# Patient Record
Sex: Male | Born: 1945
Health system: Southern US, Community
[De-identification: ages and names within clinical notes are randomized; demographics above are authoritative.]

## PROBLEM LIST (undated history)

## (undated) DIAGNOSIS — G629 Polyneuropathy, unspecified: Secondary | ICD-10-CM

## (undated) DIAGNOSIS — E871 Hypo-osmolality and hyponatremia: Secondary | ICD-10-CM

## (undated) DIAGNOSIS — I429 Cardiomyopathy, unspecified: Secondary | ICD-10-CM

## (undated) DIAGNOSIS — E119 Type 2 diabetes mellitus without complications: Secondary | ICD-10-CM

## (undated) DIAGNOSIS — K5790 Diverticulosis of intestine, part unspecified, without perforation or abscess without bleeding: Secondary | ICD-10-CM

## (undated) DIAGNOSIS — I219 Acute myocardial infarction, unspecified: Secondary | ICD-10-CM

## (undated) DIAGNOSIS — I700 Atherosclerosis of aorta: Secondary | ICD-10-CM

## (undated) DIAGNOSIS — I509 Heart failure, unspecified: Secondary | ICD-10-CM

## (undated) DIAGNOSIS — K2270 Barrett's esophagus without dysplasia: Secondary | ICD-10-CM

## (undated) DIAGNOSIS — I2510 Atherosclerotic heart disease of native coronary artery without angina pectoris: Secondary | ICD-10-CM

## (undated) DIAGNOSIS — K449 Diaphragmatic hernia without obstruction or gangrene: Secondary | ICD-10-CM

## (undated) HISTORY — DX: Cardiomyopathy, unspecified: I42.9

## (undated) HISTORY — DX: Diaphragmatic hernia without obstruction or gangrene: K44.9

## (undated) HISTORY — DX: Barrett's esophagus without dysplasia: K22.70

## (undated) HISTORY — DX: Type 2 diabetes mellitus without complications: E11.9

## (undated) HISTORY — DX: Atherosclerosis of aorta: I70.0

## (undated) HISTORY — DX: Hypo-osmolality and hyponatremia: E87.1

## (undated) HISTORY — PX: ESOPHAGOGASTRODUODENOSCOPY: SHX1529

## (undated) HISTORY — DX: Diverticulosis of intestine, part unspecified, without perforation or abscess without bleeding: K57.90

## (undated) HISTORY — PX: HERNIA REPAIR: SHX51

## (undated) HISTORY — DX: Heart failure, unspecified: I50.9

## (undated) HISTORY — DX: Polyneuropathy, unspecified: G62.9

## (undated) HISTORY — DX: Atherosclerotic heart disease of native coronary artery without angina pectoris: I25.10

## (undated) HISTORY — DX: Acute myocardial infarction, unspecified: I21.9

---

## 2017-12-07 ENCOUNTER — Emergency Department (HOSPITAL_COMMUNITY)
Admission: EM | Admit: 2017-12-07 | Discharge: 2017-12-07 | Disposition: A | Discharge status: Home / Self Care | Payer: No Typology Code available for payment source | Attending: Emergency Medicine | Admitting: Emergency Medicine

## 2017-12-07 ENCOUNTER — Other Ambulatory Visit: Payer: Self-pay

## 2017-12-07 DIAGNOSIS — W01198A Fall on same level from slipping, tripping and stumbling with subsequent striking against other object, initial encounter: Secondary | ICD-10-CM | POA: Diagnosis not present

## 2017-12-07 DIAGNOSIS — Y990 Civilian activity done for income or pay: Secondary | ICD-10-CM | POA: Insufficient documentation

## 2017-12-07 DIAGNOSIS — Y92038 Other place in apartment as the place of occurrence of the external cause: Secondary | ICD-10-CM | POA: Insufficient documentation

## 2017-12-07 DIAGNOSIS — Z23 Encounter for immunization: Secondary | ICD-10-CM | POA: Insufficient documentation

## 2017-12-07 DIAGNOSIS — W19XXXA Unspecified fall, initial encounter: Secondary | ICD-10-CM

## 2017-12-07 DIAGNOSIS — S0990XA Unspecified injury of head, initial encounter: Secondary | ICD-10-CM

## 2017-12-07 DIAGNOSIS — Y9389 Activity, other specified: Secondary | ICD-10-CM | POA: Insufficient documentation

## 2017-12-07 DIAGNOSIS — S01112A Laceration without foreign body of left eyelid and periocular area, initial encounter: Secondary | ICD-10-CM | POA: Diagnosis not present

## 2017-12-07 DIAGNOSIS — S098XXA Other specified injuries of head, initial encounter: Secondary | ICD-10-CM | POA: Diagnosis present

## 2017-12-07 MED ORDER — LIDOCAINE HCL (PF) 1 % IJ SOLN
5.0000 mL | Freq: Once | INTRAMUSCULAR | Status: AC
  Administered 2017-12-07: 5 mL
  Filled 2017-12-07: qty 30

## 2017-12-07 MED ORDER — TETANUS-DIPHTH-ACELL PERTUSSIS 5-2.5-18.5 LF-MCG/0.5 IM SUSP
0.5000 mL | Freq: Once | INTRAMUSCULAR | Status: AC
  Administered 2017-12-07: 0.5 mL via INTRAMUSCULAR
  Filled 2017-12-07: qty 0.5

## 2017-12-07 NOTE — ED Triage Notes (Signed)
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 Pt has a laceration to his left eyebrow. Pt denies LOC. PT reports no pertinent medical hx, no medications, and no allergies.  Pt reports no pain, but laceration is deep. Bleeding is controlled at this time.

## 2017-12-07 NOTE — ED Provider Notes (Signed)
[facility] DEPT Provider Note   CSN: [id number] Arrival date & time: 12/07/17  1859     History   Chief Complaint Chief Complaint  Patient presents with  . Fall    HPI Aaron Rush is a 72 y.o. male with no significant past medical history and on no daily medication who presents emergency room today for mechanical fall that occurred approximately 3 hours ago.  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  He denies any loss of consciousness.  No nausea or vomiting since the event.  Patient is not on any blood thinners.  Patient denies any headache, visual changes, facial droop, numbness/tingling/weakness of the extremities, neck pain, bowel/bladder incontinence, urinary retention, difficulty with gait.  He denies history of intracranial hemorrhage.  He is noted to have a laceration to the left upper eyebrow that is roughly 2 cm without measurement.  Bleeding is currently controlled.  He notes that he cleansed the area prior to arrival.  The area was patched with gauze.  His tetanus is not up-to-date.  HPI  No past medical history on file.  There are no active problems to display for this patient.   The histories are not reviewed yet. Please review them in the "History" navigator section and refresh this [place].     Home Medications    Prior to Admission medications   Not on File    Family History No family history on file.  Social History Social History   Tobacco Use  . Smoking status: Not on file  Substance Use Topics  . Alcohol use: Not on file  . Drug use: Not on file     Allergies   Patient has no known allergies.   Review of Systems Review of Systems  All other systems reviewed and are negative.    Physical Exam Updated Vital  Signs BP (!) 148/99 (BP Location: Left Arm)   Pulse (!) 109   Temp 98.4 F (36.9 C) (Oral)   Resp 16   Ht 5\' 11"  (1.803 m)   Wt 59 kg (130 lb)   SpO2 99%   BMI 18.13 kg/m   Physical Exam  Constitutional: He appears well-developed and well-nourished.  HENT:  Head: Normocephalic and atraumatic.  Right Ear: External ear normal.  Left Ear: External ear normal.  Patient is a 2.25 cm laceration above the left upper eyebrow.  Bleeding is currently controlled.  No raccoon eyes or battle signs.  No CSF otorrhea.  No hemotympanum.  No palpable open or depressed skull fractures.  Eyes: Conjunctivae are normal. Right eye exhibits no discharge. Left eye exhibits no discharge. No scleral icterus.  Extra ocular movements are intact without evidence of entrapment  Neck:  No C-spine tenderness palpation or step-offs.  Pulmonary/Chest: Effort normal. No respiratory distress.  Neurological: He is alert.  Mental Status:  Alert, oriented, thought content appropriate, able to give a coherent history. Speech fluent without evidence of aphasia. Able to follow 2 step commands without difficulty.  Cranial Nerves:  II:  Peripheral visual fields grossly normal, pupils equal, round, reactive to light III,IV, VI: ptosis not present, extra-ocular motions intact bilaterally  V,VII: smile symmetric, eyebrows raise symmetric, facial light touch sensation equal VIII: hearing grossly normal to voice  X: uvula elevates symmetrically  XI: bilateral shoulder shrug symmetric and strong XII: midline tongue extension without fassiculations Motor:  Normal tone. 5/5 in upper and lower extremities bilaterally including strong and equal grip strength and dorsiflexion/plantar flexion Sensory: Sensation intact to light touch in all extremities. Negative Romberg.  Deep Tendon Reflexes: 2+ and symmetric in the biceps and patella Cerebellar: normal finger-to-nose with bilateral upper extremities. Normal heel-to -shin balance  bilaterally of the lower extremity. No pronator drift.  Gait: normal gait and balance CV: distal pulses palpable throughout   Skin: Skin is warm and dry. Capillary refill takes less than 2 seconds. Laceration noted. No pallor.  Psychiatric: He has a normal mood and affect.  Nursing note and vitals reviewed.      ED Treatments / Results  Labs (all labs ordered are listed, but only abnormal results are displayed) Labs Reviewed - No data to display  EKG None  Radiology No results found.  Procedures .Marland KitchenLaceration Repair Date/Time: 12/07/2017 9:44 PM Performed by: Jillyn Ledger, PA-C Authorized by: Jillyn Ledger, PA-C   Consent:    Consent obtained:  Verbal   Consent given by:  Patient   Risks discussed:  Infection, need for additional repair, nerve damage, poor wound healing, poor cosmetic result, pain, retained foreign body, tendon damage and vascular damage   Alternatives discussed:  No treatment Anesthesia (see MAR for exact dosages):    Anesthesia method:  Local infiltration   Local anesthetic:  Lidocaine 1% w/o epi Laceration details:    Location:  Face   Face location:  L eyebrow   Length (cm):  2.3 Repair type:    Repair type:  Simple Pre-procedure details:    Preparation:  Patient was prepped and draped in usual sterile fashion Treatment:    Area cleansed with:  Saline   Amount of cleaning:  Standard   Irrigation solution:  Sterile saline   Irrigation volume:  200   Irrigation method:  Syringe   Visualized foreign bodies/material removed: no   Skin repair:    Repair method:  Sutures   Suture size:  6-0   Suture material:  Prolene   Suture technique:  Simple interrupted   Number of sutures:  3 Approximation:    Approximation:  Close Post-procedure details:    Dressing:  Open (no dressing)   Patient tolerance of procedure:  Tolerated well, no immediate complications   (including critical care time)  Medications Ordered in ED Medications    lidocaine (PF) (XYLOCAINE) 1 % injection 5 mL (has no administration in time range)  Tdap (BOOSTRIX) injection 0.5 mL (has no administration in time range)     Initial Impression / Assessment and Plan / ED Course  I have reviewed the triage vital signs and the nursing notes.  Pertinent labs & imaging results that were available during my care of the patient were reviewed by me and considered in my medical decision making (see chart for details).     72 y.o. male sitting with mechanical fall today in which she hit the left side of his head and has a 2.25 cm laceration above the left eyebrow was seen above.  Patient denies loss of consciousness.  He is without headache, double vision, nausea/vomiting, vertigo, focal weakness or confusion since the event.  He is not on any blood thinners.  He denies prior intracranial injuries.  Patient is with normal neurologic exam as above.  Do not feel patient needs CT scan in order to evaluate for intracranial pathology. Pressure irrigation performed.  Wound explored and base of wound visualized in a bloodless field without evidence of foreign body.  Laceration occurred < 8 hours prior to repair which was well tolerated.  Tdap updated. Discussed suture home care with patient and answered questions. Pt to follow-up for wound check and suture removal in 7 days; they are to return to the ED sooner for signs of infection. Pt is hemodynamically stable with no complaints prior to dc.  Specific return precautions discussed. Time was given for all questions to be answered. The patient verbalized understanding and agreement with plan. The patient appears safe for discharge home.  Patient case seen and discussed with Dr. Sherry Ruffing who is in agreement with plan.   Final Clinical Impressions(s) / ED Diagnoses   Final diagnoses:  Fall, initial encounter  Injury of head, initial encounter  Laceration of left eyebrow, initial encounter    ED Discharge Orders    None        Lorelle Gibbs 12/07/17 2148    Tegeler, Gwenyth Allegra, MD 12/08/17 [phone]

## 2017-12-07 NOTE — Discharge Instructions (Signed)
Please read and follow all provided instructions.  Your diagnoses today is a laceration. A laceration is a cut or lesion that goes through all layers of the skin and into the tissue just beneath the skin. This was repaired with 3 stitches or a tissue adhesive similar to a super glue.  Follow up with your doctor, an urgent care, or this Emergency Department for removal of your stitches in 7-10 days. Keep the wound clean and dry for the next 24 hours and leave the dressing in place. You may shower after 24 hours. Do not soak the area for long periods of times as in a bath until the sutures are removed. After 24 hours you may remove the dressing and gently clean the laceration site with antibacterial soap (i.e. Neosporin or Bacitracin) and warm water 2 times a day. Pat dry with clean towel. Do not scrub. Once the wound has healed, scarring can be minimized by covering the wound with sunscreen during the day for 1 full year.  Return instructions:  You have redness, swelling, or increasing pain in the wound.  You see a red line that goes away from the wound.  You have yellowish-white fluid (pus) coming from the wound.  You have a fever (above 100.53F) You notice a bad smell coming from the wound or dressing.  Your wound breaks open before or after sutures have been removed.  You notice something coming out of the wound such as wood or glass.  Your wound is on your hand or foot and you cannot move a finger or toe.  Your pain is not controlled with prescribed medicine.   Additional Information:  If you did not receive a tetanus shot today because you thought you were up to date, but did not recall when your last one was given, make sure to check with your primary caregiver to determine if you need one.   Your vital signs today were: BP (!) 176/99 (BP Location: Left Arm)    Pulse 97    Temp 98.4 F (36.9 C) (Oral)    Resp 15    Ht 5\' 11"  (1.803 m)    Wt 59 kg (130 lb)    SpO2 98%    BMI 18.13 kg/m  If  your blood pressure (BP) was elevated above 135/85 this visit, please have this repeated by your doctor within one month.

## 2018-08-04 DIAGNOSIS — E119 Type 2 diabetes mellitus without complications: Secondary | ICD-10-CM | POA: Diagnosis not present

## 2018-08-04 DIAGNOSIS — D649 Anemia, unspecified: Secondary | ICD-10-CM | POA: Diagnosis not present

## 2018-08-04 DIAGNOSIS — E559 Vitamin D deficiency, unspecified: Secondary | ICD-10-CM | POA: Diagnosis not present

## 2018-08-04 DIAGNOSIS — R972 Elevated prostate specific antigen [PSA]: Secondary | ICD-10-CM | POA: Diagnosis not present

## 2018-08-04 DIAGNOSIS — D519 Vitamin B12 deficiency anemia, unspecified: Secondary | ICD-10-CM | POA: Diagnosis not present

## 2018-08-04 DIAGNOSIS — E291 Testicular hypofunction: Secondary | ICD-10-CM | POA: Diagnosis not present

## 2018-09-28 DIAGNOSIS — E291 Testicular hypofunction: Secondary | ICD-10-CM | POA: Diagnosis not present

## 2018-09-28 DIAGNOSIS — R972 Elevated prostate specific antigen [PSA]: Secondary | ICD-10-CM | POA: Diagnosis not present

## 2018-12-07 DIAGNOSIS — R972 Elevated prostate specific antigen [PSA]: Secondary | ICD-10-CM | POA: Diagnosis not present

## 2018-12-07 DIAGNOSIS — E291 Testicular hypofunction: Secondary | ICD-10-CM | POA: Diagnosis not present

## 2019-02-02 DIAGNOSIS — R972 Elevated prostate specific antigen [PSA]: Secondary | ICD-10-CM | POA: Diagnosis not present

## 2019-02-02 DIAGNOSIS — E291 Testicular hypofunction: Secondary | ICD-10-CM | POA: Diagnosis not present

## 2019-05-27 DIAGNOSIS — D519 Vitamin B12 deficiency anemia, unspecified: Secondary | ICD-10-CM | POA: Diagnosis not present

## 2019-05-27 DIAGNOSIS — E559 Vitamin D deficiency, unspecified: Secondary | ICD-10-CM | POA: Diagnosis not present

## 2019-05-27 DIAGNOSIS — R972 Elevated prostate specific antigen [PSA]: Secondary | ICD-10-CM | POA: Diagnosis not present

## 2019-05-27 DIAGNOSIS — E785 Hyperlipidemia, unspecified: Secondary | ICD-10-CM | POA: Diagnosis not present

## 2019-05-27 DIAGNOSIS — E119 Type 2 diabetes mellitus without complications: Secondary | ICD-10-CM | POA: Diagnosis not present

## 2019-05-27 DIAGNOSIS — E291 Testicular hypofunction: Secondary | ICD-10-CM | POA: Diagnosis not present

## 2019-07-15 DIAGNOSIS — I639 Cerebral infarction, unspecified: Secondary | ICD-10-CM

## 2019-07-15 HISTORY — DX: Cerebral infarction, unspecified: I63.9

## 2019-08-04 DIAGNOSIS — E291 Testicular hypofunction: Secondary | ICD-10-CM | POA: Diagnosis not present

## 2019-09-29 DIAGNOSIS — E785 Hyperlipidemia, unspecified: Secondary | ICD-10-CM | POA: Diagnosis not present

## 2019-09-29 DIAGNOSIS — E291 Testicular hypofunction: Secondary | ICD-10-CM | POA: Diagnosis not present

## 2019-09-29 DIAGNOSIS — R972 Elevated prostate specific antigen [PSA]: Secondary | ICD-10-CM | POA: Diagnosis not present

## 2019-11-23 DIAGNOSIS — R972 Elevated prostate specific antigen [PSA]: Secondary | ICD-10-CM | POA: Diagnosis not present

## 2019-11-23 DIAGNOSIS — E291 Testicular hypofunction: Secondary | ICD-10-CM | POA: Diagnosis not present

## 2020-01-25 DIAGNOSIS — Z131 Encounter for screening for diabetes mellitus: Secondary | ICD-10-CM | POA: Diagnosis not present

## 2020-01-25 DIAGNOSIS — Z1159 Encounter for screening for other viral diseases: Secondary | ICD-10-CM | POA: Diagnosis not present

## 2020-01-25 DIAGNOSIS — R0602 Shortness of breath: Secondary | ICD-10-CM | POA: Diagnosis not present

## 2020-01-25 DIAGNOSIS — R5383 Other fatigue: Secondary | ICD-10-CM | POA: Diagnosis not present

## 2020-01-25 DIAGNOSIS — E7800 Pure hypercholesterolemia, unspecified: Secondary | ICD-10-CM | POA: Diagnosis not present

## 2020-01-25 DIAGNOSIS — Z0000 Encounter for general adult medical examination without abnormal findings: Secondary | ICD-10-CM | POA: Diagnosis not present

## 2020-01-25 DIAGNOSIS — M7989 Other specified soft tissue disorders: Secondary | ICD-10-CM | POA: Diagnosis not present

## 2020-01-25 DIAGNOSIS — R9431 Abnormal electrocardiogram [ECG] [EKG]: Secondary | ICD-10-CM | POA: Diagnosis not present

## 2020-01-25 DIAGNOSIS — Z79899 Other long term (current) drug therapy: Secondary | ICD-10-CM | POA: Diagnosis not present

## 2020-01-25 DIAGNOSIS — E559 Vitamin D deficiency, unspecified: Secondary | ICD-10-CM | POA: Diagnosis not present

## 2020-01-25 DIAGNOSIS — M129 Arthropathy, unspecified: Secondary | ICD-10-CM | POA: Diagnosis not present

## 2020-01-27 DIAGNOSIS — R0989 Other specified symptoms and signs involving the circulatory and respiratory systems: Secondary | ICD-10-CM | POA: Diagnosis not present

## 2020-01-27 DIAGNOSIS — M79604 Pain in right leg: Secondary | ICD-10-CM | POA: Diagnosis not present

## 2020-01-27 DIAGNOSIS — M79605 Pain in left leg: Secondary | ICD-10-CM | POA: Diagnosis not present

## 2020-02-02 DIAGNOSIS — R918 Other nonspecific abnormal finding of lung field: Secondary | ICD-10-CM | POA: Diagnosis not present

## 2020-02-02 DIAGNOSIS — E7800 Pure hypercholesterolemia, unspecified: Secondary | ICD-10-CM | POA: Diagnosis not present

## 2020-02-02 DIAGNOSIS — E559 Vitamin D deficiency, unspecified: Secondary | ICD-10-CM | POA: Diagnosis not present

## 2020-02-02 DIAGNOSIS — I5021 Acute systolic (congestive) heart failure: Secondary | ICD-10-CM | POA: Diagnosis not present

## 2020-02-16 DIAGNOSIS — I5021 Acute systolic (congestive) heart failure: Secondary | ICD-10-CM | POA: Diagnosis not present

## 2020-02-16 DIAGNOSIS — M7989 Other specified soft tissue disorders: Secondary | ICD-10-CM | POA: Diagnosis not present

## 2020-02-16 DIAGNOSIS — R918 Other nonspecific abnormal finding of lung field: Secondary | ICD-10-CM | POA: Diagnosis not present

## 2020-02-17 DIAGNOSIS — R972 Elevated prostate specific antigen [PSA]: Secondary | ICD-10-CM | POA: Diagnosis not present

## 2020-02-17 DIAGNOSIS — E291 Testicular hypofunction: Secondary | ICD-10-CM | POA: Diagnosis not present

## 2020-02-23 DIAGNOSIS — R918 Other nonspecific abnormal finding of lung field: Secondary | ICD-10-CM | POA: Diagnosis not present

## 2020-03-08 DIAGNOSIS — I429 Cardiomyopathy, unspecified: Secondary | ICD-10-CM | POA: Diagnosis not present

## 2020-03-08 DIAGNOSIS — J90 Pleural effusion, not elsewhere classified: Secondary | ICD-10-CM | POA: Diagnosis not present

## 2020-03-23 DIAGNOSIS — I429 Cardiomyopathy, unspecified: Secondary | ICD-10-CM | POA: Diagnosis not present

## 2020-03-23 DIAGNOSIS — I5021 Acute systolic (congestive) heart failure: Secondary | ICD-10-CM | POA: Diagnosis not present

## 2020-04-05 DIAGNOSIS — J90 Pleural effusion, not elsewhere classified: Secondary | ICD-10-CM | POA: Diagnosis not present

## 2020-04-05 DIAGNOSIS — I429 Cardiomyopathy, unspecified: Secondary | ICD-10-CM | POA: Diagnosis not present

## 2020-04-11 DIAGNOSIS — E039 Hypothyroidism, unspecified: Secondary | ICD-10-CM | POA: Diagnosis not present

## 2020-04-11 DIAGNOSIS — E119 Type 2 diabetes mellitus without complications: Secondary | ICD-10-CM | POA: Diagnosis not present

## 2020-04-11 DIAGNOSIS — Z131 Encounter for screening for diabetes mellitus: Secondary | ICD-10-CM | POA: Diagnosis not present

## 2020-04-11 DIAGNOSIS — R972 Elevated prostate specific antigen [PSA]: Secondary | ICD-10-CM | POA: Diagnosis not present

## 2020-04-11 DIAGNOSIS — D519 Vitamin B12 deficiency anemia, unspecified: Secondary | ICD-10-CM | POA: Diagnosis not present

## 2020-04-11 DIAGNOSIS — R861 Abnormal level of hormones in specimens from male genital organs: Secondary | ICD-10-CM | POA: Diagnosis not present

## 2020-04-11 DIAGNOSIS — E559 Vitamin D deficiency, unspecified: Secondary | ICD-10-CM | POA: Diagnosis not present

## 2020-04-11 DIAGNOSIS — E291 Testicular hypofunction: Secondary | ICD-10-CM | POA: Diagnosis not present

## 2020-04-17 DIAGNOSIS — I5021 Acute systolic (congestive) heart failure: Secondary | ICD-10-CM | POA: Diagnosis not present

## 2020-04-17 DIAGNOSIS — Z23 Encounter for immunization: Secondary | ICD-10-CM | POA: Diagnosis not present

## 2020-04-17 DIAGNOSIS — Z79899 Other long term (current) drug therapy: Secondary | ICD-10-CM | POA: Diagnosis not present

## 2020-04-17 DIAGNOSIS — Z131 Encounter for screening for diabetes mellitus: Secondary | ICD-10-CM | POA: Diagnosis not present

## 2020-04-17 DIAGNOSIS — E7800 Pure hypercholesterolemia, unspecified: Secondary | ICD-10-CM | POA: Diagnosis not present

## 2020-05-07 DIAGNOSIS — R29898 Other symptoms and signs involving the musculoskeletal system: Secondary | ICD-10-CM | POA: Diagnosis not present

## 2020-05-07 DIAGNOSIS — J90 Pleural effusion, not elsewhere classified: Secondary | ICD-10-CM | POA: Diagnosis not present

## 2020-05-07 DIAGNOSIS — I429 Cardiomyopathy, unspecified: Secondary | ICD-10-CM | POA: Diagnosis not present

## 2020-05-10 ENCOUNTER — Encounter: Payer: Self-pay | Admitting: Neurology

## 2020-05-17 ENCOUNTER — Encounter (HOSPITAL_BASED_OUTPATIENT_CLINIC_OR_DEPARTMENT_OTHER): Payer: Self-pay | Admitting: *Deleted

## 2020-05-17 ENCOUNTER — Emergency Department (HOSPITAL_BASED_OUTPATIENT_CLINIC_OR_DEPARTMENT_OTHER): Payer: Medicare HMO

## 2020-05-17 ENCOUNTER — Emergency Department (HOSPITAL_BASED_OUTPATIENT_CLINIC_OR_DEPARTMENT_OTHER)
Admission: EM | Admit: 2020-05-17 | Discharge: 2020-05-17 | Disposition: A | Discharge status: Home / Self Care | Payer: Medicare HMO | Attending: Emergency Medicine | Admitting: Emergency Medicine

## 2020-05-17 ENCOUNTER — Other Ambulatory Visit: Payer: Self-pay

## 2020-05-17 DIAGNOSIS — R911 Solitary pulmonary nodule: Secondary | ICD-10-CM | POA: Diagnosis not present

## 2020-05-17 DIAGNOSIS — R4182 Altered mental status, unspecified: Secondary | ICD-10-CM | POA: Diagnosis not present

## 2020-05-17 DIAGNOSIS — N390 Urinary tract infection, site not specified: Secondary | ICD-10-CM

## 2020-05-17 DIAGNOSIS — K209 Esophagitis, unspecified without bleeding: Secondary | ICD-10-CM | POA: Diagnosis not present

## 2020-05-17 DIAGNOSIS — R1110 Vomiting, unspecified: Secondary | ICD-10-CM | POA: Diagnosis not present

## 2020-05-17 DIAGNOSIS — I639 Cerebral infarction, unspecified: Secondary | ICD-10-CM | POA: Diagnosis not present

## 2020-05-17 DIAGNOSIS — R69 Illness, unspecified: Secondary | ICD-10-CM | POA: Diagnosis not present

## 2020-05-17 DIAGNOSIS — R140 Abdominal distension (gaseous): Secondary | ICD-10-CM | POA: Diagnosis not present

## 2020-05-17 DIAGNOSIS — F0390 Unspecified dementia without behavioral disturbance: Secondary | ICD-10-CM | POA: Insufficient documentation

## 2020-05-17 DIAGNOSIS — R9431 Abnormal electrocardiogram [ECG] [EKG]: Secondary | ICD-10-CM | POA: Diagnosis not present

## 2020-05-17 DIAGNOSIS — R531 Weakness: Secondary | ICD-10-CM | POA: Diagnosis not present

## 2020-05-17 DIAGNOSIS — B9689 Other specified bacterial agents as the cause of diseases classified elsewhere: Secondary | ICD-10-CM | POA: Diagnosis not present

## 2020-05-17 DIAGNOSIS — K449 Diaphragmatic hernia without obstruction or gangrene: Secondary | ICD-10-CM | POA: Insufficient documentation

## 2020-05-17 DIAGNOSIS — I700 Atherosclerosis of aorta: Secondary | ICD-10-CM | POA: Diagnosis not present

## 2020-05-17 DIAGNOSIS — R630 Anorexia: Secondary | ICD-10-CM | POA: Diagnosis not present

## 2020-05-17 DIAGNOSIS — I509 Heart failure, unspecified: Secondary | ICD-10-CM | POA: Insufficient documentation

## 2020-05-17 DIAGNOSIS — Z87891 Personal history of nicotine dependence: Secondary | ICD-10-CM | POA: Insufficient documentation

## 2020-05-17 DIAGNOSIS — J984 Other disorders of lung: Secondary | ICD-10-CM | POA: Diagnosis not present

## 2020-05-17 DIAGNOSIS — R42 Dizziness and giddiness: Secondary | ICD-10-CM | POA: Diagnosis not present

## 2020-05-17 LAB — CBC WITH DIFFERENTIAL/PLATELET
Abs Immature Granulocytes: 0.03 10*3/uL (ref 0.00–0.07)
Basophils Absolute: 0 10*3/uL (ref 0.0–0.1)
Basophils Relative: 0 %
Eosinophils Absolute: 0.1 10*3/uL (ref 0.0–0.5)
Eosinophils Relative: 1 %
HCT: 44.6 % (ref 39.0–52.0)
Hemoglobin: 15.6 g/dL (ref 13.0–17.0)
Immature Granulocytes: 0 %
Lymphocytes Relative: 15 %
Lymphs Abs: 1.3 10*3/uL (ref 0.7–4.0)
MCH: 33.8 pg (ref 26.0–34.0)
MCHC: 35 g/dL (ref 30.0–36.0)
MCV: 96.7 fL (ref 80.0–100.0)
Monocytes Absolute: 0.8 10*3/uL (ref 0.1–1.0)
Monocytes Relative: 9 %
Neutro Abs: 6.6 10*3/uL (ref 1.7–7.7)
Neutrophils Relative %: 75 %
Platelets: 221 10*3/uL (ref 150–400)
RBC: 4.61 MIL/uL (ref 4.22–5.81)
RDW: 13.7 % (ref 11.5–15.5)
WBC: 8.9 10*3/uL (ref 4.0–10.5)
nRBC: 0 % (ref 0.0–0.2)

## 2020-05-17 LAB — URINALYSIS, ROUTINE W REFLEX MICROSCOPIC
Bilirubin Urine: NEGATIVE
Glucose, UA: NEGATIVE mg/dL
Ketones, ur: NEGATIVE mg/dL
Nitrite: NEGATIVE
Protein, ur: NEGATIVE mg/dL
Specific Gravity, Urine: 1.01 (ref 1.005–1.030)
pH: 6.5 (ref 5.0–8.0)

## 2020-05-17 LAB — COMPREHENSIVE METABOLIC PANEL
ALT: 19 U/L (ref 0–44)
AST: 19 U/L (ref 15–41)
Albumin: 3.6 g/dL (ref 3.5–5.0)
Alkaline Phosphatase: 63 U/L (ref 38–126)
Anion gap: 9 (ref 5–15)
BUN: 16 mg/dL (ref 8–23)
CO2: 23 mmol/L (ref 22–32)
Calcium: 8.9 mg/dL (ref 8.9–10.3)
Chloride: 104 mmol/L (ref 98–111)
Creatinine, Ser: 0.9 mg/dL (ref 0.61–1.24)
GFR, Estimated: >60 mL/min (ref >60)
Glucose, Bld: 156 mg/dL — ABNORMAL HIGH (ref 70–99)
Potassium: 3.6 mmol/L (ref 3.5–5.1)
Sodium: 136 mmol/L (ref 135–145)
Total Bilirubin: 1.2 mg/dL (ref 0.3–1.2)
Total Protein: 6.7 g/dL (ref 6.5–8.1)

## 2020-05-17 LAB — TSH: TSH: 3.257 u[IU]/mL (ref 0.350–4.500)

## 2020-05-17 LAB — CK: Total CK: 88 U/L (ref 49–397)

## 2020-05-17 LAB — URINALYSIS, MICROSCOPIC (REFLEX): WBC, UA: >50 WBC/hpf (ref 0–5)

## 2020-05-17 LAB — TROPONIN I (HIGH SENSITIVITY): Troponin I (High Sensitivity): 10 ng/L (ref <18)

## 2020-05-17 MED ORDER — IOHEXOL 350 MG/ML SOLN
100.0000 mL | Freq: Once | INTRAVENOUS | Status: AC | PRN
  Administered 2020-05-17: 100 mL via INTRAVENOUS

## 2020-05-17 MED ORDER — SODIUM CHLORIDE 0.9 % IV SOLN
1.0000 g | Freq: Once | INTRAVENOUS | Status: AC
  Administered 2020-05-17: 1 g via INTRAVENOUS
  Filled 2020-05-17: qty 10

## 2020-05-17 MED ORDER — CEPHALEXIN 500 MG PO CAPS: 500.0000 mg | ORAL_CAPSULE | Freq: Three times a day (TID) | ORAL | 0 refills | Status: DC

## 2020-05-17 MED ORDER — PANTOPRAZOLE SODIUM 40 MG PO TBEC
40.0000 mg | DELAYED_RELEASE_TABLET | Freq: Once | ORAL | Status: AC
  Administered 2020-05-17: 40 mg via ORAL
  Filled 2020-05-17: qty 1

## 2020-05-17 MED ORDER — SODIUM CHLORIDE 0.9 % IV BOLUS
1000.0000 mL | Freq: Once | INTRAVENOUS | Status: AC
  Administered 2020-05-17: 1000 mL via INTRAVENOUS

## 2020-05-17 MED ORDER — PANTOPRAZOLE SODIUM 20 MG PO TBEC: 20.0000 mg | DELAYED_RELEASE_TABLET | Freq: Every day | ORAL | 0 refills | Status: DC

## 2020-05-17 NOTE — ED Notes (Signed)
Pt on monitor 

## 2020-05-17 NOTE — ED Triage Notes (Signed)
C/o generalized weakness x 4 months

## 2020-05-17 NOTE — ED Notes (Signed)
Discharge instructions discussed with patient and his wife. Both verbalize understanding of follow up care and prescriptions. Depart ED at this time in stable condition.

## 2020-05-17 NOTE — ED Notes (Signed)
Patient transported to CT 

## 2020-05-17 NOTE — ED Provider Notes (Signed)
[facility] EMERGENCY DEPARTMENT Provider Note   CSN: [id number] Arrival date & time: 05/17/20  1557     History Chief Complaint  Patient presents with  . Weakness    Champion Corales is a 74 y.o. male hx CHF here presenting with diffuse weakness.  Patient states that he has been progressively weaker for the last 4 months or so.  He states that he started with just some mild weakness in his legs now progressed to the point that he has to walk with walkers.  He states that he was afraid of driving yesterday because of his lack of coordination. He denies any falls or head injury.  Denies any trouble speaking or arm weakness.  Patient has about a 10 pound weight loss as well.  He states that he just has poor appetite as well.  Patient saw cardiology several months ago and was told he has heart failure and was briefly on Lasix but no longer on diuretics.  Patient saw his primary care doctor today and was sent in for further evaluation.  Patient of note has an appointment with neurology in 2 weeks.  The history is provided by the patient.       History reviewed. No pertinent past medical history.  There are no problems to display for this patient.   Past Surgical History:  Procedure Laterality Date  . HERNIA REPAIR         No family history on file.  Social History   Tobacco Use  . Smoking status: Former Smoker  Substance Use Topics  . Alcohol use: Not Currently    Comment: stopped 1982  . Drug use: Not Currently    Home Medications Prior to Admission medications   Medication Sig Start Date End Date Taking? Authorizing Provider  carvedilol (COREG) 3.125 MG tablet Take 3.125 mg by mouth 2 (two) times daily. 03/08/20   [provider]  [employer] CALCIUM 600 MG tablet Take 1 tablet by mouth 2 (two) times daily. 04/05/20   [provider]  ENTRESTO 24-26 MG Take 1 tablet by mouth 2 (two) times daily. 05/14/20   [provider]  furosemide (LASIX)  20 MG tablet Take 20 mg by mouth daily. 04/05/20   [provider]  potassium chloride (KLOR-CON) 10 MEQ tablet Take 10 mEq by mouth 2 (two) times daily. 03/01/20   [provider]    Allergies    Patient has no known allergies.  Review of Systems   Review of Systems  Neurological: Positive for weakness.  All other systems reviewed and are negative.   Physical Exam Updated Vital Signs BP (!) 157/105 (BP Location: Right Arm)   Pulse 90   Temp 98 F (36.7 C) (Oral)   Resp 16   Ht 5\' 11"  (1.803 m)   Wt 59 kg   SpO2 100%   BMI 18.13 kg/m   Physical Exam Vitals and nursing note reviewed.  Constitutional:      Appearance: Normal appearance.     Comments: Chronically ill  HENT:     Head: Normocephalic.     Right Ear: Tympanic membrane normal.     Left Ear: Tympanic membrane normal.     Nose: Nose normal.     Mouth/Throat:     Mouth: Mucous membranes are moist.  Eyes:     Extraocular Movements: Extraocular movements intact.     Pupils: Pupils are equal, round, and reactive to light.  Cardiovascular:     Rate and Rhythm:  Normal rate and regular rhythm.     Pulses: Normal pulses.     Heart sounds: Normal heart sounds.  Pulmonary:     Effort: Pulmonary effort is normal.     Breath sounds: Normal breath sounds.  Abdominal:     General: Abdomen is flat.     Palpations: Abdomen is soft.  Musculoskeletal:        General: Normal range of motion.     Cervical back: Normal range of motion and neck supple.     Comments: Bilateral calves with some muscle atrophy.  No calf tenderness.  Skin:    General: Skin is warm.     Capillary Refill: Capillary refill takes less than 2 seconds.  Neurological:     General: No focal deficit present.     Mental Status: He is alert.     Comments: Strength is 4 out of 5 bilateral hip flexion extension and knee flexion and extension.  Patient has normal reflexes in the knees.  Patient has normal sensation throughout.  Patient has  normal arm strength bilaterally.  There is no facial droop.  Psychiatric:        Mood and Affect: Mood normal.        Behavior: Behavior normal.     ED Results / Procedures / Treatments   Labs (all labs ordered are listed, but only abnormal results are displayed) Labs Reviewed  CBC WITH DIFFERENTIAL/PLATELET  COMPREHENSIVE METABOLIC PANEL  URINALYSIS, ROUTINE W REFLEX MICROSCOPIC  TSH  CK  TROPONIN I (HIGH SENSITIVITY)    EKG EKG Interpretation  Date/Time:  Thursday May 17 2020 16:10:53 EDT Ventricular Rate:  87 PR Interval:    QRS Duration: 90 QT Interval:  360 QTC Calculation: 433 R Axis:   65 Text Interpretation: Sinus rhythm LAE, consider biatrial enlargement Left ventricular hypertrophy Anterior infarct, old Nonspecific T abnormalities, lateral leads No previous ECGs available Confirmed by Wandra Arthurs [phone]) on 05/17/2020 4:30:42 PM   Radiology No results found.  Procedures Procedures (including critical care time)  Medications Ordered in ED Medications  sodium chloride 0.9 % bolus 1,000 mL (has no administration in time range)    ED Course  I have reviewed the triage vital signs and the nursing notes.  Pertinent labs & imaging results that were available during my care of the patient were reviewed by me and considered in my medical decision making (see chart for details).    MDM Rules/Calculators/A&P                         Leamon Palau is a 74 y.o. male here presenting with weakness.  Progressively over the last 4 months or so.  Is very difficult to pinpoint exactly what is going on.  Consider malignancy versus dementia versus electrolyte abnormalities.  Patient is scheduled to see neurology in several weeks.  Since this is progressively over last several months, Guillain-Barr syndrome is less likely.  Plan to get CBC CMP and CT head and chest doing urinalysis to look for medical causes.  Patient may need nerve conduction testing and MRI with  neurology outpatient.    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  Given time course of 4 months it is likely chronic.  Chest x-ray also shows possible nodule.  CTA head and neck was unremarkable and CT chest and pelvis showed large hiatal hernia and some esophagitis.  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  Patient has multiple pulmonary nodules.  Unclear if he has a primary cancer that is causing his poor appetite or he just has bad esophagitis.  Finally, patient also has a UTI which can cause him to be weak as well.  Will discharge patient home with some Protonix as well as course of Keflex.  Will refer patient to GI and neurology outpatient.  Final Clinical Impression(s) / ED Diagnoses Final diagnoses:  None    Rx / DC Orders ED Discharge Orders    None       Drenda Freeze, MD 05/17/20 2107

## 2020-05-17 NOTE — Discharge Instructions (Signed)
Take Keflex 3 times daily for a week for UTI.  You also have esophagitis so please take Protonix 20 mg daily.  Your CT showed hiatal hernia as well as inflammation in her esophagus. Please see GI doctor.  You also have lung nodules that need to be followed up.  Your CT also showed previous stroke and please follow-up with neurology  Return to ER if you have worse weakness, trouble walking, trouble speaking.

## 2020-05-19 LAB — URINE CULTURE: Culture: >=100000 — AB

## 2020-05-20 ENCOUNTER — Telehealth (HOSPITAL_BASED_OUTPATIENT_CLINIC_OR_DEPARTMENT_OTHER): Payer: Self-pay | Admitting: Emergency Medicine

## 2020-05-20 NOTE — Telephone Encounter (Signed)
Post ED Visit - Positive Culture Follow-up  Culture report reviewed by antimicrobial stewardship pharmacist: [place] Team []  Elenor Quinones, Pharm.D. []  Heide Guile, Pharm.D., BCPS AQ-ID []  Parks Neptune, Pharm.D., BCPS []  Alycia Rossetti, Pharm.D., BCPS []  [place], Pharm.D., BCPS, AAHIVP []  Legrand Como, Pharm.D., BCPS, AAHIVP []  Salome Arnt, PharmD, BCPS []  Johnnette Gourd, PharmD, BCPS []  Hughes Better, PharmD, BCPS [x]  Duanne Limerick, PharmD []  Laqueta Linden, PharmD, BCPS []  Albertina Parr, PharmD  [place] Team []  Leodis Sias, PharmD []  Lindell Spar, PharmD []  Royetta Asal, PharmD []  Graylin Shiver, Rph []  Rema Fendt) Glennon Mac, PharmD []  Arlyn Dunning, PharmD []  Netta Cedars, PharmD []  Dia Sitter, PharmD []  Leone Haven, PharmD []  Gretta Arab, PharmD []  Theodis Shove, PharmD []  Peggyann Juba, PharmD []  Reuel Boom, PharmD   Positive urine culture Treated with Cephalexin, organism sensitive to the same and no further patient follow-up is required at this time.  Milus Mallick 05/20/2020, 5:39 PM

## 2020-05-22 ENCOUNTER — Ambulatory Visit: Payer: Medicare HMO | Admitting: Gastroenterology

## 2020-05-22 ENCOUNTER — Encounter: Payer: Self-pay | Admitting: Gastroenterology

## 2020-05-22 VITALS — BP 100/68 | HR 82 | Ht 71.0 in | Wt 129.0 lb

## 2020-05-22 DIAGNOSIS — R531 Weakness: Secondary | ICD-10-CM | POA: Diagnosis not present

## 2020-05-22 DIAGNOSIS — R933 Abnormal findings on diagnostic imaging of other parts of digestive tract: Secondary | ICD-10-CM | POA: Diagnosis not present

## 2020-05-22 DIAGNOSIS — Z1211 Encounter for screening for malignant neoplasm of colon: Secondary | ICD-10-CM

## 2020-05-22 DIAGNOSIS — R634 Abnormal weight loss: Secondary | ICD-10-CM | POA: Diagnosis not present

## 2020-05-22 NOTE — Patient Instructions (Addendum)
If you are age 72 or older, your body mass index should be between 23-30. Your Body mass index is 17.99 kg/m. If this is out of the aforementioned range listed, please consider follow up with your Primary Care Provider.  If you are age 56 or younger, your body mass index should be between 19-25. Your Body mass index is 17.99 kg/m. If this is out of the aformentioned range listed, please consider follow up with your Primary Care Provider.   Please continue your Protonix.   We will request your record from [place] office at [facility] (latest ECHO and last OV)   Please have Dr.Patel forward notes from your upcoming office visit on 05-24-20: [phone].  Once all records have been reviewed we will determine the timing of a Colonoscopy and Endoscopy.   Thank you for entrusting me with your care and for choosing [facility], Dr. [place]

## 2020-05-22 NOTE — Progress Notes (Signed)
HPI :  74 year old male with a history of CAD, CHF, recent history of CVA, referred by the emergency department, Dr. Shirlyn Goltz for abnormal CT imaging of the GI tract.  Patient and wife state that they went to the emergency room on November 4 as the patient was experiencing progressive weakness of his lower extremities.  Wife states he went there " to get a neurology consult".  He has had progressive lower extremity weakness for the past 4 months or so.  Previously he worked as a Animal nutritionist and had no problems ambulating.  Now he cannot walk alone or even with a cane, dependent on a walker and has significant lower extremity weakness.  He states " every day he feels worse".  At the time of the ED visit he had also endorsed 30 pounds of weight loss over the past year or so.  He had extensive imaging done in the ER to include CT scan of the head, CT scan of the chest abdomen pelvis, CTA of the neck with and without contrast.  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  He had very mild dilation of the main pericarotid duct to 4 to 5 mm in diameter.  No pancreatic mass lesions.  Some apical pulmonary nodules, small angiomyolipoma of the left kidney.  He was referred by the emergency room given the findings of hiatal hernia and mild esophagitis.  He was empirically placed on Protonix 20 mg a day.  He denies any reflux symptoms of bother him.  He denies any regurgitation, no nausea, no vomiting.  No abdominal pains.  No dysphagia.  No bowel problems.  No blood in his stools.  He states he previously had a poor appetite but has been eating much better lately.  He does have some hiccups for the past year which bother him intermittently.  He has never had a prior colonoscopy.  He follows chronically with cardiology, states he has a history of CAD and CHF, on Entresto.  No  records of his cardiology evaluation on file, unclear what his ejection fraction is.  His main complaint is progressive weakness of his lower legs and inability to walk.  He stopped working due to this.  He denies any numbness or paresthesias.  He has full function of his arms and normal strength of his arms.  Weakness is from the knees down bilaterally.  He is scheduled to see neurology on Thursday.   CT abdomen / pelvis 05/17/20 - IMPRESSION: 1. Large hiatal hernia. Mild circumferential distal esophageal thickening, may represent esophagitis. 2. Potential fat containing lesion in the LEFT kidney may represent a small angiomyolipoma. Consider follow-up noncontrast CT on a nonemergent basis for complete characterization. 3. Mild dilation of the main pancreatic duct in the head of the pancreas with smooth tapering distally, correlate with current evidence of or prior history of pancreatitis. No peripancreatic stranding on today's study. 4-45mm main dilation of PD 4. Apical pulmonary nodules in the setting of pleural and parenchymal scarring are of uncertain significance. Largest approximately 8 mm. Non-contrast chest CT at 3-6 months is recommended. If the nodules are stable at time of repeat CT, then future CT at 18-24 months (from today's scan) is considered optional for low-risk patients, but is recommended for high-risk patients. This recommendation follows the consensus statement: Guidelines for Management of Incidental Pulmonary Nodules Detected on CT Images: From the  Fleischner Society 2017; Radiology 2017; 284:228-243. 5. Normal appendix. 6. Calcified coronary artery disease. 7. Aortic atherosclerosis.  Aortic Atherosclerosis (ICD10-I70.0).  CT head - 05/17/20 - IMPRESSION: Small infarcts within the bilateral cerebellar hemispheres are favored subacute or chronic given the degree of hypodensity. Brain MRI may be obtained for confirmation, as clinically warranted.  Otherwise  unremarkable non-contrast CT appearance of the brain for Age.     Past Medical History:  Diagnosis Date  . Aortic atherosclerosis ([place])   . CAD (coronary artery disease)   . Cardiomyopathy ([place])   . CHF (congestive heart failure) ([place])   . Diverticulosis   . Hiatal hernia   . 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 HERNIA REPAIR     Family History  Problem Relation Age of Onset  . Colon cancer Neg Hx   . Esophageal cancer Neg Hx   . Stomach cancer Neg Hx    Social History   Tobacco Use  . Smoking status: Never Smoker  . Smokeless tobacco: Never Used  Substance Use Topics  . Alcohol use: Never    Comment: stopped 1982  . Drug use: Never   Current Outpatient Medications  Medication Sig Dispense Refill  . carvedilol (COREG) 3.125 MG tablet Take 3.125 mg by mouth 2 (two) times daily.    . cephALEXin (KEFLEX) 500 MG capsule Take 1 capsule (500 mg total) by mouth 3 (three) times daily. 21 capsule 0  . [employer] CALCIUM 600 MG tablet Take 1 tablet by mouth 2 (two) times daily.    Marland Kitchen ENTRESTO 24-26 MG Take 1 tablet by mouth 2 (two) times daily.    . pantoprazole (PROTONIX) 20 MG tablet Take 1 tablet (20 mg total) by mouth daily. 30 tablet 0  . spironolactone (ALDACTONE) 25 MG tablet Take 25 mg by mouth daily.     No current facility-administered medications for this visit.   No Known Allergies   Review of Systems: All systems reviewed and negative except where noted in HPI.    CT Angio Head W or Wo Contrast  Result Date: 05/17/2020 CLINICAL DATA:  Dizziness EXAM: CT ANGIOGRAPHY HEAD AND NECK TECHNIQUE: Multidetector CT imaging of the head and neck was performed using the standard protocol during bolus administration of intravenous contrast. Multiplanar CT image reconstructions and MIPs were obtained to evaluate the vascular anatomy. Carotid stenosis measurements (when applicable) are obtained  utilizing NASCET criteria, using the distal internal carotid diameter as the denominator. CONTRAST:  136mL OMNIPAQUE IOHEXOL 350 MG/ML SOLN COMPARISON:  05/17/2020 head CT. FINDINGS: CTA NECK FINDINGS Aortic arch: Standard branching. Patent great vessel origins. Minimal arch atherosclerotic calcifications. Right carotid system: No evidence of dissection, stenosis (50% or greater) or occlusion. Left carotid system: No evidence of dissection, stenosis (50% or greater) or occlusion. Vertebral arteries: Codominant. No evidence of dissection, stenosis (50% or greater) or occlusion. Skeleton: No acute finding.  Multilevel spondylosis. Other neck: No adenopathy.  No soft tissue mass. Upper chest: Mild emphysematous changes. Dependent atelectasis. Biapical pleuroparenchymal scarring. Review of the MIP images confirms the above findings CTA HEAD FINDINGS Anterior circulation: No significant stenosis, proximal occlusion, aneurysm, or vascular malformation. Posterior circulation: No significant stenosis, proximal occlusion, aneurysm, or vascular malformation. Venous sinuses: As permitted by contrast timing, patent. Anatomic variants: Fetal origin of the bilateral PCAs. Review of the MIP images confirms the above findings IMPRESSION: No large vessel occlusion, high-grade narrowing, dissection  or aneurysm. Electronically Signed   By: Primitivo Gauze M.D.   On: 05/17/2020 20:02   DG Chest 2 View  Result Date: 05/17/2020 CLINICAL DATA:  Weakness EXAM: CHEST - 2 VIEW COMPARISON:  None. FINDINGS: The heart size and mediastinal contours are within normal limits. Ill-defined rounded airspace opacity measuring 2 cm seen at the right lower lung. The left lung is clear. There are bilateral healed fracture deformities seen within the mid chest. No acute osseous abnormality. IMPRESSION: 2 cm rounded ill-defined airspace opacity at the right lung base which may be focal airspace consolidation versus overlap of shadows. If further  evaluation is required would recommend repeat radiograph with nipple markers. Electronically Signed   By: Prudencio Pair M.D.   On: 05/17/2020 17:31   CT Head Wo Contrast  Result Date: 05/17/2020 CLINICAL DATA:  Mental status change, unknown cause. Weakness. Additional history provided: Generalized weakness for 4 months. EXAM: CT HEAD WITHOUT CONTRAST TECHNIQUE: Contiguous axial images were obtained from the base of the skull through the vertex without intravenous contrast. COMPARISON:  No pertinent prior exams are available for comparison. FINDINGS: Brain: Cerebral volume is normal for age. Small infarcts within the bilateral cerebellar hemispheres which are favored subacute or chronic given the degree of hypodensity. No demarcated cortical infarct. No extra-axial fluid collection. No evidence of intracranial mass. No midline shift. Vascular: No hyperdense vessel. Skull: Normal. Negative for fracture or focal lesion. Sinuses/Orbits: Visualized orbits show no acute finding. No significant paranasal sinus disease. IMPRESSION: Small infarcts within the bilateral cerebellar hemispheres are favored subacute or chronic given the degree of hypodensity. Brain MRI may be obtained for confirmation, as clinically warranted. Otherwise unremarkable non-contrast CT appearance of the brain for age. Electronically Signed   By: Kellie Simmering DO   On: 05/17/2020 17:22   CT Angio Neck W and/or Wo Contrast  Result Date: 05/17/2020 CLINICAL DATA:  Dizziness EXAM: CT ANGIOGRAPHY HEAD AND NECK TECHNIQUE: Multidetector CT imaging of the head and neck was performed using the standard protocol during bolus administration of intravenous contrast. Multiplanar CT image reconstructions and MIPs were obtained to evaluate the vascular anatomy. Carotid stenosis measurements (when applicable) are obtained utilizing NASCET criteria, using the distal internal carotid diameter as the denominator. CONTRAST:  166mL OMNIPAQUE IOHEXOL 350 MG/ML SOLN  COMPARISON:  05/17/2020 head CT. FINDINGS: CTA NECK FINDINGS Aortic arch: Standard branching. Patent great vessel origins. Minimal arch atherosclerotic calcifications. Right carotid system: No evidence of dissection, stenosis (50% or greater) or occlusion. Left carotid system: No evidence of dissection, stenosis (50% or greater) or occlusion. Vertebral arteries: Codominant. No evidence of dissection, stenosis (50% or greater) or occlusion. Skeleton: No acute finding.  Multilevel spondylosis. Other neck: No adenopathy.  No soft tissue mass. Upper chest: Mild emphysematous changes. Dependent atelectasis. Biapical pleuroparenchymal scarring. Review of the MIP images confirms the above findings CTA HEAD FINDINGS Anterior circulation: No significant stenosis, proximal occlusion, aneurysm, or vascular malformation. Posterior circulation: No significant stenosis, proximal occlusion, aneurysm, or vascular malformation. Venous sinuses: As permitted by contrast timing, patent. Anatomic variants: Fetal origin of the bilateral PCAs. Review of the MIP images confirms the above findings IMPRESSION: No large vessel occlusion, high-grade narrowing, dissection or aneurysm. Electronically Signed   By: Primitivo Gauze M.D.   On: 05/17/2020 20:02   CT Chest W Contrast  Result Date: 05/17/2020 CLINICAL DATA:  Abdominal distension nausea, vomiting weakness and weight loss. EXAM: CT CHEST, ABDOMEN, AND PELVIS WITH CONTRAST TECHNIQUE: Multidetector CT imaging of the chest, abdomen  and pelvis was performed following the standard protocol during bolus administration of intravenous contrast. CONTRAST:  136mL OMNIPAQUE IOHEXOL 350 MG/ML SOLN COMPARISON:  None FINDINGS: CT CHEST FINDINGS Cardiovascular: Heart size is normal. Calcified coronary artery disease. Normal venous phase appearance of central pulmonary vasculature. Calcified and noncalcified atheromatous plaque of the thoracic aorta. Mediastinum/Nodes: Thoracic inlet structures  are normal. No axillary lymphadenopathy. No mediastinal adenopathy. No hilar lymphadenopathy. Lungs/Pleura: Biapical pleural and parenchymal scarring. No consolidation. No pleural effusion. Airways are patent. 8 mm nodule at the LEFT lung apex (image 26 of series 7) 7 mm nodule at the LEFT lung apex (image 33, series 7) Musculoskeletal: No chest wall mass. Signs of prior trauma to the RIGHT posterior ribs with chronic, healed rib fractures of posterior ribs 6 through 8. Also with similar pattern on the LEFT with healed posterolateral rib fractures. No destructive bone process. See below for full musculoskeletal detail. CT ABDOMEN PELVIS FINDINGS Hepatobiliary: Normal appearance of the liver. No pericholecystic stranding. No biliary duct dilation. Pancreas: 4-5 mm dilation of the main pancreatic duct, smoothly contoured an without visible pancreatic lesion. Spleen: Normal Adrenals/Urinary Tract: Adrenal glands are normal. Symmetric renal enhancement. No hydronephrosis. Urinary bladder grossly unremarkable. Low-density lesion in the upper pole the LEFT kidney measures approximately 9 mm and is likely a small cyst. Assessment of the kidneys is markedly limited due to beam hardening artifact. There is an area of low attenuation that passes through the interpolar LEFT kidney in area of marked artifact that shows fat density measurements measuring approximately 1.5 x 1.5 cm. Stomach/Bowel: Large hiatal hernia. Mild circumferential distal esophageal thickening. No acute gastrointestinal process. Normal appendix. Colonic diverticulosis. Crowding of bowel loops limits assessment and paucity of fat also limits assessment. Vascular/Lymphatic: Calcified and noncalcified atheromatous plaque of the abdominal aorta. No aneurysmal dilation of the abdominal aorta. There is no gastrohepatic or hepatoduodenal ligament lymphadenopathy. No retroperitoneal or mesenteric lymphadenopathy. No pelvic sidewall lymphadenopathy. Reproductive:  Prostate unremarkable by CT. Other: No ascites.  No signs of free air. Musculoskeletal: Spinal degenerative changes. No acute bone finding. No destructive bone process. IMPRESSION: 1. Large hiatal hernia. Mild circumferential distal esophageal thickening, may represent esophagitis. 2. Potential fat containing lesion in the LEFT kidney may represent a small angiomyolipoma. Consider follow-up noncontrast CT on a nonemergent basis for complete characterization. 3. Mild dilation of the main pancreatic duct in the head of the pancreas with smooth tapering distally, correlate with current evidence of or prior history of pancreatitis. No peripancreatic stranding on today's study. 4. Apical pulmonary nodules in the setting of pleural and parenchymal scarring are of uncertain significance. Largest approximately 8 mm. Non-contrast chest CT at 3-6 months is recommended. If the nodules are stable at time of repeat CT, then future CT at 18-24 months (from today's scan) is considered optional for low-risk patients, but is recommended for high-risk patients. This recommendation follows the consensus statement: Guidelines for Management of Incidental Pulmonary Nodules Detected on CT Images: From the Fleischner Society 2017; Radiology 2017; 284:228-243. 5. Normal appendix. 6. Calcified coronary artery disease. 7. Aortic atherosclerosis. Aortic Atherosclerosis (ICD10-I70.0). Electronically Signed   By: Zetta Bills M.D.   On: 05/17/2020 19:54   CT ABDOMEN PELVIS W CONTRAST  Result Date: 05/17/2020 CLINICAL DATA:  Abdominal distension nausea, vomiting weakness and weight loss. EXAM: CT CHEST, ABDOMEN, AND PELVIS WITH CONTRAST TECHNIQUE: Multidetector CT imaging of the chest, abdomen and pelvis was performed following the standard protocol during bolus administration of intravenous contrast. CONTRAST:  174mL OMNIPAQUE IOHEXOL  350 MG/ML SOLN COMPARISON:  None FINDINGS: CT CHEST FINDINGS Cardiovascular: Heart size is normal.  Calcified coronary artery disease. Normal venous phase appearance of central pulmonary vasculature. Calcified and noncalcified atheromatous plaque of the thoracic aorta. Mediastinum/Nodes: Thoracic inlet structures are normal. No axillary lymphadenopathy. No mediastinal adenopathy. No hilar lymphadenopathy. Lungs/Pleura: Biapical pleural and parenchymal scarring. No consolidation. No pleural effusion. Airways are patent. 8 mm nodule at the LEFT lung apex (image 26 of series 7) 7 mm nodule at the LEFT lung apex (image 33, series 7) Musculoskeletal: No chest wall mass. Signs of prior trauma to the RIGHT posterior ribs with chronic, healed rib fractures of posterior ribs 6 through 8. Also with similar pattern on the LEFT with healed posterolateral rib fractures. No destructive bone process. See below for full musculoskeletal detail. CT ABDOMEN PELVIS FINDINGS Hepatobiliary: Normal appearance of the liver. No pericholecystic stranding. No biliary duct dilation. Pancreas: 4-5 mm dilation of the main pancreatic duct, smoothly contoured an without visible pancreatic lesion. Spleen: Normal Adrenals/Urinary Tract: Adrenal glands are normal. Symmetric renal enhancement. No hydronephrosis. Urinary bladder grossly unremarkable. Low-density lesion in the upper pole the LEFT kidney measures approximately 9 mm and is likely a small cyst. Assessment of the kidneys is markedly limited due to beam hardening artifact. There is an area of low attenuation that passes through the interpolar LEFT kidney in area of marked artifact that shows fat density measurements measuring approximately 1.5 x 1.5 cm. Stomach/Bowel: Large hiatal hernia. Mild circumferential distal esophageal thickening. No acute gastrointestinal process. Normal appendix. Colonic diverticulosis. Crowding of bowel loops limits assessment and paucity of fat also limits assessment. Vascular/Lymphatic: Calcified and noncalcified atheromatous plaque of the abdominal aorta. No  aneurysmal dilation of the abdominal aorta. There is no gastrohepatic or hepatoduodenal ligament lymphadenopathy. No retroperitoneal or mesenteric lymphadenopathy. No pelvic sidewall lymphadenopathy. Reproductive: Prostate unremarkable by CT. Other: No ascites.  No signs of free air. Musculoskeletal: Spinal degenerative changes. No acute bone finding. No destructive bone process. IMPRESSION: 1. Large hiatal hernia. Mild circumferential distal esophageal thickening, may represent esophagitis. 2. Potential fat containing lesion in the LEFT kidney may represent a small angiomyolipoma. Consider follow-up noncontrast CT on a nonemergent basis for complete characterization. 3. Mild dilation of the main pancreatic duct in the head of the pancreas with smooth tapering distally, correlate with current evidence of or prior history of pancreatitis. No peripancreatic stranding on today's study. 4. Apical pulmonary nodules in the setting of pleural and parenchymal scarring are of uncertain significance. Largest approximately 8 mm. Non-contrast chest CT at 3-6 months is recommended. If the nodules are stable at time of repeat CT, then future CT at 18-24 months (from today's scan) is considered optional for low-risk patients, but is recommended for high-risk patients. This recommendation follows the consensus statement: Guidelines for Management of Incidental Pulmonary Nodules Detected on CT Images: From the Fleischner Society 2017; Radiology 2017; 284:228-243. 5. Normal appendix. 6. Calcified coronary artery disease. 7. Aortic atherosclerosis. Aortic Atherosclerosis (ICD10-I70.0). Electronically Signed   By: Zetta Bills M.D.   On: 05/17/2020 19:54   Lab Results  Component Value Date   WBC 8.9 05/17/2020   HGB 15.6 05/17/2020   HCT 44.6 05/17/2020   MCV 96.7 05/17/2020   PLT 221 05/17/2020  ] Lab Results  Component Value Date   CREATININE 0.90 05/17/2020   BUN 16 05/17/2020   NA 136 05/17/2020   K 3.6 05/17/2020    CL 104 05/17/2020   CO2 23 05/17/2020    Lab  Results  Component Value Date   ALT 19 05/17/2020   AST 19 05/17/2020   ALKPHOS 63 05/17/2020   BILITOT 1.2 05/17/2020     Physical Exam: BP 100/68   Pulse 82   Ht 5\' 11"  (1.803 m)   Wt 129 lb (58.5 kg)   BMI 17.99 kg/m  Constitutional: Pleasant,well-developed, male in no acute distress. HEENT: Normocephalic and atraumatic. Conjunctivae are normal.  Neck supple.  Cardiovascular: Normal rate, regular rhythm.  Pulmonary/chest: Effort normal and breath sounds normal.  Abdominal: Soft, nondistended, nontender.  There are no masses palpable.  Extremities: no edema Lymphadenopathy: No cervical adenopathy noted. Neurological: Alert and oriented to person place and time. Full Neuro exam not done Skin: Skin is warm and dry. No rashes noted. Psychiatric: Normal mood and affect. Behavior is normal.   ASSESSMENT AND PLAN: 74 year old male here for new patient assessment the following:  Abnormal GI tract imaging  Weight loss Colon cancer screening Lower extremity weakness  History as above, progressive dramatic lower extremity weakness, now barely able to walk.  He went to the ED to expedite the evaluation hopes of getting a sooner neurology appointment, he is now scheduled to see Neurology in 2 days.  As part of his evaluation he had extensive imaging in the ED, noted incidentally to have a large hiatal hernia and mild esophagitis.  He has no symptoms of reflux or from the hernia at this time.  I do think he ultimately would warrant an EGD to follow-up with the imaging of the esophagus and ensure no Barrett's esophagus or mass lesion, in light of his weight loss, and to perform screening colonoscopy as he has never had that, however he needs to have this weakness / CVA (unclear when this event occurred ) / neurologic issue addressed first by Neurology, and we need to get records from his cardiologist to determine his ejection fraction and  his candidacy for anesthesia at our office versus if he will need to have his procedure performed at the hospital.  His EGD and colonoscopy are nonurgent. I have discussed risks / benefits of the exams and anesthesia with him and he wants to proceed when he is able to.  Based off his imaging of his pancreas I think he may also warrant a follow-up MRCP to further evaluate the pancreatic ductal dilation although there is no obvious mass lesions on the CT scan.  No clear cause for his weight loss on imaging thus far.  I will schedule him for a follow-up visit with me in the clinic in 2 months for reassessment and we will coordinate procedures at that time if he is stable enough to undergo that, once we have more information from his cardiologist and neurologist.  If he has any questions or concerns in the interim he will contact me.   [place] Cellar, MD [facility]

## 2020-05-24 ENCOUNTER — Other Ambulatory Visit (INDEPENDENT_AMBULATORY_CARE_PROVIDER_SITE_OTHER): Payer: Medicare HMO

## 2020-05-24 ENCOUNTER — Encounter: Payer: Self-pay | Admitting: Neurology

## 2020-05-24 ENCOUNTER — Other Ambulatory Visit: Payer: Self-pay

## 2020-05-24 ENCOUNTER — Ambulatory Visit: Payer: Medicare HMO | Admitting: Neurology

## 2020-05-24 VITALS — BP 131/80 | HR 89 | Ht 71.0 in | Wt 123.0 lb

## 2020-05-24 DIAGNOSIS — M21372 Foot drop, left foot: Secondary | ICD-10-CM

## 2020-05-24 DIAGNOSIS — G609 Hereditary and idiopathic neuropathy, unspecified: Secondary | ICD-10-CM | POA: Diagnosis not present

## 2020-05-24 DIAGNOSIS — I5021 Acute systolic (congestive) heart failure: Secondary | ICD-10-CM | POA: Diagnosis not present

## 2020-05-24 DIAGNOSIS — G629 Polyneuropathy, unspecified: Secondary | ICD-10-CM

## 2020-05-24 DIAGNOSIS — E7800 Pure hypercholesterolemia, unspecified: Secondary | ICD-10-CM | POA: Diagnosis not present

## 2020-05-24 DIAGNOSIS — E119 Type 2 diabetes mellitus without complications: Secondary | ICD-10-CM | POA: Diagnosis not present

## 2020-05-24 LAB — B12 AND FOLATE PANEL
Folate: 14.7 ng/mL (ref >5.9)
Vitamin B-12: >1526 pg/mL — ABNORMAL HIGH (ref 211–911)

## 2020-05-24 LAB — C-REACTIVE PROTEIN: CRP: <1 mg/dL (ref 0.5–20.0)

## 2020-05-24 LAB — SEDIMENTATION RATE: Sed Rate: 12 mm/hr (ref 0–20)

## 2020-05-24 NOTE — Patient Instructions (Signed)
Start physical therapy for the left foot drop and gait training  Referral to [facility] for left ankle foot brace  Check labs   Lumbar puncture  Nerve testing of the legs

## 2020-05-24 NOTE — Progress Notes (Signed)
[facility] Clinic Note - Initial Visit   Date: 05/24/20  Aaron Rush MRN: [id number] DOB: 22-Sep-1945   Dear Dr. Brigitte Rush:  Thank you for your kind referral of Aaron Rush for consultation of left foot drop and gait instability. Although his history is well known to you, please allow Aaron Rush to reiterate it for the purpose of our medical record. The patient was accompanied to the clinic by sister [name]) who also provides collateral information.     History of Present Illness: Aaron Rush is a 74 y.o. right-handed male with cardiomyopathy, hypertension, and CAD presenting for evaluation of left foot drop and gait instability.   Starting around June 2021, he began having tingling in the feet which progressed into his lower legs.  The same time, he developed imbalance which steadily progressed where he needed to use a cane and has been using a walker since October.  Over the past two month, his left foot started to drag and he has been unable to move his toes.  He is falling several times per week, but is able to get up by himself. No low back pain, bowel/bladder issues. He denies weakness in the face and hands.     He is not diabetic.  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  He lives in a 2-level home by himself. No children.    Out-side paper records, electronic medical record, and images have been reviewed where available and summarized as:  CT brain 05/17/2020: Small infarcts within the bilateral cerebellar hemispheres are favored subacute or chronic given the degree of hypodensity. Brain MRI may be obtained for confirmation, as clinically warranted.  Otherwise unremarkable non-contrast CT appearance of the brain for Age.  Lab Results  Component Value Date   TSH 3.257 05/17/2020     Past Medical History:  Diagnosis Date  .  Aortic atherosclerosis ([place])   . CAD (coronary artery disease)   . Cardiomyopathy ([place])   . CHF (congestive heart failure) ([place])   . Diverticulosis   . Hiatal hernia   . 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 HERNIA REPAIR       Medications:  Outpatient Encounter Medications as of 05/24/2020  Medication Sig  . carvedilol (COREG) 3.125 MG tablet Take 3.125 mg by mouth 2 (two) times daily.  . cephALEXin (KEFLEX) 500 MG capsule Take 1 capsule (500 mg total) by mouth 3 (three) times daily.  . CVS CALCIUM 600 MG tablet Take 1 tablet by mouth 2 (two) times daily.  Marland Kitchen ENTRESTO 24-26 MG Take 1 tablet by mouth 2 (two) times daily.  . pantoprazole (PROTONIX) 20 MG tablet Take 1 tablet (20 mg total) by mouth daily.  Marland Kitchen spironolactone (ALDACTONE) 25 MG tablet Take 25 mg by mouth daily.   No facility-administered encounter medications on file as of 05/24/2020.    Allergies: No Known Allergies  Family History: Family History  Problem Relation Age of Onset  . Colon cancer Neg Hx   . Esophageal cancer Neg Hx   . Stomach cancer Neg Hx     Social History: Social History   Tobacco Use  . Smoking status: Never Smoker  . Smokeless tobacco: Never Used  Vaping Use  . Vaping Use: Never used  Substance Use Topics  . Alcohol use: Never    Comment: stopped 1982  . Drug use: Never   Social History   Social History Narrative   Right Handed   One Story Home     Vital Signs:  BP 131/80   Rush 89   Ht _0  (1.803 m)   Wt 123 lb (55.8 kg)   SpO2 100%   BMI 17.16 kg/m    General Medical Exam:   General:  Well appearing, comfortable.  Very thin appearing  Neurological Exam: MENTAL STATUS including orientation to time, place, person, recent and remote memory, attention span and concentration, language, and fund of knowledge is normal.  Speech is not dysarthric.  CRANIAL NERVES: II:  No visual field  defects.  III-IV-VI: Pupils equal round and reactive to light.  Normal conjugate, extra-ocular eye movements in all directions of gaze.  No nystagmus.  No ptosis.   V:  Normal facial sensation.    VII:  Normal facial symmetry and movements.   VIII:  Normal hearing and vestibular function.   IX-X:  Normal palatal movement.   XI:  Normal shoulder shrug and head rotation.   XII:  Normal tongue strength and range of motion, no deviation or fasciculation.  MOTOR:  Generalized loss of muscle bulk throughout, with prominent lower leg atrophy in the left leg.  No fasciculations or abnormal movements.  No pronator drift.   Upper Extremity:  Right  Left  Deltoid  5/5   5/5   Biceps  5/5   5/5   Triceps  5/5   5/5   Infraspinatus 5/5  5/5  Medial pectoralis 5/5  5/5  Wrist extensors  5/5   5/5   Wrist flexors  5/5   5/5   Finger extensors  5/5   5/5   Finger flexors  5/5   5/5   Dorsal interossei  5/5   5/5   Abductor pollicis  5/5   5/5   Tone (Ashworth scale)  0  0   Lower Extremity:  Right  Left  Hip flexors  4-/5   4-/5   Hip extensors  4/5   3/5   Adductor 4/5  4/5  Abductor 4/5  4/5  Knee flexors  4/5   3/5   Knee extensors  4/5   3/5   Dorsiflexors  4/5   1/5   Plantarflexors  4/5   1/5   Toe extensors  4/5   1/5   Toe flexors  4/5   1/5   Tone (Ashworth scale)  0  0   MSRs:  Right        Left                  brachioradialis 2+  2+  biceps 2+  2+  triceps 2+  2+  patellar 0  0  ankle jerk 0  0  Hoffman no  no  plantar response down  down   SENSORY:  Vibration is 30% at the knees, absent below the feet bilaterally.  Temperature and pin prick reduced in a gradient pattern in the legs.   COORDINATION/GAIT: Normal finger-to- nose-finger.  Intact rapid alternating movements bilaterally.  Unable to stand up without using arms.  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  Will need testing for neuropathy, CIDP, and other  immune-mediated neuropathies. CT head shows bilateral chronic cerebellar infarct which would not explain his current presentation.  With his unexplained weight loss, paraneoplastic process cannot be excluded.  Recommend malignancy evaluation as per PCP.   PLAN/RECOMMENDATIONS:  1.  Check ESR, CRP, vitamin B12, folate, vitamin B1, copper, SPEP with IFE, heavy metal screen, ANA, ANCA, SSA/B, . 2.  Lumbar puncture to check CSF cell count and diff, protein, glucose, IgG index, oligoclonal bands, myelin basic protein, flow cytology, CSF ACE, CSF lyme PCR. 3.  NCS/EMG of the legs (left > right)  4.  Start physical therapy 5.  Referral to [facility] for left AFO 6.  Patient educated on daily foot inspection, fall prevention, and safety precautions around the home.  Follow-up after testing  Total time spent: 75 min   Thank you for allowing me to participate in patient's care.  If I can answer any additional questions, I would be pleased to do so.    Sincerely,    Birtie Fellman K. Posey Pronto, DO

## 2020-05-26 LAB — HEAVY METALS, BLOOD
Arsenic: 1 ug/L — ABNORMAL LOW (ref 2–23)
Lead, Blood: 1 ug/dL (ref 0–4)
Mercury: <1 ug/L (ref 0.0–14.9)

## 2020-05-28 ENCOUNTER — Telehealth: Payer: Self-pay | Admitting: Neurology

## 2020-05-28 LAB — SJOGREN'S SYNDROME ANTIBODS(SSA + SSB)
SSA (Ro) (ENA) Antibody, IgG: <1 AI
SSB (La) (ENA) Antibody, IgG: <1 AI

## 2020-05-28 LAB — PROTEIN ELECTROPHORESIS, SERUM
Albumin ELP: 4.1 g/dL (ref 3.8–4.8)
Alpha 1: 0.3 g/dL (ref 0.2–0.3)
Alpha 2: 0.7 g/dL (ref 0.5–0.9)
Beta 2: 0.5 g/dL (ref 0.2–0.5)
Beta Globulin: 0.5 g/dL (ref 0.4–0.6)
Gamma Globulin: 1.1 g/dL (ref 0.8–1.7)
Total Protein: 7.1 g/dL (ref 6.1–8.1)

## 2020-05-28 LAB — ANCA SCREEN W REFLEX TITER: ANCA Screen: NEGATIVE

## 2020-05-28 LAB — IMMUNOFIXATION ELECTROPHORESIS
IgG (Immunoglobin G), Serum: 1100 mg/dL (ref 600–1540)
IgM, Serum: 140 mg/dL (ref 50–300)
Immunofix Electr Int: NOT DETECTED
Immunoglobulin A: 485 mg/dL — ABNORMAL HIGH (ref 70–320)

## 2020-05-28 LAB — ANA: Anti Nuclear Antibody (ANA): NEGATIVE

## 2020-05-28 LAB — VITAMIN B1: Vitamin B1 (Thiamine): 251 nmol/L — ABNORMAL HIGH (ref 8–30)

## 2020-05-28 LAB — COPPER, SERUM: Copper: 79 ug/dL (ref 70–175)

## 2020-05-28 NOTE — Addendum Note (Signed)
Addended by: Armen Pickup A on: 05/28/2020 11:40 AM   Modules accepted: Orders

## 2020-05-28 NOTE — Telephone Encounter (Signed)
Called and informed patient of results. Patient verbalized understanding and will proceed with Spinal Tap.

## 2020-05-28 NOTE — Telephone Encounter (Signed)
Pt wants to know if he does the Spinal tap on 05-31-20 that Drt Posey Pronto is ok with that  Please call

## 2020-05-28 NOTE — Telephone Encounter (Signed)
Yes, that would be great.

## 2020-05-28 NOTE — Telephone Encounter (Signed)
-----   Message from Alda Berthold, DO sent at 05/28/2020  2:36 PM EST ----- Please notify patient lab are within normal limits.  Proceed with spinal tap as planned. Thank you.

## 2020-05-28 NOTE — Telephone Encounter (Signed)
Patient advised.

## 2020-05-31 ENCOUNTER — Other Ambulatory Visit: Payer: Self-pay

## 2020-05-31 ENCOUNTER — Ambulatory Visit
Admission: RE | Admit: 2020-05-31 | Discharge: 2020-05-31 | Disposition: A | Discharge status: Home / Self Care | Payer: Medicare HMO | Source: Ambulatory Visit | Attending: Neurology | Admitting: Neurology

## 2020-05-31 ENCOUNTER — Other Ambulatory Visit (HOSPITAL_COMMUNITY)
Admission: RE | Admit: 2020-05-31 | Discharge: 2020-05-31 | Disposition: A | Discharge status: Home / Self Care | Payer: Medicare HMO | Source: Ambulatory Visit | Attending: Neurology | Admitting: Neurology

## 2020-05-31 DIAGNOSIS — G629 Polyneuropathy, unspecified: Secondary | ICD-10-CM | POA: Insufficient documentation

## 2020-05-31 DIAGNOSIS — M21372 Foot drop, left foot: Secondary | ICD-10-CM | POA: Diagnosis not present

## 2020-05-31 DIAGNOSIS — G609 Hereditary and idiopathic neuropathy, unspecified: Secondary | ICD-10-CM | POA: Diagnosis not present

## 2020-05-31 NOTE — Progress Notes (Signed)
One SST tube of blood drawn from right [facility] space for LP labs; site unremarkable.

## 2020-05-31 NOTE — Discharge Instructions (Signed)

## 2020-06-01 ENCOUNTER — Other Ambulatory Visit: Payer: Self-pay

## 2020-06-01 ENCOUNTER — Telehealth: Payer: Self-pay

## 2020-06-01 LAB — CYTOLOGY - NON PAP

## 2020-06-01 NOTE — Telephone Encounter (Signed)
Noted  

## 2020-06-01 NOTE — Telephone Encounter (Signed)
Returned a call to [place] at [phone] Reference #: [id number] Q. Spoke to [place] and she informed me that patients spinal tap order for CSF Cell Count w/ Diff was "unable to report due to insufficient analyzable cells." and the result was canceled by the ancillary.

## 2020-06-04 ENCOUNTER — Telehealth: Payer: Self-pay | Admitting: Neurology

## 2020-06-04 DIAGNOSIS — R29898 Other symptoms and signs involving the musculoskeletal system: Secondary | ICD-10-CM | POA: Diagnosis not present

## 2020-06-04 DIAGNOSIS — J90 Pleural effusion, not elsewhere classified: Secondary | ICD-10-CM | POA: Diagnosis not present

## 2020-06-04 DIAGNOSIS — I429 Cardiomyopathy, unspecified: Secondary | ICD-10-CM | POA: Diagnosis not present

## 2020-06-04 NOTE — Telephone Encounter (Signed)
Patient's sister [name] called in wanting to make sure Dr. Posey Pronto was aware that they did just recently found out that the patient does have diabetes. His A1C was 9.6 and Glucose Reading was 146.

## 2020-06-05 ENCOUNTER — Other Ambulatory Visit: Payer: Self-pay

## 2020-06-05 DIAGNOSIS — G20 Parkinson's disease: Secondary | ICD-10-CM

## 2020-06-05 NOTE — Telephone Encounter (Signed)
Noted.  Please see if patient is able to move his EMG appointment to next week on 12/3 at 12:45p, I can also go over his spinal tap results at that time.  Thanks.

## 2020-06-06 DIAGNOSIS — M21372 Foot drop, left foot: Secondary | ICD-10-CM | POA: Diagnosis not present

## 2020-06-11 DIAGNOSIS — M21372 Foot drop, left foot: Secondary | ICD-10-CM | POA: Diagnosis not present

## 2020-06-12 LAB — CNS IGG SYNTHESIS RATE, CSF+BLOOD
Albumin Serum: 4.4 g/dL (ref 3.2–4.6)
Albumin, CSF: 79.7 mg/dL — ABNORMAL HIGH (ref 8.0–42.0)
CNS-IgG Synthesis Rate: 1.6 mg/24 h (ref <=3.3)
IgG (Immunoglobin G), Serum: 1180 mg/dL (ref 600–1540)
IgG Total CSF: 10.5 mg/dL — ABNORMAL HIGH (ref 0.8–7.7)
IgG-Index: 0.49 (ref <0.66)

## 2020-06-12 LAB — CSF CELL COUNT WITH DIFFERENTIAL
RBC Count, CSF: 3 cells/uL — ABNORMAL HIGH
WBC, CSF: 0 cells/uL (ref 0–5)

## 2020-06-12 LAB — BORRELIA SPECIES DNA, FLUID, PCR: B. burgdorferi DNA: NOT DETECTED

## 2020-06-12 LAB — OLIGOCLONAL BANDS, CSF + SERM

## 2020-06-12 LAB — GLUCOSE, CSF: Glucose, CSF: 104 mg/dL — ABNORMAL HIGH (ref 40–80)

## 2020-06-12 LAB — MYELIN BASIC PROTEIN, CSF: Myelin Basic Protein: <2 mcg/L (ref 2.0–4.0)

## 2020-06-12 LAB — ANGIOTENSIN CONVERTING ENZYME, CSF: ACE, CSF: 3 U/L (ref <=15)

## 2020-06-12 LAB — PROTEIN, CSF: Total Protein, CSF: 124 mg/dL — ABNORMAL HIGH (ref 15–60)

## 2020-06-13 DIAGNOSIS — M21372 Foot drop, left foot: Secondary | ICD-10-CM | POA: Diagnosis not present

## 2020-06-15 ENCOUNTER — Other Ambulatory Visit: Payer: Self-pay

## 2020-06-15 ENCOUNTER — Ambulatory Visit: Payer: Medicare HMO | Admitting: Neurology

## 2020-06-15 DIAGNOSIS — G629 Polyneuropathy, unspecified: Secondary | ICD-10-CM | POA: Diagnosis not present

## 2020-06-15 DIAGNOSIS — M21372 Foot drop, left foot: Secondary | ICD-10-CM | POA: Diagnosis not present

## 2020-06-15 NOTE — Procedures (Signed)
[facility]  [address], [address]  [place], [place] [address] Tel: [phone] Fax:  [phone] Test Date:  06/15/2020  Patient: Aaron Rush DOB: [birth year] Physician: Narda Amber, DO  Sex: Male Height: 5\' 11"  Ref Phys: Narda Amber, DO  ID#: [id number]   Technician:    Patient Complaints: This is a 74 year old man referred for evaluation of bilateral leg weakness, worse on the left leg there is foot drop.  NCV & EMG Findings: Extensive electrodiagnostic testing of the left upper extremity and additional studies of the right shows:  1. Bilateral superficial peroneal and left sural sensory responses are absent.  Right sural sensory response shows normal amplitude. 2. Bilateral peroneal motor responses at the extensor digitorum brevis are absent.  Peroneal motor response of the left tibialis anterior is barely and on the left, normal on the right.  Bilateral tibial motor responses show reduced amplitude. 3. Bilateral tibial H reflex studies are absent. 4. Chronic motor axonal loss changes are seen affecting nearly all the tested muscles in the lower extremities, which is worse on the left where there is evidence of active denervation in all of the muscles along with the lumbar paraspinal muscles.  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  Overall, these findings are severe in degree electrically.   ___________________________ Narda Amber, DO    Nerve Conduction Studies Anti Sensory Summary Table   Stim Site NR Peak (ms) Norm Peak (ms) P-T Amp (V) Norm P-T Amp  Left Sup Peroneal Anti Sensory (Ant Lat Mall)  34C  12 cm NR  <4.6  >3  Right Sup Peroneal Anti Sensory (Ant Lat Mall)  34C  12 cm NR  <4.6  >3  Left Sural Anti Sensory (Lat Mall)  34C  Calf NR  <4.6  >3  Right Sural Anti Sensory (Lat Mall)  34C  Calf    3.3 <4.6 4.7 >3    Motor Summary Table   Stim Site NR Onset (ms) Norm Onset (ms) O-P Amp (mV) Norm O-P Amp Site1 Site2 Delta-0 (ms) Dist (cm) Vel (m/s) Norm Vel (m/s)  Left Peroneal Motor (Ext Dig Brev)  34C  Ankle NR  <6.0  >2.5 B Fib Ankle  0.0  >40  B Fib NR     Poplt B Fib  0.0  >40  Poplt    20.7  1.0         Right Peroneal Motor (Ext Dig Brev)  34C  Ankle NR  <6.0  >2.5 B Fib Ankle  0.0  >40  B Fib NR     Poplt B Fib  0.0  >40  Poplt NR            Left Peroneal TA Motor (Tib Ant)  34C  Fib Head    3.0 <4.5 0.7 >3 Poplit Fib Head 5.8 8.0 14 >40  Poplit    8.8  0.7         Right Peroneal TA Motor (Tib Ant)  34C  Fib Head    4.0 <4.5 3.5 >3 Poplit Fib Head 2.0 8.0 40 >40  Poplit    6.0  3.0         Left Tibial Motor (Abd Hall Brev)  34C  Ankle    6.0 <6.0 1.6 >4 Knee Ankle 9.3 38.0 41 >40  Knee    15.3  1.2         Right Tibial Motor (Abd Marvin  Brev)  34C  Ankle    4.1 <6.0 2.5 >4 Knee Ankle 11.8 39.0 33 >40  Knee    15.9  2.0          H Reflex Studies   NR H-Lat (ms) Lat Norm (ms) L-R H-Lat (ms)  Left Tibial (Gastroc)  34C  NR  <35   Right Tibial (Gastroc)  34C  NR  <35    EMG   Side Muscle Ins Act Fibs Psw Fasc Number Recrt Dur Dur. Amp Amp. Poly Poly. Comment  Right AntTibialis Nml Nml Nml Nml 1- Rapid Some 1+ Some 1+ Nml Nml N/A  Right Gastroc Nml Nml Nml Nml 1- Rapid Some 1+ Some 1+ Nml Nml N/A  Right Flex Dig Long Nml Nml Nml Nml 2- Rapid Many 1+ Many 1+ Many Nml N/A  Right RectFemoris Nml Nml Nml Nml 1- Rapid Some 1+ Some 1+ Nml Nml N/A  Right GluteusMed Nml Nml Nml Nml Nml Nml Nml Nml Nml Nml Nml Nml N/A  Left Lumbo Parasp Low Nml 1+ Nml Nml Nml Nml Nml Nml Nml Nml Nml Nml N/A  Right BicepsFemS Nml Nml Nml Nml Nml Nml Nml Nml Nml Nml Nml Nml N/A  Left AntTibialis Nml 1+ Nml Nml SMU Rapid All 1+ All 1+ Nml Nml N/A  Left Gastroc Nml 1+ Nml Nml 2- Rapid Many 1+ Many 1+ Nml Nml N/A  Left GluteusMed Nml 1+ Nml Nml 2- Rapid Most 1+ Many 1+ Nml Nml N/A  Left BicepsFemS Nml 1+  Nml Nml 2- Rapid Most 1+ Most 1+ Most 1+ N/A  Left RectFemoris Nml 1+ Nml Nml SMU Rapid All 1+ All 1+ Nml Nml N/A      Waveforms:

## 2020-06-15 NOTE — Progress Notes (Signed)
Follow-up Visit   Date: 06/15/20   Aaron Rush MRN: [id number] DOB: 14-Mar-1946   Interim History: Aaron Rush is a 74 y.o. right-handed Caucasian male with cardiomyopathy, hypertension, CAD returning to the clinic for follow-up of sensorimotor polyneuropathy.  The patient was accompanied to the clinic by sister Aaron Rush) who also provides collateral information.    History of present illness: Starting around June 2021, he began having tingling in the feet which progressed into his lower legs.  The same time, he developed imbalance which steadily progressed where he needed to use a cane and has been using a walker since October.  Over the past two month, his left foot started to drag and he has been unable to move his toes.  He is falling several times per week, but is able to get up by himself. No low back pain, bowel/bladder issues. He denies weakness in the face and hands.     He is not diabetic.  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  He lives in a 2-level home by himself. No children.    UPDATE 06/15/2020:  He is here for follow-up visit and EDX testing.  Since his visit, he was diagnosed with diabetes HbA1c ~ 9 and started on metformin.  Patient's sister is concerned because they did not get diabetes education and would like resources.  He also has CSF testing which shows markedly elevated CSF protein and glucose.  He has been going to out-pt PT and feels that the muscle tone in the legs is improving.  No change in the severity of his left leg weakness.   Medications:  Current Outpatient Medications on File Prior to Visit  Medication Sig Dispense Refill  . carvedilol (COREG) 6.25 MG tablet Take 6.25 mg by mouth 2 (two) times daily.    . cephALEXin (KEFLEX) 500 MG capsule Take 1 capsule (500 mg total) by mouth 3 (three) times daily. 21 capsule 0  . CVS  CALCIUM 600 MG tablet Take 1 tablet by mouth 2 (two) times daily.    Marland Kitchen ENTRESTO 24-26 MG Take 1 tablet by mouth 2 (two) times daily.    . metFORMIN (GLUCOPHAGE) 1000 MG tablet Take 1,000 mg by mouth 2 (two) times daily.    . pantoprazole (PROTONIX) 20 MG tablet Take 1 tablet (20 mg total) by mouth daily. 30 tablet 0  . spironolactone (ALDACTONE) 25 MG tablet Take 25 mg by mouth daily.     No current facility-administered medications on file prior to visit.    Allergies: No Known Allergies  Vital Signs:  There were no vitals taken for this visit.   Neurological Exam: MENTAL STATUS including orientation to time, place, person, recent and remote memory, attention span and concentration, language, and fund of knowledge is normal.  Speech is not dysarthric.  MOTOR:  Generalized loss of muscle bulk with severe atrophy in the legs.  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  Overall, these findings are severe in degree electrically.  CSF 05/31/2020:  CSF R3 W0 G106** P124**  IgG index 0.49, ACE, Lyme, cytology, OCB -neg   IMPRESSION/PLAN: Diabetic polyradiculoneuropathy - severe.  Manifesting with distal paresthesias and asymmetric  severe weakness in the legs, worse on the left where there is flail foot.  Reviewed results of EMG and CSF.  There is markedly elevated CSF glucose and protein, consistent with diabetic polyradiculoneuropathy.  Discussed that management is focused at diabetes control and rehabilitation.  He will discuss with PCP about diabetes education, nutrition consult, etc.  . Continue out-patient PT.   Left AFO has been ordered.  Return to clinic in 2 months.    Thank you for allowing me to participate in patient's care.  If I can answer any additional  questions, I would be pleased to do so.    Sincerely,    Derrell Milanes K. Posey Pronto, DO

## 2020-06-18 ENCOUNTER — Telehealth: Payer: Self-pay | Admitting: Gastroenterology

## 2020-06-18 NOTE — Telephone Encounter (Signed)
Records from PCP arrived:  Echo July 2021 - EF 35-40%, Aortic valve mild sclerosis without significant gradient. Trace TR. Trace AR  He will see me at his scheduled office visit in January for reassessment

## 2020-06-21 ENCOUNTER — Other Ambulatory Visit: Payer: Self-pay

## 2020-06-21 ENCOUNTER — Encounter (HOSPITAL_BASED_OUTPATIENT_CLINIC_OR_DEPARTMENT_OTHER): Payer: Self-pay | Admitting: *Deleted

## 2020-06-21 ENCOUNTER — Inpatient Hospital Stay (HOSPITAL_BASED_OUTPATIENT_CLINIC_OR_DEPARTMENT_OTHER)
Admission: EM | Admit: 2020-06-21 | Discharge: 2020-06-25 | DRG: 643 | Disposition: A | Discharge status: Home / Self Care | Payer: Medicare HMO | Attending: Student | Admitting: Student

## 2020-06-21 DIAGNOSIS — Z79899 Other long term (current) drug therapy: Secondary | ICD-10-CM

## 2020-06-21 DIAGNOSIS — A691 Other Vincent's infections: Secondary | ICD-10-CM | POA: Diagnosis not present

## 2020-06-21 DIAGNOSIS — K5790 Diverticulosis of intestine, part unspecified, without perforation or abscess without bleeding: Secondary | ICD-10-CM | POA: Diagnosis not present

## 2020-06-21 DIAGNOSIS — I429 Cardiomyopathy, unspecified: Secondary | ICD-10-CM | POA: Diagnosis not present

## 2020-06-21 DIAGNOSIS — K0510 Chronic gingivitis, plaque induced: Secondary | ICD-10-CM | POA: Diagnosis present

## 2020-06-21 DIAGNOSIS — B9620 Unspecified Escherichia coli [E. coli] as the cause of diseases classified elsewhere: Secondary | ICD-10-CM | POA: Diagnosis not present

## 2020-06-21 DIAGNOSIS — R627 Adult failure to thrive: Secondary | ICD-10-CM | POA: Diagnosis present

## 2020-06-21 DIAGNOSIS — I5022 Chronic systolic (congestive) heart failure: Secondary | ICD-10-CM | POA: Diagnosis present

## 2020-06-21 DIAGNOSIS — E876 Hypokalemia: Secondary | ICD-10-CM | POA: Diagnosis present

## 2020-06-21 DIAGNOSIS — M21372 Foot drop, left foot: Secondary | ICD-10-CM | POA: Diagnosis not present

## 2020-06-21 DIAGNOSIS — E1165 Type 2 diabetes mellitus with hyperglycemia: Secondary | ICD-10-CM | POA: Diagnosis not present

## 2020-06-21 DIAGNOSIS — R634 Abnormal weight loss: Secondary | ICD-10-CM | POA: Diagnosis present

## 2020-06-21 DIAGNOSIS — I509 Heart failure, unspecified: Secondary | ICD-10-CM

## 2020-06-21 DIAGNOSIS — E222 Syndrome of inappropriate secretion of antidiuretic hormone: Principal | ICD-10-CM | POA: Diagnosis present

## 2020-06-21 DIAGNOSIS — Z681 Body mass index (BMI) 19 or less, adult: Secondary | ICD-10-CM

## 2020-06-21 DIAGNOSIS — E871 Hypo-osmolality and hyponatremia: Secondary | ICD-10-CM | POA: Diagnosis present

## 2020-06-21 DIAGNOSIS — N390 Urinary tract infection, site not specified: Secondary | ICD-10-CM | POA: Diagnosis present

## 2020-06-21 DIAGNOSIS — E861 Hypovolemia: Secondary | ICD-10-CM | POA: Diagnosis not present

## 2020-06-21 DIAGNOSIS — R531 Weakness: Secondary | ICD-10-CM

## 2020-06-21 DIAGNOSIS — I2510 Atherosclerotic heart disease of native coronary artery without angina pectoris: Secondary | ICD-10-CM | POA: Diagnosis present

## 2020-06-21 DIAGNOSIS — I110 Hypertensive heart disease with heart failure: Secondary | ICD-10-CM | POA: Diagnosis not present

## 2020-06-21 DIAGNOSIS — IMO0002 Reserved for concepts with insufficient information to code with codable children: Secondary | ICD-10-CM | POA: Diagnosis present

## 2020-06-21 DIAGNOSIS — E860 Dehydration: Secondary | ICD-10-CM | POA: Diagnosis present

## 2020-06-21 DIAGNOSIS — N3001 Acute cystitis with hematuria: Secondary | ICD-10-CM | POA: Diagnosis not present

## 2020-06-21 DIAGNOSIS — Z7984 Long term (current) use of oral hypoglycemic drugs: Secondary | ICD-10-CM

## 2020-06-21 DIAGNOSIS — Z20822 Contact with and (suspected) exposure to covid-19: Secondary | ICD-10-CM | POA: Diagnosis not present

## 2020-06-21 DIAGNOSIS — E43 Unspecified severe protein-calorie malnutrition: Secondary | ICD-10-CM | POA: Diagnosis not present

## 2020-06-21 DIAGNOSIS — Z8673 Personal history of transient ischemic attack (TIA), and cerebral infarction without residual deficits: Secondary | ICD-10-CM

## 2020-06-21 DIAGNOSIS — E1142 Type 2 diabetes mellitus with diabetic polyneuropathy: Secondary | ICD-10-CM | POA: Diagnosis not present

## 2020-06-21 DIAGNOSIS — R64 Cachexia: Secondary | ICD-10-CM | POA: Diagnosis present

## 2020-06-21 LAB — URINALYSIS, ROUTINE W REFLEX MICROSCOPIC
Bilirubin Urine: NEGATIVE
Glucose, UA: NEGATIVE mg/dL
Hgb urine dipstick: NEGATIVE
Ketones, ur: NEGATIVE mg/dL
Nitrite: POSITIVE — AB
Protein, ur: NEGATIVE mg/dL
Specific Gravity, Urine: 1.015 (ref 1.005–1.030)
pH: 6 (ref 5.0–8.0)

## 2020-06-21 LAB — RESP PANEL BY RT-PCR (FLU A&B, COVID) ARPGX2
Influenza A by PCR: NEGATIVE
Influenza B by PCR: NEGATIVE
SARS Coronavirus 2 by RT PCR: NEGATIVE

## 2020-06-21 LAB — BASIC METABOLIC PANEL
Anion gap: 8 (ref 5–15)
BUN: 25 mg/dL — ABNORMAL HIGH (ref 8–23)
CO2: 23 mmol/L (ref 22–32)
Calcium: 8.9 mg/dL (ref 8.9–10.3)
Chloride: 92 mmol/L — ABNORMAL LOW (ref 98–111)
Creatinine, Ser: 0.65 mg/dL (ref 0.61–1.24)
GFR, Estimated: >60 mL/min (ref >60)
Glucose, Bld: 137 mg/dL — ABNORMAL HIGH (ref 70–99)
Potassium: 4.3 mmol/L (ref 3.5–5.1)
Sodium: 123 mmol/L — ABNORMAL LOW (ref 135–145)

## 2020-06-21 LAB — HEPATIC FUNCTION PANEL
ALT: 26 U/L (ref 0–44)
AST: 24 U/L (ref 15–41)
Albumin: 3.8 g/dL (ref 3.5–5.0)
Alkaline Phosphatase: 50 U/L (ref 38–126)
Bilirubin, Direct: 0.2 mg/dL (ref 0.0–0.2)
Indirect Bilirubin: 0.6 mg/dL (ref 0.3–0.9)
Total Bilirubin: 0.8 mg/dL (ref 0.3–1.2)
Total Protein: 6.7 g/dL (ref 6.5–8.1)

## 2020-06-21 LAB — URINALYSIS, MICROSCOPIC (REFLEX): WBC, UA: >50 WBC/hpf (ref 0–5)

## 2020-06-21 LAB — LIPASE, BLOOD: Lipase: 54 U/L — ABNORMAL HIGH (ref 11–51)

## 2020-06-21 LAB — CBC
HCT: 37.4 % — ABNORMAL LOW (ref 39.0–52.0)
Hemoglobin: 13.7 g/dL (ref 13.0–17.0)
MCH: 34 pg (ref 26.0–34.0)
MCHC: 36.6 g/dL — ABNORMAL HIGH (ref 30.0–36.0)
MCV: 92.8 fL (ref 80.0–100.0)
Platelets: 235 10*3/uL (ref 150–400)
RBC: 4.03 MIL/uL — ABNORMAL LOW (ref 4.22–5.81)
RDW: 12 % (ref 11.5–15.5)
WBC: 8.5 10*3/uL (ref 4.0–10.5)
nRBC: 0 % (ref 0.0–0.2)

## 2020-06-21 LAB — SEDIMENTATION RATE: Sed Rate: 3 mm/hr (ref 0–16)

## 2020-06-21 LAB — CK: Total CK: 122 U/L (ref 49–397)

## 2020-06-21 LAB — RAPID URINE DRUG SCREEN, HOSP PERFORMED
Amphetamines: NOT DETECTED
Barbiturates: NOT DETECTED
Benzodiazepines: NOT DETECTED
Cocaine: NOT DETECTED
Opiates: NOT DETECTED
Tetrahydrocannabinol: NOT DETECTED

## 2020-06-21 LAB — CBG MONITORING, ED
Glucose-Capillary: 138 mg/dL — ABNORMAL HIGH (ref 70–99)
Glucose-Capillary: 135 mg/dL — ABNORMAL HIGH (ref 70–99)

## 2020-06-21 LAB — PROTIME-INR
INR: 1.1 (ref 0.8–1.2)
Prothrombin Time: 13.3 seconds (ref 11.4–15.2)

## 2020-06-21 LAB — LACTIC ACID, PLASMA: Lactic Acid, Venous: 1 mmol/L (ref 0.5–1.9)

## 2020-06-21 MED ORDER — SODIUM CHLORIDE 0.9 % IV SOLN: Freq: Once | INTRAVENOUS | Status: AC

## 2020-06-21 MED ORDER — SODIUM CHLORIDE 0.9 % IV SOLN
1.0000 g | Freq: Once | INTRAVENOUS | Status: AC
  Administered 2020-06-21: 1 g via INTRAVENOUS
  Filled 2020-06-21: qty 10

## 2020-06-21 NOTE — Plan of Care (Signed)

## 2020-06-21 NOTE — ED Triage Notes (Signed)
November 4th he was seen here for weight loss. He has continued to loose weight. He has been seen by numerous doctors that cannot find a cause for the weight loss. New onset diabetes diagnosed on November 11th treated with Metformin. Yesterday his MD decreased his dosage. This am his sugar was 122.

## 2020-06-21 NOTE — ED Provider Notes (Signed)
[facility] EMERGENCY DEPARTMENT Provider Note   CSN: [id number] Arrival date & time: 06/21/20  1640     History Chief Complaint  Patient presents with  . Weight Loss    Aaron Rush is a 74 y.o. male with history of CAD, CHF, CVA by recent CT but not clinically.  HPI Patient has had a progressive decline over the past 6 months.  This has included significant weight loss of 30 pounds.  Patient reports he used to be active and was working as a Data processing manager.  He reports he has become extremely weak and developed significant atrophy of the lower extremity musculature.  He has been evaluated by neurology and diagnosed with diabetic polyneuropathy.  However, over the past several days now he reports that he has gotten waves of such intense weakness that he feels that he might collapse.  There is some nausea associated with this.  He has not had fever or vomiting.  Patient reports he has continued to eat moderately sized meals without vomiting.  Reports on occasion his abdomen will get distended and he might vomit a small amount.  This is not a regular occurrence.  He reports he has become so weak that he now uses a walker.  He was doing physical therapy but the past 2 days he has had to discontinue therapy due to the severity of his weakness and inability to stay upright.  No headache.  No Confusion.   Patient has never smoked.  He used to drink beer in the 1980s but has not consumed alcohol for 30 years.  Trade, he has been a Data processing manager, historically in good physical condition.    Past Medical History:  Diagnosis Date  . Aortic atherosclerosis ([place])   . CAD (coronary artery disease)   . Cardiomyopathy ([place])   . CHF (congestive heart failure) ([place])   . Diverticulosis   . Hiatal hernia   . 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 Hyponatremia 06/21/2020    Past Surgical History:   Procedure Laterality Date  . HERNIA REPAIR         Family History  Problem Relation Age of Onset  . Colon cancer Neg Hx   . Esophageal cancer Neg Hx   . Stomach cancer Neg Hx     Social History   Tobacco Use  . Smoking status: Never Smoker  . Smokeless tobacco: Never Used  Vaping Use  . Vaping Use: Never used  Substance Use Topics  . Alcohol use: Never    Comment: stopped 1982  . Drug use: Never    Home Medications Prior to Admission medications   Medication Sig Start Date End Date Taking? Authorizing Provider  carvedilol (COREG) 6.25 MG tablet Take 6.25 mg by mouth 2 (two) times daily. 04/05/20  Yes [provider]  [employer] CALCIUM 600 MG tablet Take 1 tablet by mouth 2 (two) times daily. 04/05/20  Yes [provider]  ENTRESTO 24-26 MG Take 1 tablet by mouth 2 (two) times daily. 05/14/20  Yes [provider]  metFORMIN (GLUCOPHAGE) 1000 MG tablet Take 500 mg by mouth 2 (two) times daily. 05/24/20  Yes [provider]  cephALEXin (KEFLEX) 500 MG capsule Take 1 capsule (500 mg total) by mouth 3 (three) times daily. 05/17/20   Drenda Freeze, MD  pantoprazole (PROTONIX) 20 MG tablet Take 1 tablet (20 mg total)  by mouth daily. 05/17/20   Drenda Freeze, MD  spironolactone (ALDACTONE) 25 MG tablet Take 25 mg by mouth daily. 04/05/20   [provider]    Allergies    Patient has no known allergies.  Review of Systems   Review of Systems 10 Systems reviewed and negative except as per HPI Physical Exam Updated Vital Signs BP 122/75   Pulse 77   Temp 97.6 F (36.4 C) (Oral)   Resp 14   Ht 5\' 11"  (1.803 m)   Wt 55.3 kg   SpO2 100%   BMI 17.02 kg/m   Physical Exam Constitutional:      Comments: Alert with clear mental status.  Patient is cachectic.  No respiratory distress at rest  HENT:     Head: Normocephalic and atraumatic.     Comments: Extreme atrophy of facial musculature.    Mouth/Throat:     Comments: Mucous  membranes are moist.  Patient has extreme necrotizing gingivitis of the lower front dentition.  The gingiva have receded to the point of teeth being predominantly exposed at the roots and large hypertrophic grayish necrotic material around the bases.  Involves all of the lower anterior dentition.  There are other molars that are decayed and eroded to the base.  No associated facial swelling she does not appear to have significant pain associated with this.  The posterior airway is patent Eyes:     Pupils: Pupils are equal, round, and reactive to light.  Neck:     Comments: Possible firm, small lymph node on the right posterior cervical chain.  No large, matted lymph nodes.  No significant thyromegaly Cardiovascular:     Rate and Rhythm: Normal rate and regular rhythm.  Pulmonary:     Effort: Pulmonary effort is normal.     Breath sounds: Normal breath sounds.  Abdominal:     General: There is no distension.     Palpations: Abdomen is soft.     Tenderness: There is no abdominal tenderness. There is no guarding.     Comments: Abdomen is scaphoid.  Femoral pulses bounding.  Musculoskeletal:     Comments: Extreme atrophy of musculature.  Lower legs are cachectic.  No peripheral edema.  Skin:    General: Skin is warm and dry.  Neurological:     Comments: Mental status is alert and clear.  Patient can follow commands.  He can move all extremities at command.  He has generalized weakness.     ED Results / Procedures / Treatments   Labs (all labs ordered are listed, but only abnormal results are displayed) Labs Reviewed  BASIC METABOLIC PANEL - Abnormal; Notable for the following components:      Result Value   Sodium 123 (*)    Chloride 92 (*)    Glucose, Bld 137 (*)    BUN 25 (*)    All other components within normal limits  CBC - Abnormal; Notable for the following components:   RBC 4.03 (*)    HCT 37.4 (*)    MCHC 36.6 (*)    All other components within normal limits  URINALYSIS,  ROUTINE W REFLEX MICROSCOPIC - Abnormal; Notable for the following components:   APPearance CLOUDY (*)    Nitrite POSITIVE (*)    Leukocytes,Ua MODERATE (*)    All other components within normal limits  URINALYSIS, MICROSCOPIC (REFLEX) - Abnormal; Notable for the following components:   Bacteria, UA MANY (*)    All other components within normal  limits  LIPASE, BLOOD - Abnormal; Notable for the following components:   Lipase 54 (*)    All other components within normal limits  CBG MONITORING, ED - Abnormal; Notable for the following components:   Glucose-Capillary 138 (*)    All other components within normal limits  CBG MONITORING, ED - Abnormal; Notable for the following components:   Glucose-Capillary 135 (*)    All other components within normal limits  RESP PANEL BY RT-PCR (FLU A&B, COVID) ARPGX2  URINE CULTURE  CULTURE, BLOOD (ROUTINE X 2)  CULTURE, BLOOD (ROUTINE X 2)  SEDIMENTATION RATE  HEPATIC FUNCTION PANEL  PROTIME-INR  RAPID URINE DRUG SCREEN, HOSP PERFORMED  CK  LACTIC ACID, PLASMA  TSH  C-REACTIVE PROTEIN    EKG EKG not uploaded or released.  Sinus rhythm 81 PR 142 QRS 91 QTC 509 No acute ischemic pattern.  LVH. No significant change from previous comparison.  Radiology No results found.  Procedures Procedures (including critical care time)  Medications Ordered in ED Medications  cefTRIAXone (ROCEPHIN) 1 g in sodium chloride 0.9 % 100 mL IVPB (0 g Intravenous Stopped 06/21/20 2045)  0.9 %  sodium chloride infusion ( Intravenous New Bag/Given 06/21/20 2018)    ED Course  I have reviewed the triage vital signs and the nursing notes.  Pertinent labs & imaging results that were available during my care of the patient were reviewed by me and considered in my medical decision making (see chart for details).  Clinical Course as of 06/21/20 2147  Thu Jun 21, 2020  [id number] Consult: Reviewed with Dr. Marlowe Sax for admission. [MP]    Clinical Course User  Index [MP] Charlesetta Shanks, MD   MDM Rules/Calculators/A&P                         Patient presents as outlined above.  Worsening weakness.  Patient is alert with clear mental status.  Review of diagnostic studies indicates interim development of hyponatremia.  Patient also shows signs of significant UTI which may be contributing.  Underlying cause for severe cachexia unclear.  Will need further diagnostic evaluation.  Patient has severe chronic appearing necrotizing ulcerative gingivitis.  Patient does not appear to have any associated facial swelling or acute pain with this but it is very advanced.  Final Clinical Impression(s) / ED Diagnoses Final diagnoses:  Hyponatremia  Acute cystitis with hematuria  Cachexia ([place])  Chronic necrotizing ulcerative gingivitis  Generalized weakness    Rx / DC Orders ED Discharge Orders    None       Charlesetta Shanks, MD 06/21/20 2151

## 2020-06-22 DIAGNOSIS — Z681 Body mass index (BMI) 19 or less, adult: Secondary | ICD-10-CM | POA: Diagnosis not present

## 2020-06-22 DIAGNOSIS — E1142 Type 2 diabetes mellitus with diabetic polyneuropathy: Secondary | ICD-10-CM | POA: Diagnosis not present

## 2020-06-22 DIAGNOSIS — E871 Hypo-osmolality and hyponatremia: Secondary | ICD-10-CM | POA: Diagnosis not present

## 2020-06-22 DIAGNOSIS — K209 Esophagitis, unspecified without bleeding: Secondary | ICD-10-CM | POA: Diagnosis not present

## 2020-06-22 DIAGNOSIS — B9620 Unspecified Escherichia coli [E. coli] as the cause of diseases classified elsewhere: Secondary | ICD-10-CM | POA: Diagnosis present

## 2020-06-22 DIAGNOSIS — Z79899 Other long term (current) drug therapy: Secondary | ICD-10-CM | POA: Diagnosis not present

## 2020-06-22 DIAGNOSIS — E861 Hypovolemia: Secondary | ICD-10-CM | POA: Diagnosis present

## 2020-06-22 DIAGNOSIS — I5022 Chronic systolic (congestive) heart failure: Secondary | ICD-10-CM | POA: Diagnosis not present

## 2020-06-22 DIAGNOSIS — Z7984 Long term (current) use of oral hypoglycemic drugs: Secondary | ICD-10-CM | POA: Diagnosis not present

## 2020-06-22 DIAGNOSIS — I509 Heart failure, unspecified: Secondary | ICD-10-CM | POA: Diagnosis not present

## 2020-06-22 DIAGNOSIS — R64 Cachexia: Secondary | ICD-10-CM | POA: Diagnosis not present

## 2020-06-22 DIAGNOSIS — E876 Hypokalemia: Secondary | ICD-10-CM | POA: Diagnosis present

## 2020-06-22 DIAGNOSIS — IMO0002 Reserved for concepts with insufficient information to code with codable children: Secondary | ICD-10-CM | POA: Diagnosis present

## 2020-06-22 DIAGNOSIS — R5381 Other malaise: Secondary | ICD-10-CM | POA: Diagnosis not present

## 2020-06-22 DIAGNOSIS — E1165 Type 2 diabetes mellitus with hyperglycemia: Secondary | ICD-10-CM | POA: Diagnosis not present

## 2020-06-22 DIAGNOSIS — N390 Urinary tract infection, site not specified: Secondary | ICD-10-CM | POA: Diagnosis not present

## 2020-06-22 DIAGNOSIS — I2510 Atherosclerotic heart disease of native coronary artery without angina pectoris: Secondary | ICD-10-CM | POA: Diagnosis present

## 2020-06-22 DIAGNOSIS — I110 Hypertensive heart disease with heart failure: Secondary | ICD-10-CM | POA: Diagnosis not present

## 2020-06-22 DIAGNOSIS — I429 Cardiomyopathy, unspecified: Secondary | ICD-10-CM | POA: Diagnosis present

## 2020-06-22 DIAGNOSIS — E43 Unspecified severe protein-calorie malnutrition: Secondary | ICD-10-CM | POA: Insufficient documentation

## 2020-06-22 DIAGNOSIS — R634 Abnormal weight loss: Secondary | ICD-10-CM | POA: Diagnosis not present

## 2020-06-22 DIAGNOSIS — E222 Syndrome of inappropriate secretion of antidiuretic hormone: Secondary | ICD-10-CM | POA: Diagnosis not present

## 2020-06-22 DIAGNOSIS — R627 Adult failure to thrive: Secondary | ICD-10-CM | POA: Diagnosis not present

## 2020-06-22 DIAGNOSIS — K5790 Diverticulosis of intestine, part unspecified, without perforation or abscess without bleeding: Secondary | ICD-10-CM | POA: Diagnosis present

## 2020-06-22 DIAGNOSIS — I5042 Chronic combined systolic (congestive) and diastolic (congestive) heart failure: Secondary | ICD-10-CM | POA: Diagnosis not present

## 2020-06-22 DIAGNOSIS — M21372 Foot drop, left foot: Secondary | ICD-10-CM | POA: Diagnosis present

## 2020-06-22 DIAGNOSIS — E860 Dehydration: Secondary | ICD-10-CM | POA: Diagnosis present

## 2020-06-22 DIAGNOSIS — Z20822 Contact with and (suspected) exposure to covid-19: Secondary | ICD-10-CM | POA: Diagnosis not present

## 2020-06-22 DIAGNOSIS — R918 Other nonspecific abnormal finding of lung field: Secondary | ICD-10-CM | POA: Diagnosis not present

## 2020-06-22 DIAGNOSIS — R531 Weakness: Secondary | ICD-10-CM

## 2020-06-22 DIAGNOSIS — K0510 Chronic gingivitis, plaque induced: Secondary | ICD-10-CM | POA: Diagnosis present

## 2020-06-22 LAB — CBC WITH DIFFERENTIAL/PLATELET
Abs Immature Granulocytes: 0.05 10*3/uL (ref 0.00–0.07)
Basophils Absolute: 0 10*3/uL (ref 0.0–0.1)
Basophils Relative: 0 %
Eosinophils Absolute: 0.1 10*3/uL (ref 0.0–0.5)
Eosinophils Relative: 1 %
HCT: 36.2 % — ABNORMAL LOW (ref 39.0–52.0)
Hemoglobin: 13.3 g/dL (ref 13.0–17.0)
Immature Granulocytes: 1 %
Lymphocytes Relative: 13 %
Lymphs Abs: 1.4 10*3/uL (ref 0.7–4.0)
MCH: 33.9 pg (ref 26.0–34.0)
MCHC: 36.7 g/dL — ABNORMAL HIGH (ref 30.0–36.0)
MCV: 92.3 fL (ref 80.0–100.0)
Monocytes Absolute: 0.7 10*3/uL (ref 0.1–1.0)
Monocytes Relative: 6 %
Neutro Abs: 8.4 10*3/uL — ABNORMAL HIGH (ref 1.7–7.7)
Neutrophils Relative %: 79 %
Platelets: 201 10*3/uL (ref 150–400)
RBC: 3.92 MIL/uL — ABNORMAL LOW (ref 4.22–5.81)
RDW: 12 % (ref 11.5–15.5)
WBC: 10.6 10*3/uL — ABNORMAL HIGH (ref 4.0–10.5)
nRBC: 0 % (ref 0.0–0.2)

## 2020-06-22 LAB — C-REACTIVE PROTEIN: CRP: 0.7 mg/dL (ref <1.0)

## 2020-06-22 LAB — BASIC METABOLIC PANEL
Anion gap: 8 (ref 5–15)
Anion gap: 9 (ref 5–15)
BUN: 17 mg/dL (ref 8–23)
BUN: 13 mg/dL (ref 8–23)
CO2: 22 mmol/L (ref 22–32)
CO2: 22 mmol/L (ref 22–32)
Calcium: 8.4 mg/dL — ABNORMAL LOW (ref 8.9–10.3)
Calcium: 8.9 mg/dL (ref 8.9–10.3)
Chloride: 96 mmol/L — ABNORMAL LOW (ref 98–111)
Chloride: 96 mmol/L — ABNORMAL LOW (ref 98–111)
Creatinine, Ser: 0.58 mg/dL — ABNORMAL LOW (ref 0.61–1.24)
Creatinine, Ser: 0.59 mg/dL — ABNORMAL LOW (ref 0.61–1.24)
GFR, Estimated: >60 mL/min (ref >60)
GFR, Estimated: >60 mL/min (ref >60)
Glucose, Bld: 129 mg/dL — ABNORMAL HIGH (ref 70–99)
Glucose, Bld: 143 mg/dL — ABNORMAL HIGH (ref 70–99)
Potassium: 3.8 mmol/L (ref 3.5–5.1)
Potassium: 3.8 mmol/L (ref 3.5–5.1)
Sodium: 126 mmol/L — ABNORMAL LOW (ref 135–145)
Sodium: 127 mmol/L — ABNORMAL LOW (ref 135–145)

## 2020-06-22 LAB — COMPREHENSIVE METABOLIC PANEL
ALT: 23 U/L (ref 0–44)
AST: 20 U/L (ref 15–41)
Albumin: 3.2 g/dL — ABNORMAL LOW (ref 3.5–5.0)
Alkaline Phosphatase: 46 U/L (ref 38–126)
Anion gap: 9 (ref 5–15)
BUN: 15 mg/dL (ref 8–23)
CO2: 22 mmol/L (ref 22–32)
Calcium: 8.5 mg/dL — ABNORMAL LOW (ref 8.9–10.3)
Chloride: 97 mmol/L — ABNORMAL LOW (ref 98–111)
Creatinine, Ser: 0.62 mg/dL (ref 0.61–1.24)
GFR, Estimated: >60 mL/min (ref >60)
Glucose, Bld: 127 mg/dL — ABNORMAL HIGH (ref 70–99)
Potassium: 3.6 mmol/L (ref 3.5–5.1)
Sodium: 128 mmol/L — ABNORMAL LOW (ref 135–145)
Total Bilirubin: 1 mg/dL (ref 0.3–1.2)
Total Protein: 5.8 g/dL — ABNORMAL LOW (ref 6.5–8.1)

## 2020-06-22 LAB — VITAMIN B12: Vitamin B-12: 1199 pg/mL — ABNORMAL HIGH (ref 180–914)

## 2020-06-22 LAB — GLUCOSE, CAPILLARY
Glucose-Capillary: 109 mg/dL — ABNORMAL HIGH (ref 70–99)
Glucose-Capillary: 285 mg/dL — ABNORMAL HIGH (ref 70–99)
Glucose-Capillary: 164 mg/dL — ABNORMAL HIGH (ref 70–99)
Glucose-Capillary: 117 mg/dL — ABNORMAL HIGH (ref 70–99)

## 2020-06-22 LAB — SODIUM, URINE, RANDOM: Sodium, Ur: 88 mmol/L

## 2020-06-22 LAB — TSH
TSH: 2.761 u[IU]/mL (ref 0.350–4.500)
TSH: 2.632 u[IU]/mL (ref 0.350–4.500)

## 2020-06-22 LAB — LIPID PANEL
Cholesterol: 130 mg/dL (ref 0–200)
HDL: 49 mg/dL (ref >40)
LDL Cholesterol: 72 mg/dL (ref 0–99)
Total CHOL/HDL Ratio: 2.7 RATIO
Triglycerides: 44 mg/dL (ref <150)
VLDL: 9 mg/dL (ref 0–40)

## 2020-06-22 LAB — HEMOGLOBIN A1C
Hgb A1c MFr Bld: 7.7 % — ABNORMAL HIGH (ref 4.8–5.6)
Mean Plasma Glucose: 174.29 mg/dL

## 2020-06-22 LAB — OSMOLALITY: Osmolality: 272 mOsm/kg — ABNORMAL LOW (ref 275–295)

## 2020-06-22 LAB — AMMONIA: Ammonia: 15 umol/L (ref 9–35)

## 2020-06-22 LAB — HIV ANTIBODY (ROUTINE TESTING W REFLEX): HIV Screen 4th Generation wRfx: NONREACTIVE

## 2020-06-22 LAB — OSMOLALITY, URINE: Osmolality, Ur: 392 mOsm/kg (ref 300–900)

## 2020-06-22 LAB — MAGNESIUM: Magnesium: 1.3 mg/dL — ABNORMAL LOW (ref 1.7–2.4)

## 2020-06-22 LAB — FOLATE: Folate: 14.3 ng/mL (ref >5.9)

## 2020-06-22 MED ORDER — SODIUM CHLORIDE 0.9 % IV SOLN: INTRAVENOUS | Status: AC

## 2020-06-22 MED ORDER — THIAMINE HCL 100 MG PO TABS
250.0000 mg | ORAL_TABLET | Freq: Every day | ORAL | Status: DC
  Administered 2020-06-22 – 2020-06-24 (×3): 250 mg via ORAL
  Filled 2020-06-22 (×3): qty 3

## 2020-06-22 MED ORDER — INSULIN ASPART 100 UNIT/ML ~~LOC~~ SOLN
0.0000 [IU] | Freq: Three times a day (TID) | SUBCUTANEOUS | Status: DC
  Administered 2020-06-22 – 2020-06-23 (×3): 3 [IU] via SUBCUTANEOUS
  Administered 2020-06-23: 2 [IU] via SUBCUTANEOUS
  Administered 2020-06-24: 3 [IU] via SUBCUTANEOUS
  Administered 2020-06-24 – 2020-06-25 (×4): 2 [IU] via SUBCUTANEOUS

## 2020-06-22 MED ORDER — MAGNESIUM SULFATE 4 GM/100ML IV SOLN
4.0000 g | Freq: Once | INTRAVENOUS | Status: AC
  Administered 2020-06-22: 4 g via INTRAVENOUS
  Filled 2020-06-22: qty 100

## 2020-06-22 MED ORDER — ONDANSETRON HCL 4 MG/2ML IJ SOLN: 4.0000 mg | Freq: Four times a day (QID) | INTRAMUSCULAR | Status: DC | PRN

## 2020-06-22 MED ORDER — CEFTRIAXONE SODIUM 1 G IJ SOLR
1.0000 g | INTRAMUSCULAR | Status: DC
  Administered 2020-06-22 – 2020-06-23 (×2): 1 g via INTRAVENOUS
  Filled 2020-06-22 (×3): qty 10

## 2020-06-22 MED ORDER — SODIUM CHLORIDE 0.9 % IV SOLN: INTRAVENOUS | Status: DC

## 2020-06-22 MED ORDER — POLYETHYLENE GLYCOL 3350 17 G PO PACK: 17.0000 g | PACK | Freq: Every day | ORAL | Status: DC | PRN

## 2020-06-22 MED ORDER — ACETAMINOPHEN 325 MG PO TABS: 650.0000 mg | ORAL_TABLET | Freq: Four times a day (QID) | ORAL | Status: DC | PRN

## 2020-06-22 MED ORDER — PANTOPRAZOLE SODIUM 40 MG PO TBEC
40.0000 mg | DELAYED_RELEASE_TABLET | Freq: Every day | ORAL | Status: DC
  Administered 2020-06-23 – 2020-06-24 (×2): 40 mg via ORAL
  Filled 2020-06-22 (×2): qty 1

## 2020-06-22 MED ORDER — PANTOPRAZOLE SODIUM 20 MG PO TBEC
20.0000 mg | DELAYED_RELEASE_TABLET | Freq: Every day | ORAL | Status: DC
  Administered 2020-06-22: 20 mg via ORAL
  Filled 2020-06-22: qty 1

## 2020-06-22 MED ORDER — SACUBITRIL-VALSARTAN 24-26 MG PO TABS
1.0000 | ORAL_TABLET | Freq: Two times a day (BID) | ORAL | Status: DC
  Administered 2020-06-22 – 2020-06-24 (×6): 1 via ORAL
  Filled 2020-06-22 (×9): qty 1

## 2020-06-22 MED ORDER — CARVEDILOL 6.25 MG PO TABS
6.2500 mg | ORAL_TABLET | Freq: Two times a day (BID) | ORAL | Status: DC
  Administered 2020-06-22 – 2020-06-25 (×7): 6.25 mg via ORAL
  Filled 2020-06-22 (×7): qty 1

## 2020-06-22 MED ORDER — ADULT MULTIVITAMIN W/MINERALS CH
1.0000 | ORAL_TABLET | Freq: Every day | ORAL | Status: DC
  Administered 2020-06-22 – 2020-06-24 (×3): 1 via ORAL
  Filled 2020-06-22 (×3): qty 1

## 2020-06-22 MED ORDER — VITAMIN B-12 1000 MCG PO TABS
1000.0000 ug | ORAL_TABLET | Freq: Every day | ORAL | Status: DC
  Administered 2020-06-22 – 2020-06-24 (×3): 1000 ug via ORAL
  Filled 2020-06-22 (×3): qty 1

## 2020-06-22 MED ORDER — ONDANSETRON HCL 4 MG PO TABS: 4.0000 mg | ORAL_TABLET | Freq: Four times a day (QID) | ORAL | Status: DC | PRN

## 2020-06-22 MED ORDER — ENOXAPARIN SODIUM 40 MG/0.4ML ~~LOC~~ SOLN
40.0000 mg | SUBCUTANEOUS | Status: DC
  Administered 2020-06-22 – 2020-06-24 (×3): 40 mg via SUBCUTANEOUS
  Filled 2020-06-22 (×3): qty 0.4

## 2020-06-22 MED ORDER — ENSURE ENLIVE PO LIQD
237.0000 mL | Freq: Two times a day (BID) | ORAL | Status: DC
  Administered 2020-06-22 – 2020-06-24 (×6): 237 mL via ORAL

## 2020-06-22 MED ORDER — ACETAMINOPHEN 650 MG RE SUPP: 650.0000 mg | Freq: Four times a day (QID) | RECTAL | Status: DC | PRN

## 2020-06-22 NOTE — H&P (Addendum)
History and Physical    Georgios Kina ZGY:[id number] DOB: Jun 05, 1946 DOA: 06/21/2020  PCP: Sandi Mariscal, MD  Patient coming from: Home   Chief Complaint:  Chief Complaint  Patient presents with  . Weight Loss     HPI:    74 year old male with past medical history of diabetes mellitus type 2, hypertension, coronary artery disease, congestive heart failure and diabetic polyneuropathy who presents to [facility] emergency department with complaints of progressive generalized weakness.  Patient explains that for at least the past 6 months he has been experiencing unintentional weight loss.  Patient approximates that he is lost at least 30 pounds over the span of time.  Alongside this unintentional weight loss patient has gradually developed associated progressively worsening weakness.  Patient denies fevers, night sweats, recent travel.  Patient denies intravenous drug use.  Patient symptoms have progressively worsened until he presented to [facility] emergency department on 11/4.  During that presentation patient underwent pan CT imaging to eval any underlying evidence of malignancy.  This simply revealed thickening of the lower portion of the esophagus.  Patient is now following with Dr. Havery Moros with gastroenterology concerning this and is to undergo endoscopic work-up in January  Concerning patient's progressively worsening difficulty with ambulation and weakness, patient has been following with Dr. 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  Despite these diagnoses and work-ups, patient continues to complain of progressively worsening generalized weakness alongside his unintentional weight loss.  Patient denies chest pain or shortness of breath.\  Patient eventually once again presented to [facility] emergency department for evaluation.  During this  presentation the patient was found to exhibit a moderate hyponatremia with sodium of 123, down from 136 in early November.  Additionally, patient's been found to have a urinalysis suggestive of urinary tract infection.  Patient has been initiated on intravenous fluids by the emergency department provider as well as being administered intravenous ceftriaxone for suspected urinary tract infection.  The hospitalist group is also been called to assess the patient for mission to the hospital.  Review of Systems:   Review of Systems  Constitutional: Positive for malaise/fatigue.  Neurological: Positive for weakness.    Past Medical History:  Diagnosis Date  . Aortic atherosclerosis ([place])   . CAD (coronary artery disease)   . Cardiomyopathy ([place])   . CHF (congestive heart failure) ([place])   . Diverticulosis   . Hiatal hernia   . 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 HERNIA REPAIR       reports that he has never smoked. He has never used smokeless tobacco. He reports that he does not drink alcohol and does not use drugs.  No Known Allergies  Family History  Problem Relation Age of Onset  . Colon cancer Neg Hx   . Esophageal cancer Neg Hx   . Stomach cancer Neg Hx      Prior to Admission medications   Medication Sig Start Date End Date Taking? Authorizing Provider  carvedilol (COREG) 6.25 MG tablet Take 6.25 mg by mouth 2 (two) times daily. 04/05/20  Yes [provider]  [employer] CALCIUM 600 MG tablet Take 1 tablet by mouth 2 (two) times daily. 04/05/20  Yes [provider]  ENTRESTO 24-26 MG Take 1 tablet by mouth 2 (two) times daily.  05/14/20  Yes [provider]  metFORMIN (GLUCOPHAGE) 1000 MG tablet Take 500 mg by mouth 2 (two) times daily. 05/24/20  Yes [provider]  cephALEXin (KEFLEX) 500 MG capsule Take 1 capsule (500 mg total) by mouth 3 (three) times daily. 05/17/20    Drenda Freeze, MD  pantoprazole (PROTONIX) 20 MG tablet Take 1 tablet (20 mg total) by mouth daily. 05/17/20   Drenda Freeze, MD  spironolactone (ALDACTONE) 25 MG tablet Take 25 mg by mouth daily. 04/05/20   [provider]    Physical Exam: Vitals:   06/21/20 2234 06/21/20 2246 06/22/20 0106 06/22/20 0500  BP: 120/78  122/76 121/75  Pulse: 78  80   Resp: 18  18 18   Temp: 97.8 F (36.6 C)  98 F (36.7 C) 98.1 F (36.7 C)  TempSrc:   Oral Oral  SpO2: 100%  100% 100%  Weight:  55.3 kg    Height:  5\' 11"  (1.803 m)      Constitutional: Acute alert and oriented x3, no associated distress.  Patient is cachectic. Skin: no rashes, no lesions, notable poor skin turgor. Eyes: Pupils are equally reactive to light.  No evidence of scleral icterus or conjunctival pallor.  ENMT: Extremely poor dentition noted.  Dry mucous membranes noted.  Posterior pharynx clear of any exudate or lesions.   Neck: normal, supple, no masses, no thyromegaly.  No evidence of jugular venous distension.   Respiratory: clear to auscultation bilaterally, no wheezing, no crackles. Normal respiratory effort. No accessory muscle use.  Cardiovascular: Regular rate and rhythm, no murmurs / rubs / gallops. No extremity edema. 2+ pedal pulses. No carotid bruits.  Chest:   Nontender without crepitus or deformity.   Back:   Nontender without crepitus or deformity. Abdomen: Abdomen is soft and nontender.  No evidence of intra-abdominal masses.  Positive bowel sounds noted in all quadrants.   Musculoskeletal: No joint deformity upper and lower extremities. Good ROM, no contractures.  Extremely poor muscle tone noted. Neurologic: CN 2-12 grossly intact. Sensation intact.  Patient moving all 4 extremities spontaneously.  Patient is following all commands.  Patient is responsive to verbal stimuli.   Psychiatric: Patient exhibits depressed mood with flat affect.  Patient seems to possess insight as to their current  situation.     Labs on Admission: I have personally reviewed following labs and imaging studies -   CBC: Recent Labs  Lab 06/21/20 1658  WBC 8.5  HGB 13.7  HCT 37.4*  MCV 92.8  PLT 606   Basic Metabolic Panel: Recent Labs  Lab 06/21/20 1658 06/22/20 0251  NA 123* 126*  K 4.3 3.8  CL 92* 96*  CO2 23 22  GLUCOSE 137* 129*  BUN 25* 17  CREATININE 0.65 0.58*  CALCIUM 8.9 8.4*   GFR: Estimated Creatinine Clearance: 65.3 mL/min (A) (by C-G formula based on SCr of 0.58 mg/dL (L)). Liver Function Tests: Recent Labs  Lab 06/21/20 1940  AST 24  ALT 26  ALKPHOS 50  BILITOT 0.8  PROT 6.7  ALBUMIN 3.8   Recent Labs  Lab 06/21/20 1940  LIPASE 54*   Recent Labs  Lab 06/22/20 0251  AMMONIA 15   Coagulation Profile: Recent Labs  Lab 06/21/20 1940  INR 1.1   Cardiac Enzymes: Recent Labs  Lab 06/21/20 1940  CKTOTAL 122   BNP (last 3 results) No results for input(s): PROBNP in the last 8760 hours. HbA1C: Recent Labs    06/22/20 0251  HGBA1C  7.7*   CBG: Recent Labs  Lab 06/21/20 1700 06/21/20 2103  GLUCAP 138* 135*   Lipid Profile: No results for input(s): CHOL, HDL, LDLCALC, TRIG, CHOLHDL, LDLDIRECT in the last 72 hours. Thyroid Function Tests: Recent Labs    06/22/20 0251  TSH 2.632   Anemia Panel: Recent Labs    06/22/20 0251  VITAMINB12 1,199*  FOLATE 14.3   Urine analysis:    Component Value Date/Time   COLORURINE YELLOW 06/21/2020 1659   APPEARANCEUR CLOUDY (A) 06/21/2020 1659   LABSPEC 1.015 06/21/2020 1659   PHURINE 6.0 06/21/2020 1659   GLUCOSEU NEGATIVE 06/21/2020 1659   HGBUR NEGATIVE 06/21/2020 1659   BILIRUBINUR NEGATIVE 06/21/2020 1659   KETONESUR NEGATIVE 06/21/2020 1659   PROTEINUR NEGATIVE 06/21/2020 1659   NITRITE POSITIVE (A) 06/21/2020 1659   LEUKOCYTESUR MODERATE (A) 06/21/2020 1659    Radiological Exams on Admission - Personally Reviewed: No results found.  EKG: Personally reviewed.  Rhythm is sinus  rhythm with heart rate of 87 bpm.  No dynamic ST segment changes appreciated.  Assessment/Plan Principal Problem:   Generalized weakness   Patient presenting with progressively worsening weakness progressively worsening over the past several months associated with unintentional weight loss  During this presentation patient has been identified to be suffering from a moderate hyponatremia as well as exhibiting a urinalysis suggestive of urinary tract infection  While moderate hyponatremia certainly is not the primary driver of the patient's weakness, could be responsible for exacerbating as of late, managing this with gentle intravenous hydration and close monitoring of sodium levels to ensure that this is not overcorrected.  Urinalysis is suggestive of urinary tract infection.  While patient denies any abdominal pain dysuria or fever, urinary tract infection could possibly be contributing to the patient's generalized weakness and therefore intravenous ceftriaxone initiated by the emergency department staff will be continued at this time.   PT evaluation ordered for the morning   Hyponatremia  Gentle intravenous hydration with isotonic fluids  Monitoring sodium levels with chemistries every 4 hours for now to minimize risk of overcorrection  Working up hyponatremia with serum osmolality, urine osmolality, urine sodium and TSH  Active Problems:   Complicated UTI (urinary tract infection)  It is conceivable that a urinary tract infection could be contributing to patient's generalized weakness.  Continuing intravenous ceftriaxone initiated by the emergency department staff  Awaiting final urine culture results for de-escalation of antibiotics    Weight loss, unintentional   Exact etiology of patient's unintentional weight loss is not clear at this time  Patient to undergo endoscopic work-up next month by Dr. Havery Moros due to notable lower esophageal thickening on recent CT  imaging  Nutrition consultation placed, recommendations appreciated  Multivitamin and Ensure twice daily ordered    Uncontrolled type 2 diabetes mellitus with diabetic polyneuropathy, without long-term current use of insulin ([place])   Accu-Cheks before every meal and nightly with sliding scale insulin  Holding home regimen of oral hypoglycemics  Hemoglobin A1c pending    Chronic systolic congestive heart failure (H[facility])   Continue home regimen of Entresto  No evidence of cardiogenic volume overload    Coronary artery disease  Patient currently chest pain-free  Monitoring patient on telemetry  Severe protein calorie malnutrition   84-month history of progressive weight loss and poor appetite  Patient is cachectic upon examination with temporal muscle wasting and poor muscle tone  BMI is 17.02  Ensure nutrition supplements ordered  Daily multivitamin ordered  Nutrition evaluation ordered, their input is  appreciated   Code Status:  Full code Family Communication: deferred   Status is: Observation  The patient remains OBS appropriate and will d/c before 2 midnights.  Dispo: The patient is from: Home              Anticipated d/c is to: SNF              Anticipated d/c date is: 2 days              Patient currently is not medically stable to d/c.        Vernelle Emerald MD [facility] Pager [phone]  If 7PM-7AM, please contact night-coverage [email or web address] Use universal [place] password for that web site. If you do not have the password, please call the hospital operator.  06/22/2020, 5:06 AM

## 2020-06-22 NOTE — Progress Notes (Signed)
Initial Nutrition Assessment  DOCUMENTATION CODES:   Underweight,Severe malnutrition in context of chronic illness  INTERVENTION:   -Liberalize diet to regular; spoke with MD regarding recommendation -MVI with minerals daily -Continue Ensure Enlive po BID, each supplement provides 350 kcal and 20 grams of protein -Referred pt to outpatient diabetes education through [facility] per pt request  NUTRITION DIAGNOSIS:   Severe Malnutrition related to chronic illness (CHF, DM) as evidenced by energy intake < or equal to 75% for > or equal to 1 month,severe fat depletion,severe muscle depletion,percent weight loss.  GOAL:   Patient will meet greater than or equal to 90% of their needs  MONITOR:   PO intake,Supplement acceptance,Diet advancement,Labs,Weight trends,Skin,I & O's  REASON FOR ASSESSMENT:   Consult Assessment of nutrition requirement/status,Poor PO  ASSESSMENT:   74 year old male with past medical history of diabetes mellitus type 2, hypertension, coronary artery disease, congestive heart failure and diabetic polyneuropathy who presents to [place] [facility] emergency department with complaints of progressive generalized weakness.  Pt admitted with generalized weakness and hyponatremia.   Reviewed I/O's: -400 ml x 24 hours  UOP: 400 ml x 24 hours  Spoke with pt at bedside, who reports a general decline health over the past 6 months, experiencing poor appetite, weight loss, weakness, and decreased mobility/ balance. Per pt, he has been less steady on his feet and started noticing muscle atrophy. Pt shares he usually consumes about 2 meals per day, but this has significantly declined over time. Over the past month, he has only been able to consume one meal per day and was only about to consume 50-75% of that meal.   Since hospitalization, intake has improved. Pt consumed 100% of breakfast. He denies any difficulty chewing or  swallowing foods.   Per pt, his UBW is around 150#. He estimates he has lost about 25-30# within the past 6 months. Reviewed wt hx; pt has experienced a 5.5% wt loss over the past month, which is significant for time frame.   Per pt, he was recently diagnosed with DM about a month ago. He reports he received no instruction or education on DM. He started taking metformin. He shares with this RD that his friend purchased a glucometer for him and was obtaining readings between 60-90 (he started receiving numbers around low 100's PTA). RD discussed signs and symptoms of hypoglycemia and how to correct it (rule of 15). Pt shares that in the past, he would consume orange juice and honey buns- RD discussed role of fat on delayed carbohydrate absorption and encouraged use of simple carbohydrates to correct hypoglycemia. Pt also requesting referral to outpatient DM education, which this RD will order.   RD discussed importance of good meal and supplement intake to promote healing. Pt amenable to continue Ensure supplements. Due to malnutrition, RD also messaged MD and received permission to liberalize diet to regular to optimize oral intake.    Medications reviewed and include 0.9% sodium chloride infusion @ 50 ml/hr.   Lab Results  Component Value Date   HGBA1C 7.7 (H) 06/22/2020   PTA DM medications are 500 mg metformin BID.   Labs reviewed: Na: 128, CBGS: 135 (inpatient orders for glycemic control are 0-15 units inuslin aspart TID at meals and at bedtime).   NUTRITION - FOCUSED PHYSICAL EXAM:  Flowsheet Row Most Recent Value  Orbital Region Severe depletion  Upper Arm Region Severe depletion  Thoracic and Lumbar Region Severe depletion  Buccal Region Severe  depletion  Temple Region Severe depletion  Clavicle Bone Region Severe depletion  Clavicle and Acromion Bone Region Severe depletion  Scapular Bone Region Severe depletion  Dorsal Hand Severe depletion  Patellar Region Severe depletion   Anterior Thigh Region Severe depletion  Posterior Calf Region Severe depletion  Edema (RD Assessment) None  Hair Reviewed  Eyes Reviewed  Mouth Reviewed  Skin Reviewed  Nails Reviewed       Diet Order:   Diet Order            Diet regular Room service appropriate? Yes with Assist; Fluid consistency: Thin  Diet effective now                 EDUCATION NEEDS:   Education needs have been addressed  Skin:  Skin Assessment: Reviewed RN Assessment  Last BM:  06/21/20  Height:   Ht Readings from Last 1 Encounters:  06/21/20 5\' 11"  (1.803 m)    Weight:   Wt Readings from Last 1 Encounters:  06/21/20 55.3 kg    Ideal Body Weight:  78.2 kg  BMI:  Body mass index is 17.02 kg/m.  Estimated Nutritional Needs:   Kcal:  2200-2400  Protein:  110-125 grams  Fluid:  > 2 L    Loistine Chance, RD, LDN, [place] Registered Dietitian II Certified Diabetes Care and Education Specialist Please refer to [facility] for RD and/or RD on-call/weekend/after hours pager

## 2020-06-22 NOTE — Evaluation (Signed)
Physical Therapy Evaluation Patient Details Name: Aaron Rush MRN: [id number] DOB: [birth year] Today's Date: 06/22/2020   History of Present Illness  Pt is a 74 y.o. male admitted 06/21/20 with c/o progressive generalized weakness and unintentional weight loss; etiology still unknown. Workup revealed UTI. Recent CT imaging without malignancy. PMH includes uncontrolled DM2, diabetic polyneuropathy, CAD, CHF, stroke.    Clinical Impression  Pt presents with an overall decrease in functional mobility secondary to above. Prior to onset of weakness ~40-months ago, pt working, driving, active and lives alone. Since progressive weakness, pt ambulating with RW, has stopped driving and stopped working; sister provides transportation, and pt has been working with Outpatient Physical Therapy (notes strength improving with this). Today, pt able to transfer and ambulate with RW and min guard for balance. Pt with notable BLE weakness and atrophy, especially L foot drop (to get AFO for this from OP PT). Increased time discussing safe d/c options; pt plans to return home with continued support from family/friends and OP PT appts. Pt would benefit from continued acute PT services to maximize functional mobility and independence prior to d/c home.     Follow Up Recommendations Outpatient PT;Supervision - Intermittent (continue OP PT)    Equipment Recommendations  None recommended by PT    Recommendations for Other Services       Precautions / Restrictions Precautions Precautions: Fall;Other (comment) Precaution Comments: L foot drop Restrictions Weight Bearing Restrictions: No      Mobility  Bed Mobility Overal bed mobility: Modified Independent Bed Mobility: Supine to Sit           General bed mobility comments: HOB elevated, use of bed rail    Transfers Overall transfer level: Needs assistance Equipment used: Rolling walker (2 wheeled) Transfers: Sit to/from Stand Sit to Stand: Min  guard         General transfer comment: Reliant on momentum to power into standing to RW, min guard for balance  Ambulation/Gait Ambulation/Gait assistance: Min guard Gait Distance (Feet): 10 Feet Assistive device: Rolling walker (2 wheeled) Gait Pattern/deviations: Step-through pattern;Decreased stride length;Decreased dorsiflexion - left Gait velocity: Decreased   General Gait Details: Slow, mildly unsteady gait with RW and L foot drop, min guard for balance; limited by fatigue and RN present to administer meds  Stairs            Wheelchair Mobility    Modified Rankin (Stroke Patients Only)       Balance Overall balance assessment: Mild deficits observed, not formally tested                                           Pertinent Vitals/Pain Pain Assessment: No/denies pain    Home Living Family/patient expects to be discharged to:: Private residence Living Arrangements: Alone Available Help at Discharge: Family;Friend(s);Available PRN/intermittently Type of Home: House Home Access: Stairs to enter Entrance Stairs-Rails: Psychiatric nurse of Steps: 3 Home Layout: One level Home Equipment: Walker - 2 wheels;Shower seat;Grab bars - tub/shower;Grab bars - toilet Additional Comments: Supportive sister and multiple supportive friends (who are currently caring for pt's four dogs)    Prior Function Level of Independence: Needs assistance   Gait / Transfers Assistance Needed: Prior to weakness onset past 3 months, 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 Has had falls  ADL's / Homemaking Assistance Needed: Pt reports standing for showers or sitting on shower seat, mod indep with ADLs        Hand Dominance        Extremity/Trunk Assessment   Upper Extremity Assessment Upper  Extremity Assessment: RUE deficits/detail;LUE deficits/detail RUE Deficits / Details: Strength >/4/5 throughout; muscle atrophy LUE Deficits / Details: Strength >/4/5 throughout; muscle atrophy    Lower Extremity Assessment Lower Extremity Assessment: RLE deficits/detail;LLE deficits/detail RLE Deficits / Details: R hip flex/ext 3/5, knee ext 3+/5, knee flex 3/5, ankle DF/PF 3/5; muscle atrophy RLE Coordination: decreased gross motor;decreased fine motor LLE Deficits / Details: L hip flex 3/5, knee ext 3/5, knee flex 3/5, ankle DF 1/5, ankle PF 3/5; muscle atrophy LLE Coordination: decreased gross motor;decreased fine motor       Communication   Communication: No difficulties  Cognition Arousal/Alertness: Awake/alert Behavior During Therapy: WFL for tasks assessed/performed Overall Cognitive Status: Within Functional Limits for tasks assessed                                        General Comments General comments (skin integrity, edema, etc.): Increased time discussing safe d/c plan at end of session; pt declines SNF and HHPT, plans to return home and able to continue OP PT with transportation from sister. This PT in agreement with plan. Friend present and supportive; pt with multiple supportive friends who can continue to assist as needed. Pt reports he is supposed to get L foot AFO for foot drop from OP PT    Exercises     Assessment/Plan    PT Assessment Patient needs continued PT services  PT Problem List Decreased strength;Decreased activity tolerance;Decreased balance;Decreased mobility       PT Treatment Interventions DME instruction;Gait training;Stair training;Functional mobility training;Therapeutic activities;Therapeutic exercise;Balance training;Patient/family education    PT Goals (Current goals can be found in the Care Plan section)  Acute Rehab PT Goals Patient Stated Goal: Return home and continue working on strength/balance with OP PT PT Goal  Formulation: With patient Time For Goal Achievement: 07/06/20 Potential to Achieve Goals: Good    Frequency Min 3X/week   Barriers to discharge        Co-evaluation               AM-PAC PT "6 Clicks" Mobility  Outcome Measure Help needed turning from your back to your side while in a flat bed without using bedrails?: None Help needed moving from lying on your back to sitting on the side of a flat bed without using bedrails?: None Help needed moving to and from a bed to a chair (including a wheelchair)?: A Little Help needed standing up from a chair using your arms (e.g., wheelchair or bedside chair)?: A Little Help needed to walk in hospital room?: A Little Help needed climbing 3-5 steps with a railing? : A Little 6 Click Score: 20    End of Session   Activity Tolerance: Patient tolerated treatment well;Patient limited by fatigue Patient left: in chair;with call bell/phone within reach;with chair alarm set;with family/visitor present Nurse Communication: Mobility status PT Visit Diagnosis: Other abnormalities of gait and mobility (R26.89);Muscle weakness (generalized) (M62.81);Repeated falls (R29.6)    Time: 9563-8756 PT Time Calculation (min) (ACUTE ONLY): 29 min   Charges:   PT Evaluation $PT Eval Moderate Complexity: 1 Mod     Arcangel Minion  Matthew Saras, DPT Acute Rehabilitation Services  Pager [phone] Office [place] 06/22/2020, 9:47 AM

## 2020-06-22 NOTE — Progress Notes (Signed)
PROGRESS NOTE  Aaron Rush OEV:[id number] DOB: 1945-11-15   PCP: Sandi Mariscal, MD  Patient is from: Home.  Lives alone.  Lately uses walker.  DOA: 06/21/2020 LOS: 0  Chief complaints: Generalized weakness and weight loss  Brief Narrative / Interim history: 74 year old male with history of systolic CHF, DM-2, CAD, CVA, HTN and possible esophagitis presented to [facility] with progressive generalized weakness and unintentional weight loss, and admitted for hyponatremia (Na 123), generalized weakness and failure to thrive. UA with nitrite, LE and many bacteria although he had no UTI symptoms.  He was started on IV ceftriaxone.  Reports about 30 pounds unintentional weight loss in about 6 months.  Had pan CT on 11/4 that was unrevealing except for thickening of distal esophagus for which she was referred to GI.  He is planned to have EGD in January 2022.  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  Of note, his weight from 11/2017 to 05/17/2020 stable at 59 kg although patient reports interval weight gain during that period.  Patient never had colonoscopy.   Subjective: Seen and examined earlier this morning.  No major events overnight of this morning.  No complaints other than ongoing generalized weakness.  He denies chest pain, dyspnea, cough, GI or UTI symptoms.  Denies smoking cigarettes, drinking alcohol recreational drug use.  Objective: Vitals:   06/21/20 2246 06/22/20 0106 06/22/20 0500 06/22/20 1359  BP:  122/76 121/75 110/69  Pulse:  80  80  Resp:  _0 Temp:  98 F (36.7 C) 98.1 F (36.7 C) 98.2 F (36.8 C)  TempSrc:  Oral Oral Oral  SpO2:  100% 100% 99%  Weight: 55.3 kg     Height: _1  (1.803 m)       Intake/Output Summary (Last 24 hours) at 06/22/2020 1606 Last data filed at 06/22/2020 1359 Gross per 24 hour  Intake 237 ml  Output 1000 ml  Net -763 ml   Filed  Weights   06/21/20 1647 06/21/20 2246  Weight: 55.3 kg 55.3 kg    Examination:  GENERAL: Frail looking elderly male.  No apparent distress. HEENT: MMM.  Vision and hearing grossly intact.  NECK: Supple.  No apparent JVD.  RESP:  No IWOB.  Fair aeration bilaterally. CVS:  RRR. Heart sounds normal.  ABD/GI/GU: BS+. Abd soft, NTND.  MSK/EXT:  Moves extremities. No apparent deformity. No edema.  SKIN: no apparent skin lesion or wound NEURO: Awake, alert and oriented appropriately.  No apparent focal neuro deficit. PSYCH: Calm. Normal affect.   Procedures:  None  Microbiology summarized: [id number] and influenza PCR nonreactive. Urine culture pending.  Assessment & Plan: Hyponatremia: Na 136 on 10/4> 123 on admit>> 128>> 127.  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  Hyponatremia responded to IV normal saline.  However, he also have elevated urine sodium to 88 suggesting SIADH.  TSH within normal.  Aldactone and Entresto on his med list but patient denies taking Aldactone.  He has no hyperkalemia either. -Continue IV NS at 75 cc an hour -Monitor sodium every 8 hours -Check a.m. cortisol  Generalized weakness: unclear etiology of this but concerning given associated failure to thrive and unintentional weight loss.  Recent CT chest/abdomen/pelvis unrevealing except for distal esophageal thickness but patient denies esophageal dysphagia.  Had extensive work-up by neurologist on 11/11 that were unrevealing.  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  CRP and ESR negative. -Continue PT/OT  Possible esophagitis: CT revealed distal esophageal thickening concerning for esophagitis -Increase p.o. Protonix to 40 mg daily -Patient has upcoming appointment for EGD with Dr. Havery Moros next month.  Uncontrolled NIDDM-2: A1c 7.7%.  He is on Metformin at home which could potentially contribute to weight loss Recent Labs  Lab 06/21/20 1700  06/21/20 2103 06/22/20 0849 06/22/20 1154  GLUCAP 138* 135* 109* 285*  -Continue SSI-moderate -Needs to be on a statin.  Chronic systolic CHF: Unknown EF.  No TEE report in his EMR or Care Everywhere.  Now seems to be dehydrated.  He is not on Lasix at home.  Aldactone and Entresto listed on his chart but not taking Aldactone. -Continue home Entresto and Coreg -Monitor fluid status while on IV fluid  History of CAD: Stable.  No anginal symptoms. -Coreg and Entresto as above -Needs to be on statin  Possible UTI: UA with nitrites, large LE and many bacteria but he denies UTI symptoms.  No fever or leukocytosis. -Complete 3 days of IV ceftriaxone  Hypomagnesemia: Mg 1.3. -IV magnesium sulfate 4 g  Pulmonary nodules: CT chest on 05/17/2020 revealed apical pulmonary nodules with the largest one measuring 8 mm -Repeat CT in 3 to 6 months recommended.  Severe malnutrition/failure to thrive/unintentional weight loss-significant muscle mass and subcu fat loss.  Unclear etiology but uncontrolled diabetes and Metformin could contribute some.  His CHF seems to be compensated.  His recent CT suggestive for distal esophagitis and apical pulmonary nodules.  CRP and ESR within normal range. Body mass index is 17.02 kg/m.    Wt Readings from Last 10 Encounters:  06/21/20 55.3 kg  05/24/20 55.8 kg  05/22/20 58.5 kg  05/17/20 59 kg  12/07/17 59 kg  -Liberated diet -Appreciate input by dietitian       DVT prophylaxis:  enoxaparin (LOVENOX) injection 40 mg Start: 06/22/20 1000  Code Status: Full code Family Communication: Patient and/or RN. Available if any question.  Status is: Observation  The patient will require care spanning > 2 midnights and should be moved to inpatient because: Persistent severe electrolyte disturbances, Unsafe d/c plan, IV treatments appropriate due to intensity of illness or inability to take PO and Inpatient level of care appropriate due to severity of  illness  Dispo: The patient is from: Home              Anticipated d/c is to: Home              Anticipated d/c date is: 2 days              Patient currently is not medically stable to d/c.       Consultants:  None   Sch Meds:  Scheduled Meds: . carvedilol  6.25 mg Oral BID WC  . enoxaparin (LOVENOX) injection  40 mg Subcutaneous Q24H  . feeding supplement  237 mL Oral BID BM  . insulin aspart  0-15 Units Subcutaneous TID AC & HS  . multivitamin with minerals  1 tablet Oral Daily  . [START ON 06/23/2020] pantoprazole  40 mg Oral Daily  . sacubitril-valsartan  1 tablet Oral BID  . vitamin B-1  250 mg Oral Daily  . vitamin B-12  1,000 mcg Oral Daily   Continuous Infusions: . sodium chloride 50 mL/hr at 06/22/20 0859  . cefTRIAXone (ROCEPHIN)  IV     PRN Meds:.acetaminophen **OR** acetaminophen, ondansetron **OR** ondansetron (ZOFRAN) IV, polyethylene glycol  Antimicrobials:  Anti-infectives (From admission, onward)   Start     Dose/Rate Route Frequency Ordered Stop   06/22/20 2000  cefTRIAXone (ROCEPHIN) 1 g in sodium chloride 0.9 % 100 mL IVPB        1 g 200 mL/hr over 30 Minutes Intravenous Every 24 hours 06/22/20 0459 06/28/20 1959   06/21/20 1930  cefTRIAXone (ROCEPHIN) 1 g in sodium chloride 0.9 % 100 mL IVPB        1 g 200 mL/hr over 30 Minutes Intravenous  Once 06/21/20 1918 06/21/20 2045       I have personally reviewed the following labs and images: CBC: Recent Labs  Lab 06/21/20 1658 06/22/20 0445  WBC 8.5 10.6*  NEUTROABS  --  8.4*  HGB 13.7 13.3  HCT 37.4* 36.2*  MCV 92.8 92.3  PLT 235 201   BMP &GFR Recent Labs  Lab 06/21/20 1658 06/22/20 0251 06/22/20 0445 06/22/20 1008  NA 123* 126* 128* 127*  K 4.3 3.8 3.6 3.8  CL 92* 96* 97* 96*  CO2 _0 GLUCOSE 137* 129* 127* 143*  BUN 25* _1 CREATININE 0.65 0.58* 0.62 0.59*  CALCIUM 8.9 8.4* 8.5* 8.9  MG  --   --  1.3*  --    Estimated Creatinine Clearance: 65.3 mL/min (A)  (by C-G formula based on SCr of 0.59 mg/dL (L)). Liver & Pancreas: Recent Labs  Lab 06/21/20 1940 06/22/20 0445  AST 24 20  ALT 26 23  ALKPHOS 50 46  BILITOT 0.8 1.0  PROT 6.7 5.8*  ALBUMIN 3.8 3.2*   Recent Labs  Lab 06/21/20 1940  LIPASE 54*   Recent Labs  Lab 06/22/20 0251  AMMONIA 15   Diabetic: Recent Labs    06/22/20 0251  HGBA1C 7.7*   Recent Labs  Lab 06/21/20 1700 06/21/20 2103 06/22/20 0849 06/22/20 1154  GLUCAP 138* 135* 109* 285*   Cardiac Enzymes: Recent Labs  Lab 06/21/20 1940  CKTOTAL 122   No results for input(s): PROBNP in the last 8760 hours. Coagulation Profile: Recent Labs  Lab 06/21/20 1940  INR 1.1   Thyroid Function Tests: Recent Labs    06/22/20 0251  TSH 2.632   Lipid Profile: Recent Labs    06/22/20 0445  CHOL 130  HDL 49  LDLCALC 72  TRIG 44  CHOLHDL 2.7   Anemia Panel: Recent Labs    06/22/20 0251  VITAMINB12 1,199*  FOLATE 14.3   Urine analysis:    Component Value Date/Time   COLORURINE YELLOW 06/21/2020 1659   APPEARANCEUR CLOUDY (A) 06/21/2020 1659   LABSPEC 1.015 06/21/2020 1659   PHURINE 6.0 06/21/2020 1659   GLUCOSEU NEGATIVE 06/21/2020 1659   HGBUR NEGATIVE 06/21/2020 1659   BILIRUBINUR NEGATIVE 06/21/2020 1659   KETONESUR NEGATIVE 06/21/2020 1659   PROTEINUR NEGATIVE 06/21/2020 1659   NITRITE POSITIVE (A) 06/21/2020 1659   LEUKOCYTESUR MODERATE (A) 06/21/2020 1659   Sepsis Labs: Invalid input(s): PROCALCITONIN, [place]  Microbiology: Recent Results (from the past 240 hour(s))  Resp Panel by RT-PCR (Flu A&B, Covid) Nasopharyngeal Swab     Status: None   Collection Time: 06/21/20  7:40 PM   Specimen: Nasopharyngeal Swab; Nasopharyngeal(NP) swabs in vial transport medium  Result Value Ref Range Status   SARS Coronavirus 2 by RT PCR NEGATIVE NEGATIVE Final    Comment: (NOTE) SARS-CoV-2 target nucleic acids are NOT DETECTED.  The SARS-CoV-2 RNA is generally detectable in upper  respiratory specimens during the acute phase of infection.  The lowest concentration of SARS-CoV-2 viral copies this assay can detect is 138 copies/mL. A negative result does not preclude SARS-Cov-2 infection and should not be used as the sole basis for treatment or other patient management decisions. A negative result may occur with  improper specimen collection/handling, submission of specimen other than nasopharyngeal swab, presence of viral mutation(s) within the areas targeted by this assay, and inadequate number of viral copies(<138 copies/mL). A negative result must be combined with clinical observations, patient history, and epidemiological information. The expected result is Negative.  Fact Sheet for Patients:  EntrepreneurPulse.com.au  Fact Sheet for Healthcare Providers:  IncredibleEmployment.be  This test is no t yet approved or cleared by the [place] FDA and  has been authorized for detection and/or diagnosis of SARS-CoV-2 by FDA under an Emergency Use Authorization (EUA). This EUA will remain  in effect (meaning this test can be used) for the duration of the COVID-19 declaration under Section 564(b)(1) of the Act, 21 U.S.C.section 360bbb-3(b)(1), unless the authorization is terminated  or revoked sooner.       Influenza A by PCR NEGATIVE NEGATIVE Final   Influenza B by PCR NEGATIVE NEGATIVE Final    Comment: (NOTE) The Xpert Xpress SARS-CoV-2/FLU/RSV plus assay is intended as an aid in the diagnosis of influenza from Nasopharyngeal swab specimens and should not be used as a sole basis for treatment. Nasal washings and aspirates are unacceptable for Xpert Xpress SARS-CoV-2/FLU/RSV testing.  Fact Sheet for Patients: EntrepreneurPulse.com.au  Fact Sheet for Healthcare Providers: IncredibleEmployment.be  This test is not yet approved or cleared by the [place] FDA and has been  authorized for detection and/or diagnosis of SARS-CoV-2 by FDA under an Emergency Use Authorization (EUA). This EUA will remain in effect (meaning this test can be used) for the duration of the COVID-19 declaration under Section 564(b)(1) of the Act, 21 U.S.C. section 360bbb-3(b)(1), unless the authorization is terminated or revoked.  Performed at [facility], [address], [place], [place] [address]     Radiology Studies: No results found.    Adair Lemar T. Bandana  If 7PM-7AM, please contact night-coverage [email or web address] 06/22/2020, 4:06 PM

## 2020-06-23 DIAGNOSIS — R6251 Failure to thrive (child): Secondary | ICD-10-CM

## 2020-06-23 DIAGNOSIS — R918 Other nonspecific abnormal finding of lung field: Secondary | ICD-10-CM

## 2020-06-23 DIAGNOSIS — K209 Esophagitis, unspecified without bleeding: Secondary | ICD-10-CM

## 2020-06-23 DIAGNOSIS — I5042 Chronic combined systolic (congestive) and diastolic (congestive) heart failure: Secondary | ICD-10-CM

## 2020-06-23 DIAGNOSIS — E43 Unspecified severe protein-calorie malnutrition: Secondary | ICD-10-CM

## 2020-06-23 LAB — CBC
HCT: 35.3 % — ABNORMAL LOW (ref 39.0–52.0)
Hemoglobin: 12.6 g/dL — ABNORMAL LOW (ref 13.0–17.0)
MCH: 33.2 pg (ref 26.0–34.0)
MCHC: 35.7 g/dL (ref 30.0–36.0)
MCV: 93.1 fL (ref 80.0–100.0)
Platelets: 206 10*3/uL (ref 150–400)
RBC: 3.79 MIL/uL — ABNORMAL LOW (ref 4.22–5.81)
RDW: 12.2 % (ref 11.5–15.5)
WBC: 6.6 10*3/uL (ref 4.0–10.5)
nRBC: 0 % (ref 0.0–0.2)

## 2020-06-23 LAB — RENAL FUNCTION PANEL
Albumin: 3 g/dL — ABNORMAL LOW (ref 3.5–5.0)
Anion gap: 6 (ref 5–15)
BUN: 11 mg/dL (ref 8–23)
CO2: 23 mmol/L (ref 22–32)
Calcium: 8.2 mg/dL — ABNORMAL LOW (ref 8.9–10.3)
Chloride: 101 mmol/L (ref 98–111)
Creatinine, Ser: 0.6 mg/dL — ABNORMAL LOW (ref 0.61–1.24)
GFR, Estimated: >60 mL/min (ref >60)
Glucose, Bld: 141 mg/dL — ABNORMAL HIGH (ref 70–99)
Phosphorus: 2.1 mg/dL — ABNORMAL LOW (ref 2.5–4.6)
Potassium: 3.6 mmol/L (ref 3.5–5.1)
Sodium: 130 mmol/L — ABNORMAL LOW (ref 135–145)

## 2020-06-23 LAB — GLUCOSE, CAPILLARY
Glucose-Capillary: 117 mg/dL — ABNORMAL HIGH (ref 70–99)
Glucose-Capillary: 194 mg/dL — ABNORMAL HIGH (ref 70–99)
Glucose-Capillary: 195 mg/dL — ABNORMAL HIGH (ref 70–99)
Glucose-Capillary: 126 mg/dL — ABNORMAL HIGH (ref 70–99)

## 2020-06-23 LAB — MAGNESIUM: Magnesium: 1.9 mg/dL (ref 1.7–2.4)

## 2020-06-23 MED ORDER — POTASSIUM PHOSPHATES 15 MMOLE/5ML IV SOLN
20.0000 mmol | Freq: Once | INTRAVENOUS | Status: AC
  Administered 2020-06-23: 20 mmol via INTRAVENOUS
  Filled 2020-06-23: qty 6.67

## 2020-06-23 MED ORDER — SODIUM CHLORIDE 0.9 % IV SOLN: INTRAVENOUS | Status: DC

## 2020-06-23 MED ORDER — SODIUM CHLORIDE 1 G PO TABS
1.0000 g | ORAL_TABLET | Freq: Two times a day (BID) | ORAL | Status: DC
  Administered 2020-06-23 – 2020-06-24 (×3): 1 g via ORAL
  Filled 2020-06-23 (×3): qty 1

## 2020-06-23 NOTE — Progress Notes (Addendum)
PROGRESS NOTE  Aaron Rush GYK:[id number] DOB: 05-17-1946   PCP: Sandi Mariscal, MD  Patient is from: Home.  Lives alone.  Lately uses walker.  DOA: 06/21/2020 LOS: 1  Chief complaints: Generalized weakness and weight loss  Brief Narrative / Interim history: 74 year old male with history of systolic CHF, DM-2, CAD, CVA, HTN and possible esophagitis presented to [facility] with progressive generalized weakness and unintentional weight loss, and admitted for hyponatremia (Na 123), generalized weakness and failure to thrive. UA with nitrite, LE and many bacteria although he had no UTI symptoms.  He was started on IV ceftriaxone.  Reports about 30 pounds unintentional weight loss in about 6 months.  Had pan CT on 11/4 that was unrevealing except for thickening of distal esophagus for which she was referred to GI.  He is planned to have EGD in January 2022.  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  Of note, his weight from 11/2017 to 05/17/2020 stable at 59 kg although patient reports interval weight gain during that period.  Patient never had colonoscopy.  Hyponatremia improved with IV fluid.  Also started on p.o. NaCl tablets.  PT recommended outpatient PT. Urine culture with GNR.  He is on ceftriaxone pending culture sensitivity.   Subjective: Seen and examined earlier this morning.  No major events overnight or this morning.  Reports generalized weakness and urinary hesitancy but no dysuria or urgency.  Denies GI symptoms or new back pain.  Denies chest pain or dyspnea.  Objective: Vitals:   06/22/20 0500 06/22/20 1359 06/22/20 2158 06/23/20 0432  BP: 121/75 110/69 110/72 123/76  Pulse:  80 77 74  Resp: _0 Temp: 98.1 F (36.7 C) 98.2 F (36.8 C) 98.2 F (36.8 C) 97.8 F (36.6 C)  TempSrc: Oral Oral Oral Oral  SpO2: 100% 99% 98% 98%  Weight:      Height:        Intake/Output  Summary (Last 24 hours) at 06/23/2020 1456 Last data filed at 06/23/2020 1222 Gross per 24 hour  Intake 420 ml  Output 1700 ml  Net -1280 ml   Filed Weights   06/21/20 1647 06/21/20 2246  Weight: 55.3 kg 55.3 kg    Examination:  GENERAL: Frail looking elderly male.  Sitting on bedside chair.  No apparent distress. HEENT: MMM.  Vision and hearing grossly intact.  NECK: Supple.  No apparent JVD.  RESP:  No IWOB.  Fair aeration bilaterally. CVS:  RRR. Heart sounds normal.  ABD/GI/GU: BS+. Abd soft, NTND.  No suprapubic or CVA tenderness. MSK/EXT:  Moves extremities. No apparent deformity. No edema.  SKIN: no apparent skin lesion or wound NEURO: Awake, alert and oriented appropriately.  Motor 3+/5 with hip flexion, knee extension and ankle flexion in RLE.  3+/5 with hip flexion, 4/5 with knee extension and 0/5 with ankle flexion (dorsi and plantar) in LLE.  Was not able to elicit patellar and Achilles reflex.  Light sensation intact. PSYCH: Calm. Normal affect.   Procedures:  None  Microbiology summarized: [id number] and influenza PCR nonreactive. Urine culture pending.  Assessment & Plan: Hyponatremia: Na 136 on 10/4> 123 on admit>> 128>> 127> 130.  Next of hypovolemia and SIADH.  Responded to IVF.  Urine Na 88 (elevated).  TSH within normal.  Aldactone and Entresto on his med list but patient denies taking Aldactone.  He has no hyperkalemia either. -Continue IV NS at 75 cc  an hour -Add p.o. NaCl 1 g twice daily. -Check a.m. cortisol -Liberate diet to regular.  Generalized weakness/asymmetric lower extremity weakness with left foot drop: recent CT chest/abdomen/pelvis unrevealing except for distal esophageal thickness but patient denies esophageal dysphagia.  Had extensive work-up by neurologist on 11/11 that were unrevealing.  He was diagnosed with sensory motor neuropathy thought to be due to uncontrolled diabetes.  CRP and ESR negative.  Generalized weakness could be due to  hyponatremia and failure to thrive. -Continue PT/OT-recommended outpatient PT. -Treat treatable potential causes a liberal diet.  Possible esophagitis: CT revealed distal esophageal thickening concerning for esophagitis -Increased p.o. Protonix to 40 mg daily -Patient has upcoming appointment for EGD with Dr. Havery Moros next month.  Uncontrolled NIDDM-2 with hyperglycemia: A1c 7.7%.  He is on Metformin at home which could potentially contribute to weight loss Recent Labs  Lab 06/22/20 1154 06/22/20 1639 06/22/20 2153 06/23/20 0816 06/23/20 1203  GLUCAP 285* 164* 117* 117* 194*  -Continue SSI-moderate -Needs to be on a statin. -Liberated diet given failure to thrive.  Chronic systolic CHF: Unknown EF.  No TEE report in his EMR or Care Everywhere.  Now seems to be dehydrated.  He is not on Lasix at home.  Aldactone and Entresto listed on his chart but not taking Aldactone.  About 1.8 L UOP/24 hours without diuretics. -Continue home Entresto and Coreg -Monitor fluid status while on IV fluid  History of CAD: Stable.  No anginal symptoms. -Coreg and Entresto as above -Needs to be on statin  Possible UTI: UA with nitrites, large LE and many bacteria.  Now reports hesitancy.  No fever or leukocytosis.  Urine culture with GNR. -Continue IV ceftriaxone pending urine culture sensitivity  Hypomagnesemia: Resolved.  Hypokalemia/hypophosphatemia: K3.6.  P2.1. -IV potassium phosphate 20 mmol x 1.  Pulmonary nodules: CT chest on 05/17/2020 revealed apical pulmonary nodules with the largest one measuring 8 mm -Repeat CT in 3 to 6 months recommended.  Severe malnutrition/failure to thrive/unintentional weight loss-significant muscle mass and subcu fat loss.  Unclear etiology but uncontrolled diabetes and Metformin could contribute some.  His CHF seems to be compensated.  His recent CT suggestive for distal esophagitis and apical pulmonary nodules.  CRP and ESR within normal range. Body mass  index is 17.02 kg/m. Nutrition Problem: Severe Malnutrition Etiology: chronic illness (CHF, DM)  Wt Readings from Last 10 Encounters:  06/21/20 55.3 kg  05/24/20 55.8 kg  05/22/20 58.5 kg  05/17/20 59 kg  12/07/17 59 kg  -Liberated diet -Appreciate input by dietitian Signs/Symptoms: energy intake < or equal to 75% for > or equal to 1 month,severe fat depletion,severe muscle depletion,percent weight loss Percent weight loss: 5.8 % Interventions: Ensure Enlive (each supplement provides 350kcal and 20 grams of protein),MVI,Liberalize Diet   DVT prophylaxis:  enoxaparin (LOVENOX) injection 40 mg Start: 06/22/20 1000  Code Status: Full code Family Communication: Patient and/or RN. Available if any question.   Status is: Inpatient  Remains inpatient appropriate because:Persistent severe electrolyte disturbances, IV treatments appropriate due to intensity of illness or inability to take PO and Inpatient level of care appropriate due to severity of illness   Dispo: The patient is from: Home              Anticipated d/c is to: Home              Anticipated d/c date is: 1 day              Patient currently is not  medically stable to d/c.           Consultants:  None   Sch Meds:  Scheduled Meds:  carvedilol  6.25 mg Oral BID WC   enoxaparin (LOVENOX) injection  40 mg Subcutaneous Q24H   feeding supplement  237 mL Oral BID BM   insulin aspart  0-15 Units Subcutaneous TID AC & HS   multivitamin with minerals  1 tablet Oral Daily   pantoprazole  40 mg Oral Daily   sacubitril-valsartan  1 tablet Oral BID   sodium chloride  1 g Oral BID WC   vitamin B-1  250 mg Oral Daily   vitamin B-12  1,000 mcg Oral Daily   Continuous Infusions:  sodium chloride 50 mL/hr at 06/23/20 0813   cefTRIAXone (ROCEPHIN)  IV Stopped (06/23/20 0806)   potassium PHOSPHATE IVPB (in mmol) 20 mmol (06/23/20 1222)   PRN Meds:.acetaminophen **OR** acetaminophen, ondansetron **OR** ondansetron  (ZOFRAN) IV, polyethylene glycol  Antimicrobials: Anti-infectives (From admission, onward)    Start     Dose/Rate Route Frequency Ordered Stop   06/22/20 2000  cefTRIAXone (ROCEPHIN) 1 g in sodium chloride 0.9 % 100 mL IVPB        1 g 200 mL/hr over 30 Minutes Intravenous Every 24 hours 06/22/20 0459 06/28/20 1959   06/21/20 1930  cefTRIAXone (ROCEPHIN) 1 g in sodium chloride 0.9 % 100 mL IVPB        1 g 200 mL/hr over 30 Minutes Intravenous  Once 06/21/20 1918 06/21/20 2045        I have personally reviewed the following labs and images: CBC: Recent Labs  Lab 06/21/20 1658 06/22/20 0445 06/23/20 0344  WBC 8.5 10.6* 6.6  NEUTROABS  --  8.4*  --   HGB 13.7 13.3 12.6*  HCT 37.4* 36.2* 35.3*  MCV 92.8 92.3 93.1  PLT 235 201 206   BMP &GFR Recent Labs  Lab 06/21/20 1658 06/22/20 0251 06/22/20 0445 06/22/20 1008 06/23/20 0344  NA 123* 126* 128* 127* 130*  K 4.3 3.8 3.6 3.8 3.6  CL 92* 96* 97* 96* 101  CO2 _0 GLUCOSE 137* 129* 127* 143* 141*  BUN 25* _1 CREATININE 0.65 0.58* 0.62 0.59* 0.60*  CALCIUM 8.9 8.4* 8.5* 8.9 8.2*  MG  --   --  1.3*  --  1.9  PHOS  --   --   --   --  2.1*   Estimated Creatinine Clearance: 65.3 mL/min (A) (by C-G formula based on SCr of 0.6 mg/dL (L)). Liver & Pancreas: Recent Labs  Lab 06/21/20 1940 06/22/20 0445 06/23/20 0344  AST 24 20  --   ALT 26 23  --   ALKPHOS 50 46  --   BILITOT 0.8 1.0  --   PROT 6.7 5.8*  --   ALBUMIN 3.8 3.2* 3.0*   Recent Labs  Lab 06/21/20 1940  LIPASE 54*   Recent Labs  Lab 06/22/20 0251  AMMONIA 15   Diabetic: Recent Labs    06/22/20 0251  HGBA1C 7.7*   Recent Labs  Lab 06/22/20 1154 06/22/20 1639 06/22/20 2153 06/23/20 0816 06/23/20 1203  GLUCAP 285* 164* 117* 117* 194*   Cardiac Enzymes: Recent Labs  Lab 06/21/20 1940  CKTOTAL 122   No results for input(s): PROBNP in the last 8760 hours. Coagulation Profile: Recent Labs  Lab 06/21/20 1940   INR 1.1   Thyroid Function Tests: Recent Labs    06/22/20  0251  TSH 2.632   Lipid Profile: Recent Labs    06/22/20 0445  CHOL 130  HDL 49  LDLCALC 72  TRIG 44  CHOLHDL 2.7   Anemia Panel: Recent Labs    06/22/20 0251  VITAMINB12 1,199*  FOLATE 14.3   Urine analysis:    Component Value Date/Time   COLORURINE YELLOW 06/21/2020 1659   APPEARANCEUR CLOUDY (A) 06/21/2020 1659   LABSPEC 1.015 06/21/2020 1659   PHURINE 6.0 06/21/2020 1659   GLUCOSEU NEGATIVE 06/21/2020 1659   HGBUR NEGATIVE 06/21/2020 1659   BILIRUBINUR NEGATIVE 06/21/2020 1659   KETONESUR NEGATIVE 06/21/2020 1659   PROTEINUR NEGATIVE 06/21/2020 1659   NITRITE POSITIVE (A) 06/21/2020 1659   LEUKOCYTESUR MODERATE (A) 06/21/2020 1659   Sepsis Labs: Invalid input(s): PROCALCITONIN, [place]  Microbiology: Recent Results (from the past 240 hour(s))  Urine culture     Status: Abnormal (Preliminary result)   Collection Time: 06/21/20  4:59 PM   Specimen: Urine, Clean Catch  Result Value Ref Range Status   Specimen Description   Final    URINE, CLEAN CATCH Performed at [facility], [address] [place], [place] [address]    Special Requests   Final    NONE Performed at [facility], [address] [place], [place] [address]    Culture (A)  Final    >=100,000 COLONIES/mL GRAM NEGATIVE RODS SUSCEPTIBILITIES TO FOLLOW Performed at [facility], [place] [address], [place], [place] [address]    Report Status PENDING  Incomplete  Resp Panel by RT-PCR (Flu A&B, Covid) Nasopharyngeal Swab     Status: None   Collection Time: 06/21/20  7:40 PM   Specimen: Nasopharyngeal Swab; Nasopharyngeal(NP) swabs in vial transport medium  Result Value Ref Range Status   SARS Coronavirus 2 by RT PCR NEGATIVE NEGATIVE Final    Comment: (NOTE) SARS-CoV-2 target nucleic acids are NOT DETECTED.  The SARS-CoV-2 RNA is generally detectable in upper respiratory specimens during the acute  phase of infection. The lowest concentration of SARS-CoV-2 viral copies this assay can detect is 138 copies/mL. A negative result does not preclude SARS-Cov-2 infection and should not be used as the sole basis for treatment or other patient management decisions. A negative result may occur with  improper specimen collection/handling, submission of specimen other than nasopharyngeal swab, presence of viral mutation(s) within the areas targeted by this assay, and inadequate number of viral copies(<138 copies/mL). A negative result must be combined with clinical observations, patient history, and epidemiological information. The expected result is Negative.  Fact Sheet for Patients:  EntrepreneurPulse.com.au  Fact Sheet for Healthcare Providers:  IncredibleEmployment.be  This test is no t yet approved or cleared by the [place] FDA and  has been authorized for detection and/or diagnosis of SARS-CoV-2 by FDA under an Emergency Use Authorization (EUA). This EUA will remain  in effect (meaning this test can be used) for the duration of the COVID-19 declaration under Section 564(b)(1) of the Act, 21 U.S.C.section 360bbb-3(b)(1), unless the authorization is terminated  or revoked sooner.       Influenza A by PCR NEGATIVE NEGATIVE Final   Influenza B by PCR NEGATIVE NEGATIVE Final    Comment: (NOTE) The Xpert Xpress SARS-CoV-2/FLU/RSV plus assay is intended as an aid in the diagnosis of influenza from Nasopharyngeal swab specimens and should not be used as a sole basis for treatment. Nasal washings and aspirates are unacceptable for Xpert Xpress SARS-CoV-2/FLU/RSV testing.  Fact Sheet for  Patients: EntrepreneurPulse.com.au  Fact Sheet for Healthcare Providers: IncredibleEmployment.be  This test is not yet approved or cleared by the [place] FDA and has been authorized for detection and/or diagnosis of  SARS-CoV-2 by FDA under an Emergency Use Authorization (EUA). This EUA will remain in effect (meaning this test can be used) for the duration of the COVID-19 declaration under Section 564(b)(1) of the Act, 21 U.S.C. section 360bbb-3(b)(1), unless the authorization is terminated or revoked.  Performed at [facility], [address] [place], [place] [address]   Culture, blood (routine x 2)     Status: None (Preliminary result)   Collection Time: 06/21/20  7:56 PM   Specimen: BLOOD  Result Value Ref Range Status   Specimen Description   Final    BLOOD Blood Culture adequate volume Performed at [facility], [address] [place], [place] [address]    Special Requests   Final    BOTTLES DRAWN AEROBIC AND ANAEROBIC LEFT ANTECUBITAL Performed at [facility] [address] [place], [place] [address]    Culture   Final    NO GROWTH 1 DAY Performed at [facility], [place] [address], [place], [place] [address]    Report Status PENDING  Incomplete  Culture, blood (routine x 2)     Status: None (Preliminary result)   Collection Time: 06/21/20  8:06 PM   Specimen: BLOOD  Result Value Ref Range Status   Specimen Description   Final    BLOOD Blood Culture adequate volume Performed at [facility], [address] [place], [place] [address]    Special Requests   Final    BOTTLES DRAWN AEROBIC AND ANAEROBIC BLOOD LEFT FOREARM Performed at [facility], [address], [place], [place] [address]    Culture   Final    NO GROWTH 1 DAY Performed at [facility], [place] [address] [place], [place] [address]    Report Status PENDING  Incomplete    Radiology Studies: No results found.    Aaron Rush  If 7PM-7AM, please contact night-coverage [email or web address] 06/23/2020, 2:56 PM

## 2020-06-24 LAB — URINE CULTURE: Culture: >=100000 — AB

## 2020-06-24 LAB — GLUCOSE, CAPILLARY
Glucose-Capillary: 135 mg/dL — ABNORMAL HIGH (ref 70–99)
Glucose-Capillary: 136 mg/dL — ABNORMAL HIGH (ref 70–99)
Glucose-Capillary: 146 mg/dL — ABNORMAL HIGH (ref 70–99)
Glucose-Capillary: 171 mg/dL — ABNORMAL HIGH (ref 70–99)

## 2020-06-24 LAB — RENAL FUNCTION PANEL
Albumin: 2.9 g/dL — ABNORMAL LOW (ref 3.5–5.0)
Albumin: 2.9 g/dL — ABNORMAL LOW (ref 3.5–5.0)
Anion gap: 8 (ref 5–15)
Anion gap: 9 (ref 5–15)
BUN: 12 mg/dL (ref 8–23)
BUN: 11 mg/dL (ref 8–23)
CO2: 20 mmol/L — ABNORMAL LOW (ref 22–32)
CO2: 22 mmol/L (ref 22–32)
Calcium: 8.2 mg/dL — ABNORMAL LOW (ref 8.9–10.3)
Calcium: 8.3 mg/dL — ABNORMAL LOW (ref 8.9–10.3)
Chloride: 99 mmol/L (ref 98–111)
Chloride: 98 mmol/L (ref 98–111)
Creatinine, Ser: 0.54 mg/dL — ABNORMAL LOW (ref 0.61–1.24)
Creatinine, Ser: 0.64 mg/dL (ref 0.61–1.24)
GFR, Estimated: >60 mL/min (ref >60)
GFR, Estimated: >60 mL/min (ref >60)
Glucose, Bld: 153 mg/dL — ABNORMAL HIGH (ref 70–99)
Glucose, Bld: 202 mg/dL — ABNORMAL HIGH (ref 70–99)
Phosphorus: 2.1 mg/dL — ABNORMAL LOW (ref 2.5–4.6)
Phosphorus: 2.2 mg/dL — ABNORMAL LOW (ref 2.5–4.6)
Potassium: 3.8 mmol/L (ref 3.5–5.1)
Potassium: 3.4 mmol/L — ABNORMAL LOW (ref 3.5–5.1)
Sodium: 127 mmol/L — ABNORMAL LOW (ref 135–145)
Sodium: 129 mmol/L — ABNORMAL LOW (ref 135–145)

## 2020-06-24 LAB — HEMOGLOBIN AND HEMATOCRIT, BLOOD
HCT: 33.9 % — ABNORMAL LOW (ref 39.0–52.0)
Hemoglobin: 12.2 g/dL — ABNORMAL LOW (ref 13.0–17.0)

## 2020-06-24 LAB — MAGNESIUM: Magnesium: 1.7 mg/dL (ref 1.7–2.4)

## 2020-06-24 MED ORDER — SODIUM CHLORIDE 0.9 % IV SOLN: INTRAVENOUS | Status: AC

## 2020-06-24 MED ORDER — SODIUM CHLORIDE 1 G PO TABS
1.0000 g | ORAL_TABLET | Freq: Three times a day (TID) | ORAL | Status: DC
  Administered 2020-06-24 – 2020-06-25 (×3): 1 g via ORAL
  Filled 2020-06-24 (×5): qty 1

## 2020-06-24 MED ORDER — POTASSIUM PHOSPHATES 15 MMOLE/5ML IV SOLN
20.0000 mmol | Freq: Once | INTRAVENOUS | Status: AC
  Administered 2020-06-24: 20 mmol via INTRAVENOUS
  Filled 2020-06-24: qty 6.67

## 2020-06-24 MED ORDER — CEPHALEXIN 500 MG PO CAPS: 500.0000 mg | ORAL_CAPSULE | Freq: Four times a day (QID) | ORAL | Status: DC

## 2020-06-24 MED ORDER — CEPHALEXIN 500 MG PO CAPS
500.0000 mg | ORAL_CAPSULE | Freq: Four times a day (QID) | ORAL | Status: DC
  Administered 2020-06-24 – 2020-06-25 (×3): 500 mg via ORAL
  Filled 2020-06-24 (×3): qty 1

## 2020-06-24 MED ORDER — MAGNESIUM SULFATE 2 GM/50ML IV SOLN
2.0000 g | Freq: Once | INTRAVENOUS | Status: AC
  Administered 2020-06-24: 2 g via INTRAVENOUS
  Filled 2020-06-24: qty 50

## 2020-06-24 MED ORDER — POTASSIUM CHLORIDE CRYS ER 20 MEQ PO TBCR
40.0000 meq | EXTENDED_RELEASE_TABLET | Freq: Once | ORAL | Status: AC
  Administered 2020-06-24: 40 meq via ORAL
  Filled 2020-06-24: qty 2

## 2020-06-24 NOTE — Progress Notes (Addendum)
Physical Therapy Treatment Patient Details Name: Aaron Rush MRN: [id number] DOB: [birth year] Today's Date: 06/24/2020    History of Present Illness Pt is a 74 y.o. male admitted 06/21/20 with c/o progressive generalized weakness and unintentional weight loss; etiology still unknown. Workup revealed UTI. Recent CT imaging without malignancy. PMH includes uncontrolled DM2, diabetic polyneuropathy, CAD, CHF, stroke.    PT Comments    Patient progressing well towards PT goals. Reports feeling well today. Had just finished LE exercises from OPPT prior to PT arrival. Improved ambulation distance with Min guard assist and use of RW for support. Reports not feeling fatigued or tired today. Continues to have left foot drop but no instance of tripping noted. This still puts pt at risk for falls. Reviewed stair training technique. Recommend continuing OPPT. Will follow.   Follow Up Recommendations  Outpatient PT;Supervision - Intermittent (continue OPPT)     Equipment Recommendations  None recommended by PT    Recommendations for Other Services       Precautions / Restrictions Precautions Precautions: Fall;Other (comment) Precaution Comments: L foot drop Restrictions Weight Bearing Restrictions: No    Mobility  Bed Mobility               General bed mobility comments: Up in chair upon PT arrival.  Transfers Overall transfer level: Needs assistance Equipment used: Rolling walker (2 wheeled) Transfers: Sit to/from Stand Sit to Stand: Supervision         General transfer comment: Supervision for safety. Stood from chair x1, no difficulties.  Ambulation/Gait Ambulation/Gait assistance: Min guard Gait Distance (Feet): 175 Feet Assistive device: Rolling walker (2 wheeled) Gait Pattern/deviations: Step-through pattern;Decreased stride length;Decreased dorsiflexion - left;Step-to pattern;Trunk flexed   Gait velocity interpretation: 1.31 - 2.62 ft/sec, indicative of limited  community ambulator General Gait Details: Slow, mostly steady gait with step to gait initially progressing to step through gait; left foot drop but no tripping noted. Feels good today.   Stairs             Wheelchair Mobility    Modified Rankin (Stroke Patients Only)       Balance Overall balance assessment: Needs assistance Sitting-balance support: Feet supported;No upper extremity supported Sitting balance-Leahy Scale: Good     Standing balance support: During functional activity Standing balance-Leahy Scale: Poor Standing balance comment: Requires Ue support in standing.                            Cognition Arousal/Alertness: Awake/alert Behavior During Therapy: WFL for tasks assessed/performed Overall Cognitive Status: No family/caregiver present to determine baseline cognitive functioning                                 General Comments: Appears to have mrmory deficits per sister; reports to her that he did not have PT visit him as of yet. Also needs repetition at times, question HOH. Lives with 4 dogs and question some safety awareness? Above info gained atfer talking to sister on the phone.      Exercises      General Comments General comments (skin integrity, edema, etc.): Difficulty swallowing pills today;2 got stuck in throat and had to spit them back up. RN present and aware.      Pertinent Vitals/Pain Pain Assessment: No/denies pain    Home Living  Prior Function            PT Goals (current goals can now be found in the care plan section) Progress towards PT goals: Progressing toward goals    Frequency    Min 3X/week      PT Plan Current plan remains appropriate    Co-evaluation              AM-PAC PT "6 Clicks" Mobility   Outcome Measure  Help needed turning from your back to your side while in a flat bed without using bedrails?: None Help needed moving from lying on  your back to sitting on the side of a flat bed without using bedrails?: None Help needed moving to and from a bed to a chair (including a wheelchair)?: None Help needed standing up from a chair using your arms (e.g., wheelchair or bedside chair)?: None Help needed to walk in hospital room?: A Little Help needed climbing 3-5 steps with a railing? : A Little 6 Click Score: 22    End of Session Equipment Utilized During Treatment: Gait belt Activity Tolerance: Patient tolerated treatment well Patient left: in chair;with call bell/phone within reach;with chair alarm set Nurse Communication: Mobility status PT Visit Diagnosis: Other abnormalities of gait and mobility (R26.89);Muscle weakness (generalized) (M62.81);Repeated falls (R29.6)     Time: 7356-7014 PT Time Calculation (min) (ACUTE ONLY): 22 min  Charges:  $Gait Training: 8-22 mins                     Marisa Severin, PT, DPT Acute Rehabilitation Services Pager [phone] Office [place] 06/24/2020, 9:19 AM

## 2020-06-24 NOTE — Progress Notes (Signed)
PROGRESS NOTE  Aaron Rush WVP:[id number] DOB: Apr 20, 1946   PCP: Sandi Mariscal, MD  Patient is from: Home.  Lives alone.  Lately uses walker.  DOA: 06/21/2020 LOS: 2  Chief complaints: Generalized weakness and weight loss  Brief Narrative / Interim history: 73 year old male with history of systolic CHF, DM-2, CAD, CVA, HTN and possible esophagitis presented to [facility] with progressive generalized weakness and unintentional weight loss, and admitted for hyponatremia (Na 123), generalized weakness and failure to thrive. UA with nitrite, LE and many bacteria although he had no UTI symptoms.  He was started on IV ceftriaxone.  Reports about 30 pounds unintentional weight loss in about 6 months.  Had pan CT on 11/4 that was unrevealing except for thickening of distal esophagus for which she was referred to GI.  He is planned to have EGD in January 2022.  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  Of note, his weight from 11/2017 to 05/17/2020 stable at 59 kg although patient reports interval weight gain during that period.  Patient never had colonoscopy.  Hyponatremia initially improved with IV fluid but dropped again.  Urine culture with pansensitive E. coli.  He was transitioned to p.o. Keflex   Subjective: Seen and examined earlier this morning.  Feels better.  He walked with PT today.  No complaints.  Objective: Vitals:   06/23/20 0432 06/23/20 1634 06/23/20 2143 06/24/20 0401  BP: 123/76 123/73 118/76 (!) 148/86  Pulse: 74 83 79 81  Resp: 16 18 17 17   Temp: 97.8 F (36.6 C) 98 F (36.7 C) (!) 97.5 F (36.4 C) 97.8 F (36.6 C)  TempSrc: Oral     SpO2: 98% 99% 98% 99%  Weight:      Height:        Intake/Output Summary (Last 24 hours) at 06/24/2020 1341 Last data filed at 06/24/2020 0300 Gross per 24 hour  Intake 480 ml  Output 1025 ml  Net -545 ml   Filed Weights   06/21/20  1647 06/21/20 2246  Weight: 55.3 kg 55.3 kg    Examination:  GENERAL: No apparent distress.  Sitting on bedside chair. HEENT: MMM.  Vision and hearing grossly intact.  NECK: Supple.  No apparent JVD.  RESP: On RA.  No IWOB.  Fair aeration bilaterally. CVS:  RRR. Heart sounds normal.  ABD/GI/GU: BS+. Abd soft, NTND.  MSK/EXT:  Moves extremities. No apparent deformity. No edema.  SKIN: no apparent skin lesion or wound NEURO: Awake, alert and oriented appropriately. Motor 3+/5 with hip flexion and knee extension bilaterally and right ankle flexion. 0/5 with ankle flexion (dorsi and plantar) in LLE.  Patellar reflex diminished but symmetric.  Not able to elicit Achilles reflex bilaterally.  Light sensation intact. PSYCH: Calm. Normal affect.  Procedures:  None  Microbiology summarized: NIOEV-03 and influenza PCR nonreactive. Urine culture pending.  Assessment & Plan: Hyponatremia: Na 136 on 10/4> 123 on admit>> 128>> 127> 130> 127.  Next of hypovolemia and SIADH.  Responded to IVF.  Urine Na 88 (elevated).  TSH within normal.  Aldactone and Entresto on his med list but patient denies taking Aldactone.  He has no hyperkalemia either. -Continue IV NS at 75 cc an hour -Increase p.o. NaCl to 1 g 3 times daily. -Check a.m. cortisol -Liberated diet to regular. -Recheck renal panel in the afternoon-May consider Samsca if no improvement.  Generalized weakness/asymmetric lower extremity weakness with left foot drop: recent  CT chest/abdomen/pelvis unrevealing except for distal esophageal thickness but patient denies esophageal dysphagia.  Had extensive work-up by neurologist on 11/11 that were unrevealing.  He was diagnosed with sensory motor neuropathy thought to be due to uncontrolled diabetes.  CRP and ESR negative.  Generalized weakness could be due to hyponatremia and failure to thrive. -Continue PT/OT-recommended outpatient PT. -Treat treatable potential causes a liberal diet.  Possible  esophagitis: CT revealed distal esophageal thickening concerning for esophagitis -Continue increased p.o. Protonix 40 mg daily -Patient has upcoming appointment for EGD with Dr. Havery Moros next month.  Uncontrolled NIDDM-2 with hyperglycemia: A1c 7.7%.  He is on Metformin at home which could potentially contribute to weight loss Recent Labs  Lab 06/23/20 1203 06/23/20 1630 06/23/20 2147 06/24/20 0726 06/24/20 1141  GLUCAP 194* 195* 126* 135* 136*  -Continue SSI-moderate -Needs to be on a statin. -Liberated diet given failure to thrive.  Chronic systolic CHF: Unknown EF.  No TEE report in his EMR or Care Everywhere.  Now seems to be dehydrated.  He is not on Lasix at home.  Aldactone and Entresto listed on his chart but not taking Aldactone.  About 1.8 L UOP/24 hours without diuretics. -Continue home Entresto and Coreg -Monitor fluid status while on IV fluid  History of CAD: Stable.  No anginal symptoms. -Coreg and Entresto as above -Needs to be on statin  E. coli UTI:: UA with nitrites, large LE and many bacteria.  Hard hesitancy.  Urine culture with pansensitive E. coli.  -IV ceftriaxone>> p.o. Keflex 12/12>> 12/16  Hypokalemia/hypophosphatemia/hypomagnesemia: K3.8.  P2.1.  Mg 1.7.  Refeeding syndrome? -IV potassium phosphate 20 mmol x 1. -IV mag sulfate 2 g x 1.  Pulmonary nodules: CT chest on 05/17/2020 revealed apical pulmonary nodules with the largest one measuring 8 mm -Repeat CT in 3 to 6 months recommended.  Severe malnutrition/failure to thrive/unintentional weight loss-significant muscle mass and subcu fat loss.  Unclear etiology but uncontrolled diabetes and Metformin could contribute some.  His CHF seems to be compensated.  His recent CT suggestive for distal esophagitis and apical pulmonary nodules.  CRP and ESR within normal range. Body mass index is 17.02 kg/m. Nutrition Problem: Severe Malnutrition Etiology: chronic illness (CHF, DM)  Wt Readings from Last 10  Encounters:  06/21/20 55.3 kg  05/24/20 55.8 kg  05/22/20 58.5 kg  05/17/20 59 kg  12/07/17 59 kg  -Liberated diet -Appreciate input by dietitian Signs/Symptoms: energy intake < or equal to 75% for > or equal to 1 month,severe fat depletion,severe muscle depletion,percent weight loss Percent weight loss: 5.8 % Interventions: Ensure Enlive (each supplement provides 350kcal and 20 grams of protein),MVI,Liberalize Diet   DVT prophylaxis:  enoxaparin (LOVENOX) injection 40 mg Start: 06/22/20 1000  Code Status: Full code Family Communication: Patient and/or RN. Available if any question.   Status is: Inpatient  Remains inpatient appropriate because:Persistent severe electrolyte disturbances, IV treatments appropriate due to intensity of illness or inability to take PO and Inpatient level of care appropriate due to severity of illness   Dispo: The patient is from: Home              Anticipated d/c is to: Home              Anticipated d/c date is: 1 day              Patient currently is not medically stable to d/c.           Consultants:  None   Sch  Meds:  Scheduled Meds:  carvedilol  6.25 mg Oral BID WC   cephALEXin  500 mg Oral Q6H   enoxaparin (LOVENOX) injection  40 mg Subcutaneous Q24H   feeding supplement  237 mL Oral BID BM   insulin aspart  0-15 Units Subcutaneous TID AC & HS   multivitamin with minerals  1 tablet Oral Daily   pantoprazole  40 mg Oral Daily   sacubitril-valsartan  1 tablet Oral BID   sodium chloride  1 g Oral TID WC   vitamin B-1  250 mg Oral Daily   vitamin B-12  1,000 mcg Oral Daily   Continuous Infusions:  sodium chloride 75 mL/hr at 06/24/20 0949   PRN Meds:.acetaminophen **OR** acetaminophen, ondansetron **OR** ondansetron (ZOFRAN) IV, polyethylene glycol  Antimicrobials: Anti-infectives (From admission, onward)   Start     Dose/Rate Route Frequency Ordered Stop   06/24/20 1800  cephALEXin (KEFLEX) capsule 500 mg   Status:  Discontinued        500 mg Oral Every 6 hours 06/24/20 1247 06/24/20 1248   06/24/20 1800  cephALEXin (KEFLEX) capsule 500 mg        500 mg Oral Every 6 hours 06/24/20 1248 06/28/20 2359   06/22/20 2000  cefTRIAXone (ROCEPHIN) 1 g in sodium chloride 0.9 % 100 mL IVPB  Status:  Discontinued        1 g 200 mL/hr over 30 Minutes Intravenous Every 24 hours 06/22/20 0459 06/24/20 1247   06/21/20 1930  cefTRIAXone (ROCEPHIN) 1 g in sodium chloride 0.9 % 100 mL IVPB        1 g 200 mL/hr over 30 Minutes Intravenous  Once 06/21/20 1918 06/21/20 2045       I have personally reviewed the following labs and images: CBC: Recent Labs  Lab 06/21/20 1658 06/22/20 0445 06/23/20 0344 06/24/20 0410  WBC 8.5 10.6* 6.6  --   NEUTROABS  --  8.4*  --   --   HGB 13.7 13.3 12.6* 12.2*  HCT 37.4* 36.2* 35.3* 33.9*  MCV 92.8 92.3 93.1  --   PLT 235 201 206  --    BMP &GFR Recent Labs  Lab 06/22/20 0251 06/22/20 0445 06/22/20 1008 06/23/20 0344 06/24/20 0410  NA 126* 128* 127* 130* 127*  K 3.8 3.6 3.8 3.6 3.8  CL 96* 97* 96* 101 99  CO2 22 22 22 23  20*  GLUCOSE 129* 127* 143* 141* 153*  BUN 17 15 13 11 12   CREATININE 0.58* 0.62 0.59* 0.60* 0.54*  CALCIUM 8.4* 8.5* 8.9 8.2* 8.2*  MG  --  1.3*  --  1.9 1.7  PHOS  --   --   --  2.1* 2.1*   Estimated Creatinine Clearance: 65.3 mL/min (A) (by C-G formula based on SCr of 0.54 mg/dL (L)). Liver & Pancreas: Recent Labs  Lab 06/21/20 1940 06/22/20 0445 06/23/20 0344 06/24/20 0410  AST 24 20  --   --   ALT 26 23  --   --   ALKPHOS 50 46  --   --   BILITOT 0.8 1.0  --   --   PROT 6.7 5.8*  --   --   ALBUMIN 3.8 3.2* 3.0* 2.9*   Recent Labs  Lab 06/21/20 1940  LIPASE 54*   Recent Labs  Lab 06/22/20 0251  AMMONIA 15   Diabetic: Recent Labs    06/22/20 0251  HGBA1C 7.7*   Recent Labs  Lab 06/23/20 1203 06/23/20 1630 06/23/20 2147 06/24/20  0726 06/24/20 1141  GLUCAP 194* 195* 126* 135* 136*   Cardiac  Enzymes: Recent Labs  Lab 06/21/20 1940  CKTOTAL 122   No results for input(s): PROBNP in the last 8760 hours. Coagulation Profile: Recent Labs  Lab 06/21/20 1940  INR 1.1   Thyroid Function Tests: Recent Labs    06/22/20 0251  TSH 2.632   Lipid Profile: Recent Labs    06/22/20 0445  CHOL 130  HDL 49  LDLCALC 72  TRIG 44  CHOLHDL 2.7   Anemia Panel: Recent Labs    06/22/20 0251  VITAMINB12 1,199*  FOLATE 14.3   Urine analysis:    Component Value Date/Time   COLORURINE YELLOW 06/21/2020 1659   APPEARANCEUR CLOUDY (A) 06/21/2020 1659   LABSPEC 1.015 06/21/2020 1659   PHURINE 6.0 06/21/2020 1659   GLUCOSEU NEGATIVE 06/21/2020 1659   HGBUR NEGATIVE 06/21/2020 1659   BILIRUBINUR NEGATIVE 06/21/2020 1659   KETONESUR NEGATIVE 06/21/2020 1659   PROTEINUR NEGATIVE 06/21/2020 1659   NITRITE POSITIVE (A) 06/21/2020 1659   LEUKOCYTESUR MODERATE (A) 06/21/2020 1659   Sepsis Labs: Invalid input(s): PROCALCITONIN, [place]  Microbiology: Recent Results (from the past 240 hour(s))  Urine culture     Status: Abnormal   Collection Time: 06/21/20  4:59 PM   Specimen: Urine, Clean Catch  Result Value Ref Range Status   Specimen Description   Final    URINE, CLEAN CATCH Performed at [facility], [address] [place], [place] [address]    Special Requests   Final    NONE Performed at [facility], [address] [place], [place] [address]    Culture >=100,000 COLONIES/mL ESCHERICHIA COLI (A)  Final   Report Status 06/24/2020 FINAL  Final   Organism ID, Bacteria ESCHERICHIA COLI (A)  Final      Susceptibility   Escherichia coli - MIC*    AMPICILLIN <=2 SENSITIVE Sensitive     CEFAZOLIN <=4 SENSITIVE Sensitive     CEFEPIME <=0.12 SENSITIVE Sensitive     CEFTRIAXONE <=0.25 SENSITIVE Sensitive     CIPROFLOXACIN <=0.25 SENSITIVE Sensitive     GENTAMICIN <=1 SENSITIVE Sensitive     IMIPENEM <=0.25 SENSITIVE Sensitive     NITROFURANTOIN  <=16 SENSITIVE Sensitive     TRIMETH/SULFA <=20 SENSITIVE Sensitive     AMPICILLIN/SULBACTAM <=2 SENSITIVE Sensitive     PIP/TAZO <=4 SENSITIVE Sensitive     * >=100,000 COLONIES/mL ESCHERICHIA COLI  Resp Panel by RT-PCR (Flu A&B, Covid) Nasopharyngeal Swab     Status: None   Collection Time: 06/21/20  7:40 PM   Specimen: Nasopharyngeal Swab; Nasopharyngeal(NP) swabs in vial transport medium  Result Value Ref Range Status   SARS Coronavirus 2 by RT PCR NEGATIVE NEGATIVE Final    Comment: (NOTE) SARS-CoV-2 target nucleic acids are NOT DETECTED.  The SARS-CoV-2 RNA is generally detectable in upper respiratory specimens during the acute phase of infection. The lowest concentration of SARS-CoV-2 viral copies this assay can detect is 138 copies/mL. A negative result does not preclude SARS-Cov-2 infection and should not be used as the sole basis for treatment or other patient management decisions. A negative result may occur with  improper specimen collection/handling, submission of specimen other than nasopharyngeal swab, presence of viral mutation(s) within the areas targeted by this assay, and inadequate number of viral copies(<138 copies/mL). A negative result must be combined with clinical observations, patient history, and epidemiological information. The expected result is Negative.  Fact Sheet for Patients:  EntrepreneurPulse.com.au  Fact Sheet for Healthcare Providers:  IncredibleEmployment.be  This test is no t yet approved or cleared by the [place] FDA and  has been authorized for detection and/or diagnosis of SARS-CoV-2 by FDA under an Emergency Use Authorization (EUA). This EUA will remain  in effect (meaning this test can be used) for the duration of the COVID-19 declaration under Section 564(b)(1) of the Act, 21 U.S.C.section 360bbb-3(b)(1), unless the authorization is terminated  or revoked sooner.       Influenza A by PCR  NEGATIVE NEGATIVE Final   Influenza B by PCR NEGATIVE NEGATIVE Final    Comment: (NOTE) The Xpert Xpress SARS-CoV-2/FLU/RSV plus assay is intended as an aid in the diagnosis of influenza from Nasopharyngeal swab specimens and should not be used as a sole basis for treatment. Nasal washings and aspirates are unacceptable for Xpert Xpress SARS-CoV-2/FLU/RSV testing.  Fact Sheet for Patients: EntrepreneurPulse.com.au  Fact Sheet for Healthcare Providers: IncredibleEmployment.be  This test is not yet approved or cleared by the [place] FDA and has been authorized for detection and/or diagnosis of SARS-CoV-2 by FDA under an Emergency Use Authorization (EUA). This EUA will remain in effect (meaning this test can be used) for the duration of the COVID-19 declaration under Section 564(b)(1) of the Act, 21 U.S.C. section 360bbb-3(b)(1), unless the authorization is terminated or revoked.  Performed at [facility], [address] [place], [place] [address]   Culture, blood (routine x 2)     Status: None (Preliminary result)   Collection Time: 06/21/20  7:56 PM   Specimen: BLOOD  Result Value Ref Range Status   Specimen Description   Final    BLOOD Blood Culture adequate volume Performed at [facility], [address] [place], [place] [address]    Special Requests   Final    BOTTLES DRAWN AEROBIC AND ANAEROBIC LEFT ANTECUBITAL Performed at [facility], [address] [place], [place] [address]    Culture   Final    NO GROWTH 2 DAYS Performed at [facility], [place] [address] [place], [place] [address]    Report Status PENDING  Incomplete  Culture, blood (routine x 2)     Status: None (Preliminary result)   Collection Time: 06/21/20  8:06 PM   Specimen: BLOOD  Result Value Ref Range Status   Specimen Description   Final    BLOOD Blood Culture adequate volume Performed at [facility], [address] [place], [place] [address]    Special Requests   Final    BOTTLES DRAWN AEROBIC AND ANAEROBIC BLOOD LEFT FOREARM Performed at [facility], [address] [place], [place] [address]    Culture   Final    NO GROWTH 2 DAYS Performed at [facility], [place] [address], [place], [place] [address]    Report Status PENDING  Incomplete    Radiology Studies: No results found.    Aaron Rush T. [place]  If 7PM-7AM, please contact night-coverage [email or web address] 06/24/2020, 1:41 PM

## 2020-06-25 ENCOUNTER — Other Ambulatory Visit (HOSPITAL_COMMUNITY): Payer: Self-pay | Admitting: Student

## 2020-06-25 DIAGNOSIS — R5381 Other malaise: Secondary | ICD-10-CM

## 2020-06-25 DIAGNOSIS — I2510 Atherosclerotic heart disease of native coronary artery without angina pectoris: Secondary | ICD-10-CM

## 2020-06-25 LAB — RENAL FUNCTION PANEL
Albumin: 3.1 g/dL — ABNORMAL LOW (ref 3.5–5.0)
Anion gap: 8 (ref 5–15)
BUN: 10 mg/dL (ref 8–23)
CO2: 23 mmol/L (ref 22–32)
Calcium: 8.5 mg/dL — ABNORMAL LOW (ref 8.9–10.3)
Chloride: 100 mmol/L (ref 98–111)
Creatinine, Ser: 0.63 mg/dL (ref 0.61–1.24)
GFR, Estimated: >60 mL/min (ref >60)
Glucose, Bld: 142 mg/dL — ABNORMAL HIGH (ref 70–99)
Phosphorus: 2.7 mg/dL (ref 2.5–4.6)
Potassium: 4.6 mmol/L (ref 3.5–5.1)
Sodium: 131 mmol/L — ABNORMAL LOW (ref 135–145)

## 2020-06-25 LAB — CBC
HCT: 36.1 % — ABNORMAL LOW (ref 39.0–52.0)
Hemoglobin: 13.4 g/dL (ref 13.0–17.0)
MCH: 34.7 pg — ABNORMAL HIGH (ref 26.0–34.0)
MCHC: 37.1 g/dL — ABNORMAL HIGH (ref 30.0–36.0)
MCV: 93.5 fL (ref 80.0–100.0)
Platelets: 231 10*3/uL (ref 150–400)
RBC: 3.86 MIL/uL — ABNORMAL LOW (ref 4.22–5.81)
RDW: 12.5 % (ref 11.5–15.5)
WBC: 6.1 10*3/uL (ref 4.0–10.5)
nRBC: 0 % (ref 0.0–0.2)

## 2020-06-25 LAB — MAGNESIUM: Magnesium: 1.8 mg/dL (ref 1.7–2.4)

## 2020-06-25 LAB — CORTISOL-AM, BLOOD: Cortisol - AM: 18 ug/dL (ref 6.7–22.6)

## 2020-06-25 LAB — GLUCOSE, CAPILLARY
Glucose-Capillary: 143 mg/dL — ABNORMAL HIGH (ref 70–99)
Glucose-Capillary: 162 mg/dL — ABNORMAL HIGH (ref 70–99)

## 2020-06-25 MED ORDER — PANTOPRAZOLE SODIUM 40 MG PO TBEC: 40.0000 mg | DELAYED_RELEASE_TABLET | Freq: Every day | ORAL | 0 refills | Status: DC

## 2020-06-25 MED ORDER — ADULT MULTIVITAMIN W/MINERALS CH: 1.0000 | ORAL_TABLET | Freq: Every day | ORAL | 1 refills | Status: AC

## 2020-06-25 MED ORDER — SODIUM CHLORIDE 1 G PO TABS: 1.0000 g | ORAL_TABLET | Freq: Three times a day (TID) | ORAL | 0 refills | Status: DC

## 2020-06-25 MED ORDER — ENSURE ENLIVE PO LIQD: 237.0000 mL | Freq: Two times a day (BID) | ORAL | 0 refills | Status: AC

## 2020-06-25 MED ORDER — CEPHALEXIN 500 MG PO CAPS: 500.0000 mg | ORAL_CAPSULE | Freq: Four times a day (QID) | ORAL | 0 refills | Status: DC

## 2020-06-25 MED FILL — SODIUM CHLORIDE 1 GM TABS: 1 | 7 days supply | Qty: 21 | Fill #0

## 2020-06-25 MED FILL — PANTOPRAZOLE SOD DR 40 MG T: 40 | 30 days supply | Qty: 30 | Fill #0

## 2020-06-25 MED FILL — CEPHALEXIN 500 MG CAPS: 500 | 3 days supply | Qty: 12 | Fill #0

## 2020-06-25 NOTE — Discharge Summary (Signed)
Physician Discharge Summary  Aaron Rush IDP:[id number] DOB: 25-Aug-1945 DOA: 06/21/2020  PCP: Aaron Mariscal, MD  Admit date: 06/21/2020 Discharge date: 06/25/2020  Admitted From: Home Disposition: Home  Recommendations for Outpatient Follow-up:  1. Follow ups as below. 2. Please obtain CBC/BMP/Mag at follow up 3. Please follow up on the following pending results: None  Home Health: Patient to continue outpatient rehab Equipment/Devices: Patient has appropriate DME  Discharge Condition: Stable CODE STATUS: Full code   Follow-up Information    Aaron Mariscal, MD. Schedule an appointment as soon as possible for a visit in 1 week(s).   Specialty: Internal Medicine Contact information: [place] [address] [phone]                Hospital Course: 74 year old male with history of systolic CHF, DM-2, CAD, CVA, HTN, left foot drop, sensorimotor polyneuropathy, and possible esophagitis presented to [facility] with progressive generalized weakness and unintentional weight loss, and admitted for hyponatremia (Na 123), generalized weakness and failure to thrive. UA with nitrite, LE and many bacteria.  He had some hesitancy with urination. He was started on IV ceftriaxone.  Patient reports about 30 pounds unintentional weight loss in about 6 months.  Had pan CT on 11/4 that was unrevealing except for thickening of distal esophagus for which she was referred to GI.  He is planned to have EGD in January 2022.  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  Of note, his weight from 11/2017 to 05/17/2020 stable at 59 kg although patient reports interval weight gain during that period.  Patient never had colonoscopy.  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  His urine sodium was elevated to 88.  TSH and a.m. cortisol was normal.  Hyponatremia improved to 131 with IV  normal saline and p.o. sodium chloride.  He is discharged on p.o. sodium chloride 1 g 3 times daily until he follows up with his PCP in 1 week.  Also recommended liberating his sodium intake.  In regards to UTI, Urine culture with pansensitive E. coli.  He was transitioned to p.o. Keflex, and and discharged on p.o. Keflex.   In regards to his weight loss, poor p.o. intake and failure to thrive, increase his Protonix to 40 mg daily given concern for esophagitis on his recent CT. also started on a short LES and multivitamin.  Patient has upcoming appointment with gastroenterologist for further evaluation.  Patient was evaluated by therapy who recommended outpatient PT/OT.  He was encouraged to locate to ALF/LTC as he does not qualify for SNF.  Patient and patient's sister trended down home health.  See individual problem list below for more hospital course.  Discharge Diagnoses:  Hyponatremia: Na 136 on 10/4> 123 on admit>>>131.  Mix of hypovolemia and SIADH.   Urine Na 88 (elevated).  TSH within normal. Responded to IVF and p.o. sodium chloride.  Entresto could contribute some. -Discharged on p.o. NaCl 1 g 3 times daily for 7 days. -Liberated diet to regular. -Recheck in 1 week.  Generalized weakness/asymmetric lower extremity weakness with left foot drop: recent CT chest/abdomen/pelvis unrevealing except for distal esophageal thickness but patient denies esophageal dysphagia.  Had extensive work-up by neurologist on 11/11 that were unrevealing.  He was diagnosed with sensory motor neuropathy thought to be due to uncontrolled diabetes.  CRP and ESR negative.  Generalized weakness could be due to hyponatremia and failure to thrive. -  Outpatient PT/OT  Possible esophagitis: CT revealed distal esophageal thickening concerning for esophagitis -Continue increased p.o. Protonix 40 mg daily -Patient has upcoming appointment for EGD with Dr. Havery Rush next month.  Uncontrolled NIDDM-2 with  hyperglycemia: A1c 7.7%.  He is on Metformin at home which could potentially contribute to weight loss -Discharged on home Metformin for now.  Patient cannot manage insulin at home -Liberated diet given failure to thrive.  Chronic systolic CHF: Unknown EF.  No TEE report in his EMR or Care Everywhere.  He is not on Lasix at home.  He was dehydrated on admission.  Rehydrated. -Continue home Entresto and Coreg  History of CAD: Stable.  No anginal symptoms. -Coreg and Entresto as above  E. coli UTI: Urine culture with pansensitive E. coli.  -IV ceftriaxone>> p.o. Keflex 12/12>>12/16  Hypokalemia/hypophosphatemia/hypomagnesemia: Refeeding syndrome?  Resolved.  Pulmonary nodules: CT chest on 05/17/2020 revealed apical pulmonary nodules with the largest one measuring 8 mm -Repeat CT in 3 to 6 months recommended.  Severe malnutrition/failure to thrive/unintentional weight loss-significant muscle mass and subcu fat loss. Unclear etiology but uncontrolled diabetes and Metformin could contribute some.  His CHF seems to be compensated.  His recent CT suggestive for distal esophagitis and apical pulmonary nodules.  CRP and ESR within normal range.  Body mass index is 17.02 kg/m. Nutrition Problem: Severe Malnutrition Etiology: chronic illness (CHF, DM) Signs/Symptoms: energy intake < or equal to 75% for > or equal to 1 month,severe fat depletion,severe muscle depletion,percent weight loss Percent weight loss: 5.8 % Interventions: Ensure Enlive (each supplement provides 350kcal and 20 grams of protein),MVI,Liberalize Diet      Discharge Exam: Vitals:   06/24/20 2000 06/25/20 0437  BP: 113/68 131/85  Pulse: 79 79  Resp: 17 16  Temp: 97.6 F (36.4 C) 97.8 F (36.6 C)  SpO2: 100% 100%    GENERAL: No apparent distress.  Sitting on bedside chair. HEENT: MMM.  Poor dentition.  Vision and hearing grossly intact.  NECK: Supple.  No apparent JVD.  RESP:  No IWOB.  Fair aeration  bilaterally. CVS:  RRR. Heart sounds normal.  ABD/GI/GU: Bowel sounds present. Soft. Non tender.  MSK/EXT:  Moves extremities.  Significant muscle mass and subcu fat loss. SKIN: no apparent skin lesion or wound NEURO: Awake, alert and oriented appropriately.  Motor 3+/5 with hip flexion and knee extension bilaterally and right ankle flexion. 0/5 with ankle flexion (dorsi and plantar) in LLE.  Patellar reflex diminished but symmetric.  Not able to elicit  PSYCH: Calm. Normal affect.  Discharge Instructions  Discharge Instructions    (HEART FAILURE PATIENTS) Call MD:  Anytime you have any of the following symptoms: 1) 3 pound weight gain in 24 hours or 5 pounds in 1 week 2) shortness of breath, with or without a dry hacking cough 3) swelling in the hands, feet or stomach 4) if you have to sleep on extra pillows at night in order to breathe.   Complete by: As directed    Amb Referral to Nutrition and Diabetic E   Complete by: As directed    Call MD for:  difficulty breathing, headache or visual disturbances   Complete by: As directed    Call MD for:  extreme fatigue   Complete by: As directed    Call MD for:  persistant dizziness or light-headedness   Complete by: As directed    Call MD for:  persistant nausea and vomiting   Complete by: As directed    Call MD  for:  temperature >100.4   Complete by: As directed    Diet general   Complete by: As directed    Discharge instructions   Complete by: As directed    It has been a pleasure taking care of you!  You were hospitalized and treated for generalized weakness, unintentional weight loss, urinary tract infection and hyponatremia (low sodium level).  We are discharging you on more medications to complete treatment course.  It is very important that you take your medications as prescribed.  Please review your new medication list and the directions on your medications before you take them.  Please follow-up with your primary care doctor,  cardiologist and gastroenterologist in 1 to 2 weeks or sooner if needed.   Take care,   Increase activity slowly   Complete by: As directed      Allergies as of 06/25/2020   No Known Allergies     Medication List    TAKE these medications   carvedilol 6.25 MG tablet Commonly known as: COREG Take 6.25 mg by mouth 2 (two) times daily.   cephALEXin 500 MG capsule Commonly known as: KEFLEX Take 1 capsule (500 mg total) by mouth every 6 (six) hours.   [employer] Calcium 600 MG tablet Generic drug: calcium carbonate Take 1 tablet by mouth 2 (two) times daily.   Entresto 24-26 MG Generic drug: sacubitril-valsartan Take 1 tablet by mouth 2 (two) times daily.   feeding supplement Liqd Take 237 mLs by mouth 2 (two) times daily between meals.   metFORMIN 1000 MG tablet Commonly known as: GLUCOPHAGE Take 500 mg by mouth 2 (two) times daily.   multivitamin with minerals Tabs tablet Take 1 tablet by mouth daily.   pantoprazole 40 MG tablet Commonly known as: PROTONIX Take 1 tablet (40 mg total) by mouth daily. What changed:   medication strength  how much to take   sodium chloride 1 g tablet Take 1 tablet (1 g total) by mouth 3 (three) times daily with meals.   vitamin B-1 250 MG tablet Take 250 mg by mouth daily.   vitamin B-12 1000 MCG tablet Commonly known as: CYANOCOBALAMIN Take 1,000 mcg by mouth daily.   vitamin C 1000 MG tablet Take 500 mg by mouth daily.       Consultations:  None  Procedures/Studies:   NCV with EMG(electromyography)  Result Date: 06/15/2020 Alda Berthold, DO     06/15/2020  5:06 PM [facility] [place], [place]  [place], [place] [address] Tel: [phone] Fax:  [phone] Test Date:  06/15/2020 Patient: Aaron Rush DOB: 01/18/1946 Physician: Narda Amber, DO Sex: Male Height: _0  Ref Phys: Narda Amber, DO ID#: [id number]   Technician:  Patient Complaints: This is a 74 year old man referred for evaluation of  bilateral leg weakness, worse on the left leg there is foot drop. NCV & EMG Findings: Extensive electrodiagnostic testing of the left upper extremity and additional studies of the right shows: 1. Bilateral superficial peroneal and left sural sensory responses are absent.  Right sural sensory response shows normal amplitude. 2. Bilateral peroneal motor responses at the extensor digitorum brevis are absent.  Peroneal motor response of the left tibialis anterior is barely and on the left, normal on the right.  Bilateral tibial motor responses show reduced amplitude. 3. Bilateral tibial H reflex studies are absent. 4. Chronic motor axonal loss changes are seen affecting nearly all the tested muscles in the lower extremities, which is worse on the left  where there is evidence of active denervation in all of the muscles along with the lumbar paraspinal muscles. 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  Overall, these findings are severe in degree electrically. ___________________________ Narda Amber, DO Nerve Conduction Studies Anti Sensory Summary Table  Stim Site NR Peak (ms) Norm Peak (ms) P-T Amp (V) Norm P-T Amp Left Sup Peroneal Anti Sensory (Ant Lat Mall)  34C 12 cm NR  <4.6  >3 Right Sup Peroneal Anti Sensory (Ant Lat Mall)  34C 12 cm NR  <4.6  >3 Left Sural Anti Sensory (Lat Mall)  34C Calf NR  <4.6  >3 Right Sural Anti Sensory (Lat Mall)  34C Calf    3.3 <4.6 4.7 >3 Motor Summary Table  Stim Site NR Onset (ms) Norm Onset (ms) O-P Amp (mV) Norm O-P Amp Site1 Site2 Delta-0 (ms) Dist (cm) Vel (m/s) Norm Vel (m/s) Left Peroneal Motor (Ext Dig Brev)  34C Ankle NR  <6.0  >2.5 B Fib Ankle  0.0  >40 B Fib NR     Poplt B Fib  0.0  >40 Poplt    20.7  1.0        Right Peroneal Motor (Ext Dig Brev)  34C Ankle NR  <6.0  >2.5 B Fib Ankle  0.0  >40 B Fib NR     Poplt B Fib  0.0  >40 Poplt NR            Left Peroneal TA Motor (Tib Ant)  34C Fib Head    3.0 <4.5 0.7 >3 Poplit Fib Head 5.8 8.0 14 >40 Poplit    8.8  0.7        Right Peroneal TA Motor (Tib Ant)  34C Fib Head    4.0 <4.5 3.5 >3 Poplit Fib Head 2.0 8.0 40 >40 Poplit    6.0  3.0        Left Tibial Motor (Abd Hall Brev)  34C Ankle    6.0 <6.0 1.6 >4 Knee Ankle 9.3 38.0 41 >40 Knee    15.3  1.2        Right Tibial Motor (Abd Hall Brev)  34C Ankle    4.1 <6.0 2.5 >4 Knee Ankle 11.8 39.0 33 >40 Knee    15.9  2.0        H Reflex Studies  NR H-Lat (ms) Lat Norm (ms) L-R H-Lat (ms) Left Tibial (Gastroc)  34C NR  <35  Right Tibial (Gastroc)  34C NR  <35  EMG  Side Muscle Ins Act Fibs Psw Fasc Number Recrt Dur Dur. Amp Amp. Poly Poly. Comment Right AntTibialis Nml Nml Nml Nml 1- Rapid Some 1+ Some 1+ Nml Nml N/A Right Gastroc Nml Nml Nml Nml 1- Rapid Some 1+ Some 1+ Nml Nml N/A Right Flex Dig Long Nml Nml Nml Nml 2- Rapid Many 1+ Many 1+ Many Nml N/A Right RectFemoris Nml Nml Nml Nml 1- Rapid Some 1+ Some 1+ Nml Nml N/A Right GluteusMed _0  _1  Nml Nml N/A Left Lumbo Parasp Low Nml 1+ _2  _3  N/A Right BicepsFemS _4  _5  Nml Nml N/A Left AntTibialis Nml 1+ Nml Nml SMU Rapid All 1+ All 1+ Nml Nml N/A Left Gastroc Nml 1+ Nml Nml 2- Rapid Many 1+ Many 1+ Nml Nml N/A Left GluteusMed Nml 1+ Nml  Nml 2- Rapid Most 1+ Many 1+ Nml Nml N/A Left BicepsFemS Nml 1+ Nml Nml 2- Rapid Most 1+ Most 1+ Most 1+ N/A Left RectFemoris Nml 1+ Nml Nml SMU Rapid All 1+ All 1+ Nml Nml N/A Waveforms:               DG FL GUIDED LUMBAR PUNCTURE  Result Date: 05/31/2020 CLINICAL DATA:  Neuropathy.  Left foot drop. EXAM: DIAGNOSTIC LUMBAR PUNCTURE UNDER FLUOROSCOPIC GUIDANCE FLUOROSCOPY TIME:  Fluoroscopy Time: 0 minutes 13 seconds. 5.16 micro gray meter squared PROCEDURE: Informed consent was obtained from the patient prior to the procedure, including potential complications of headache,  allergy, and pain. With the patient prone, the lower back was prepped with Betadine. 1% Lidocaine was used for local anesthesia. Lumbar puncture was performed at the left L3-4 level using a 20 gauge needle with return of clear CSF with an opening pressure of 13 cm water. Seventeen ml of CSF were obtained for laboratory studies. The patient tolerated the procedure well and there were no apparent complications. IMPRESSION: Lumbar puncture on the left at L3-4. Normal opening pressure. Samples sent for requested studies. Electronically Signed   By: Nelson Chimes M.D.   On: 05/31/2020 12:21        The results of significant diagnostics from this hospitalization (including imaging, microbiology, ancillary and laboratory) are listed below for reference.     Microbiology: Recent Results (from the past 240 hour(s))  Urine culture     Status: Abnormal   Collection Time: 06/21/20  4:59 PM   Specimen: Urine, Clean Catch  Result Value Ref Range Status   Specimen Description   Final    URINE, CLEAN CATCH Performed at [facility], [address] [place], [place] [address]    Special Requests   Final    NONE Performed at [facility], [address] [place], [place] [address]    Culture >=100,000 COLONIES/mL ESCHERICHIA COLI (A)  Final   Report Status 06/24/2020 FINAL  Final   Organism ID, Bacteria ESCHERICHIA COLI (A)  Final      Susceptibility   Escherichia coli - MIC*    AMPICILLIN <=2 SENSITIVE Sensitive     CEFAZOLIN <=4 SENSITIVE Sensitive     CEFEPIME <=0.12 SENSITIVE Sensitive     CEFTRIAXONE <=0.25 SENSITIVE Sensitive     CIPROFLOXACIN <=0.25 SENSITIVE Sensitive     GENTAMICIN <=1 SENSITIVE Sensitive     IMIPENEM <=0.25 SENSITIVE Sensitive     NITROFURANTOIN <=16 SENSITIVE Sensitive     TRIMETH/SULFA <=20 SENSITIVE Sensitive     AMPICILLIN/SULBACTAM <=2 SENSITIVE Sensitive     PIP/TAZO <=4 SENSITIVE Sensitive     * >=100,000 COLONIES/mL ESCHERICHIA COLI  Resp  Panel by RT-PCR (Flu A&B, Covid) Nasopharyngeal Swab     Status: None   Collection Time: 06/21/20  7:40 PM   Specimen: Nasopharyngeal Swab; Nasopharyngeal(NP) swabs in vial transport medium  Result Value Ref Range Status   SARS Coronavirus 2 by RT PCR NEGATIVE NEGATIVE Final    Comment: (NOTE) SARS-CoV-2 target nucleic acids are NOT DETECTED.  The SARS-CoV-2 RNA is generally detectable in upper respiratory specimens during the acute phase of infection. The lowest concentration of SARS-CoV-2 viral copies this assay can detect is 138 copies/mL. A negative result does not preclude SARS-Cov-2 infection and should not be used as the sole basis for treatment or other patient management decisions. A negative result may occur with  improper specimen collection/handling, submission of specimen  other than nasopharyngeal swab, presence of viral mutation(s) within the areas targeted by this assay, and inadequate number of viral copies(<138 copies/mL). A negative result must be combined with clinical observations, patient history, and epidemiological information. The expected result is Negative.  Fact Sheet for Patients:  EntrepreneurPulse.com.au  Fact Sheet for Healthcare Providers:  IncredibleEmployment.be  This test is no t yet approved or cleared by the [place] FDA and  has been authorized for detection and/or diagnosis of SARS-CoV-2 by FDA under an Emergency Use Authorization (EUA). This EUA will remain  in effect (meaning this test can be used) for the duration of the COVID-19 declaration under Section 564(b)(1) of the Act, 21 U.S.C.section 360bbb-3(b)(1), unless the authorization is terminated  or revoked sooner.       Influenza A by PCR NEGATIVE NEGATIVE Final   Influenza B by PCR NEGATIVE NEGATIVE Final    Comment: (NOTE) The Xpert Xpress SARS-CoV-2/FLU/RSV plus assay is intended as an aid in the diagnosis of influenza from Nasopharyngeal  swab specimens and should not be used as a sole basis for treatment. Nasal washings and aspirates are unacceptable for Xpert Xpress SARS-CoV-2/FLU/RSV testing.  Fact Sheet for Patients: EntrepreneurPulse.com.au  Fact Sheet for Healthcare Providers: IncredibleEmployment.be  This test is not yet approved or cleared by the [place] FDA and has been authorized for detection and/or diagnosis of SARS-CoV-2 by FDA under an Emergency Use Authorization (EUA). This EUA will remain in effect (meaning this test can be used) for the duration of the COVID-19 declaration under Section 564(b)(1) of the Act, 21 U.S.C. section 360bbb-3(b)(1), unless the authorization is terminated or revoked.  Performed at [facility], [address] [place], [place] [address]   Culture, blood (routine x 2)     Status: None (Preliminary result)   Collection Time: 06/21/20  7:56 PM   Specimen: BLOOD  Result Value Ref Range Status   Specimen Description   Final    BLOOD Blood Culture adequate volume Performed at [facility], [address] [place], [place] [address]    Special Requests   Final    BOTTLES DRAWN AEROBIC AND ANAEROBIC LEFT ANTECUBITAL Performed at [facility], [address] [place], [place] [address]    Culture   Final    NO GROWTH 2 DAYS Performed at [facility], [place] [address], [place], [place] [address]    Report Status PENDING  Incomplete  Culture, blood (routine x 2)     Status: None (Preliminary result)   Collection Time: 06/21/20  8:06 PM   Specimen: BLOOD  Result Value Ref Range Status   Specimen Description   Final    BLOOD Blood Culture adequate volume Performed at [facility], [address] [place], [place] [address]    Special Requests   Final    BOTTLES DRAWN AEROBIC AND ANAEROBIC BLOOD LEFT FOREARM Performed at [facility], [address], [place], [place]  [address]    Culture   Final    NO GROWTH 2 DAYS Performed at [facility], [place] [address] [place], [place] [address]    Report Status PENDING  Incomplete     Labs: BNP (last 3 results) No results for input(s): BNP in the last 8760 hours. Basic Metabolic Panel: Recent Labs  Lab 06/22/20 0445 06/22/20 1008 06/23/20 0344 06/24/20 0410 06/24/20 1525 06/25/20 0353  NA 128* 127* 130* 127* 129* 131*  K 3.6 3.8 3.6 3.8 3.4* 4.6  CL 97* 96* 101 99 98 100  CO2 _0 20* 22 23  GLUCOSE 127* 143* 141* 153* 202* 142*  BUN _1 CREATININE 0.62 0.59* 0.60* 0.54* 0.64 0.63  CALCIUM 8.5* 8.9 8.2* 8.2* 8.3* 8.5*  MG 1.3*  --  1.9 1.7  --   --   PHOS  --   --  2.1* 2.1* 2.2* 2.7   Liver Function Tests: Recent Labs  Lab 06/21/20 1940 06/22/20 0445 06/23/20 0344 06/24/20 0410 06/24/20 1525 06/25/20 0353  AST 24 20  --   --   --   --   ALT 26 23  --   --   --   --   ALKPHOS 50 46  --   --   --   --   BILITOT 0.8 1.0  --   --   --   --   PROT 6.7 5.8*  --   --   --   --   ALBUMIN 3.8 3.2* 3.0* 2.9* 2.9* 3.1*   Recent Labs  Lab 06/21/20 1940  LIPASE 54*   Recent Labs  Lab 06/22/20 0251  AMMONIA 15   CBC: Recent Labs  Lab 06/21/20 1658 06/22/20 0445 06/23/20 0344 06/24/20 0410  WBC 8.5 10.6* 6.6  --   NEUTROABS  --  8.4*  --   --   HGB 13.7 13.3 12.6* 12.2*  HCT 37.4* 36.2* 35.3* 33.9*  MCV 92.8 92.3 93.1  --   PLT 235 201 206  --    Cardiac Enzymes: Recent Labs  Lab 06/21/20 1940  CKTOTAL 122   BNP: Invalid input(s): POCBNP CBG: Recent Labs  Lab 06/23/20 2147 06/24/20 0726 06/24/20 1141 06/24/20 1741 06/24/20 2240  GLUCAP 126* 135* 136* 146* 171*   D-Dimer No results for input(s): DDIMER in the last 72 hours. Hgb A1c No results for input(s): HGBA1C in the last 72 hours. Lipid Profile No results for input(s): CHOL, HDL, LDLCALC, TRIG, CHOLHDL, LDLDIRECT in the last 72 hours. Thyroid function studies No results for input(s):  TSH, T4TOTAL, T3FREE, THYROIDAB in the last 72 hours.  Invalid input(s): FREET3 Anemia work up No results for input(s): VITAMINB12, FOLATE, FERRITIN, TIBC, IRON, RETICCTPCT in the last 72 hours. Urinalysis    Component Value Date/Time   COLORURINE YELLOW 06/21/2020 1659   APPEARANCEUR CLOUDY (A) 06/21/2020 1659   LABSPEC 1.015 06/21/2020 1659   PHURINE 6.0 06/21/2020 1659   GLUCOSEU NEGATIVE 06/21/2020 1659   HGBUR NEGATIVE 06/21/2020 1659   BILIRUBINUR NEGATIVE 06/21/2020 1659   KETONESUR NEGATIVE 06/21/2020 1659   PROTEINUR NEGATIVE 06/21/2020 1659   NITRITE POSITIVE (A) 06/21/2020 1659   LEUKOCYTESUR MODERATE (A) 06/21/2020 1659   Sepsis Labs Invalid input(s): PROCALCITONIN,  WBC,  LACTICIDVEN   Time coordinating discharge: 45 minutes  SIGNED:  Mercy Riding, MD  [facility] 06/25/2020, 7:48 AM  If 7PM-7AM, please contact night-coverage [email or web address]

## 2020-06-25 NOTE — Progress Notes (Signed)
Physical Therapy Treatment Patient Details Name: Aaron Rush MRN: [id number] DOB: Nov 22, 1945 Today's Date: 06/25/2020    History of Present Illness Pt is a 74 y.o. male admitted 06/21/20 with c/o progressive generalized weakness and unintentional weight loss; etiology still unknown. Workup revealed UTI. Recent CT imaging without malignancy. PMH includes uncontrolled DM2, diabetic polyneuropathy, CAD, CHF, stroke.    PT Comments    Pt ambulatory in hallway today, requiring no physical assist for any aspect of mobility today. Pt scored 14/24 on DGI, which demonstrates pt is at risk of falls. PT explained fall risk to pt, and pt states he has made much improvement in fall risk with OPPT efforts. PT encouraged pt to take his time at home and use RW for mobility to prevent falls, pt agreeable.   This PT spoke with pt's sister Aaron Rush via telephone today. Pt's sister expressed concern about pt's safety with mobility at home, as well as the fall risk associated with owning 4 dogs. Pt's sister interested in ST-SNF placement, but PT informed pt's sister that from a PT mobility standpoint, pt does not qualify for SNF. PT spoke with pt and CM about possibility of [place] services, including aide. Per pt's sister, they would like to continue with OPPT as opposed to [place]. PT encouraged family and pt to discuss long term care options if safety at home continues to be a concern. PT to continue to follow acutely.     Follow Up Recommendations  Outpatient PT;Supervision - Intermittent (continue OPPT)     Equipment Recommendations  None recommended by PT    Recommendations for Other Services       Precautions / Restrictions Precautions Precautions: Fall;Other (comment) Precaution Comments: foot drop, neuropathy Restrictions Weight Bearing Restrictions: No    Mobility  Bed Mobility               General bed mobility comments: Up in chair upon PT arrival.  Transfers Overall transfer  level: Needs assistance Equipment used: Rolling walker (2 wheeled) Transfers: Sit to/from Stand Sit to Stand: Supervision         General transfer comment: for safety, increased time to rise but no physical assist needed  Ambulation/Gait Ambulation/Gait assistance: Supervision Gait Distance (Feet): 160 Feet Assistive device: Rolling walker (2 wheeled) Gait Pattern/deviations: Step-through pattern;Decreased stride length;Decreased dorsiflexion - left;Step-to pattern;Trunk flexed;Decreased dorsiflexion - right;Steppage Gait velocity: decr   General Gait Details: supervision for safety, closer guard when PT challenging pt balance. slow and mostly steady gait, PT cuing increased foot clearance via hip and knee flexion when pt fatigued.   Stairs             Wheelchair Mobility    Modified Rankin (Stroke Patients Only)       Balance Overall balance assessment: Needs assistance Sitting-balance support: Feet supported;No upper extremity supported Sitting balance-Leahy Scale: Good Sitting balance - Comments: Able to sit edge of chair without back support   Standing balance support: During functional activity Standing balance-Leahy Scale: Poor Standing balance comment: reliant on external support in standing                 Standardized Balance Assessment Standardized Balance Assessment : Dynamic Gait Index   Dynamic Gait Index Level Surface: Mild Impairment Change in Gait Speed: Moderate Impairment Gait with Horizontal Head Turns: Mild Impairment Gait with Vertical Head Turns: Mild Impairment Gait and Pivot Turn: Mild Impairment Step Over Obstacle: Mild Impairment Step Around Obstacles: Mild Impairment Steps: Moderate Impairment (inferred) Total Score:  14      Cognition Arousal/Alertness: Awake/alert Behavior During Therapy: WFL for tasks assessed/performed Overall Cognitive Status: No family/caregiver present to determine baseline cognitive functioning                                  General Comments: Pt pleasant and appropriate during session, states he has no trouble managing dogs and meals at home. Per sister via telephone, pt does have difficulty feeding dogs, getting in and out of the house at times. Pt's sister is concerned about pt safety during mobility and ADLs at home      Exercises      General Comments        Pertinent Vitals/Pain Pain Assessment: No/denies pain    Home Living                      Prior Function            PT Goals (current goals can now be found in the care plan section) Acute Rehab PT Goals Patient Stated Goal: Return home and continue working on strength/balance with OP PT PT Goal Formulation: With patient Time For Goal Achievement: 07/06/20 Potential to Achieve Goals: Good Progress towards PT goals: Progressing toward goals    Frequency    Min 3X/week      PT Plan Current plan remains appropriate    Co-evaluation              AM-PAC PT "6 Clicks" Mobility   Outcome Measure  Help needed turning from your back to your side while in a flat bed without using bedrails?: None Help needed moving from lying on your back to sitting on the side of a flat bed without using bedrails?: None Help needed moving to and from a bed to a chair (including a wheelchair)?: None Help needed standing up from a chair using your arms (e.g., wheelchair or bedside chair)?: None Help needed to walk in hospital room?: A Little Help needed climbing 3-5 steps with a railing? : A Little 6 Click Score: 22    End of Session   Activity Tolerance: Patient tolerated treatment well Patient left: in chair;with call bell/phone within reach;Other (comment) (no batteries in chair alarm, NT notified pt needs new batteries) Nurse Communication: Mobility status PT Visit Diagnosis: Other abnormalities of gait and mobility (R26.89);Muscle weakness (generalized) (M62.81);Repeated falls (R29.6)      Time: 1050-1107 PT Time Calculation (min) (ACUTE ONLY): 17 min  Charges:  $Gait Training: 8-22 mins                    Aaron Rush, PT Acute Rehabilitation Services Pager [phone]  Office [phone]   Aaron Rush 06/25/2020, 12:35 PM

## 2020-06-25 NOTE — Evaluation (Signed)
Occupational Therapy Evaluation Patient Details Name: Aaron Rush MRN: [id number] DOB: Feb 23, 1946 Today's Date: 06/25/2020    History of Present Illness Pt is a 74 y.o. male admitted 06/21/20 with c/o progressive generalized weakness and unintentional weight loss; etiology still unknown. Workup revealed UTI. Recent CT imaging without malignancy. PMH includes uncontrolled DM2, diabetic polyneuropathy, CAD, CHF, stroke.   Clinical Impression   Pt is at Mod I - Sup level for safety with ADLs and ADL mobility using RW.Marland Kitchen Per PT note,  pt's sister and pt  would like to continue with OPPT as opposed to [place]. PT encouraged family and pt to discuss long term care options of ALF/LTC if safety at home continues to be a concern.  All education is completed and no further acute OT services are indicated at this time    Follow Up Recommendations  No follow up OT (Per PT note, pt's sister wants to resume OP PT vs HH therapies as he does not qualify for ST SNF. Recommed looking into ALF/LTC options in the future due to safety concerns)    Equipment Recommendations  None recommended by OT    Recommendations for Other Services       Precautions / Restrictions Precautions Precautions: Fall;Other (comment) Precaution Comments: foot drop, neuropathy Restrictions Weight Bearing Restrictions: No      Mobility Bed Mobility               General bed mobility comments: Up in chair upon OT arrival.    Transfers Overall transfer level: Needs assistance Equipment used: Rolling walker (2 wheeled) Transfers: Sit to/from Stand Sit to Stand: Supervision         General transfer comment: for safety, increased time to rise but no physical assist needed    Balance Overall balance assessment: Needs assistance Sitting-balance support: Feet supported;No upper extremity supported Sitting balance-Leahy Scale: Good Sitting balance - Comments: Able to sit edge of chair without back support    Standing balance support: During functional activity Standing balance-Leahy Scale: Poor Standing balance comment: reliant on external support in standing                 Standardized Balance Assessment Standardized Balance Assessment : Dynamic Gait Index   Dynamic Gait Index Level Surface: Mild Impairment Change in Gait Speed: Moderate Impairment Gait with Horizontal Head Turns: Mild Impairment Gait with Vertical Head Turns: Mild Impairment Gait and Pivot Turn: Mild Impairment Step Over Obstacle: Mild Impairment Step Around Obstacles: Mild Impairment Steps: Moderate Impairment (inferred) Total Score: 14     ADL either performed or assessed with clinical judgement   ADL Overall ADL's : Needs assistance/impaired Eating/Feeding: Independent   Grooming: Wash/dry hands;Wash/dry face;Standing;Supervision/safety   Upper Body Bathing: Modified independent   Lower Body Bathing: Supervison/ safety;Sit to/from stand Lower Body Bathing Details (indicate cue type and reason): Sup for safety Upper Body Dressing : Modified independent   Lower Body Dressing: Supervision/safety;Sit to/from stand Lower Body Dressing Details (indicate cue type and reason): sup for safety Toilet Transfer: Supervision/safety;Ambulation;RW   Toileting- Clothing Manipulation and Hygiene: Supervision/safety;Sit to/from stand       Functional mobility during ADLs: Supervision/safety       Vision Patient Visual Report: No change from baseline       Perception     Praxis      Pertinent Vitals/Pain Pain Assessment: No/denies pain     Hand Dominance Right   Extremity/Trunk Assessment             Communication  Communication Communication: No difficulties   Cognition Arousal/Alertness: Awake/alert Behavior During Therapy: WFL for tasks assessed/performed Overall Cognitive Status: No family/caregiver present to determine baseline cognitive functioning                                  General Comments: Pt pleasant and appropriate during session, states he has no trouble managing dogs and meals at home. Per sister via telephone, pt does have difficulty feeding dogs, getting in and out of the house at times. Pt's sister is concerned about pt safety during mobility and ADLs at home   General Comments       Exercises     Shoulder Instructions      Home Living Family/patient expects to be discharged to:: Private residence Living Arrangements: Alone Available Help at Discharge: Family;Friend(s);Available PRN/intermittently Type of Home: House Home Access: Stairs to enter [employer] of Steps: 3 Entrance Stairs-Rails: Right;Left Home Layout: One level     Bathroom Shower/Tub: Teacher, early years/pre: Standard     Home Equipment: Environmental consultant - 2 wheels;Shower seat;Grab bars - tub/shower;Grab bars - toilet   Additional Comments: Supportive sister and multiple supportive friends (who are currently caring for pt's four dogs)      Prior Functioning/Environment Level of Independence: Needs assistance  Gait / Transfers Assistance Needed: Prior to weakness onset past 3 months, 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 Has had falls ADL's / Homemaking Assistance Needed: Pt reports standing for showers or sitting on shower seat, mod indep with ADLs            OT Problem List: Decreased activity tolerance      OT Treatment/Interventions:      OT Goals(Current goals can be found in the care plan section) Acute Rehab OT Goals Patient Stated Goal: Return home and continue working on strength/balance with OP PT  OT Frequency:     Barriers to D/C:            Co-evaluation              AM-PAC OT "6 Clicks" Daily Activity     Outcome Measure Help from another person eating  meals?: None Help from another person taking care of personal grooming?: None Help from another person toileting, which includes using toliet, bedpan, or urinal?: None Help from another person bathing (including washing, rinsing, drying)?: A Little Help from another person to put on and taking off regular upper body clothing?: None Help from another person to put on and taking off regular lower body clothing?: A Little 6 Click Score: 22   End of Session Equipment Utilized During Treatment: Rolling walker  Activity Tolerance: Patient tolerated treatment well Patient left: in chair  OT Visit Diagnosis: Other abnormalities of gait and mobility (R26.89);History of falling (Z91.81);Muscle weakness (generalized) (M62.81)                Time: 0932-6712 OT Time Calculation (min): 12 min Charges:  OT General Charges $OT Visit: 1 Visit OT Evaluation $OT Eval Moderate Complexity: 1 Mod    Britt Bottom 06/25/2020, 1:35 PM

## 2020-06-25 NOTE — TOC Initial Note (Addendum)
Transition of Care [facility]) - Initial/Assessment Note    Patient Details  Name: Aaron Rush MRN: [id number] Date of Birth: 03-24-1946  [facility] CM/SW Contact:    Marilu Favre, RN Phone Number: 06/25/2020, 10:34 AM  Clinical Narrative:                 Discussed discharge plan with patient. Patient prefers to go home, however asked NCM to call his sister [name] [phone]. NCM called [name]. [name] has concerns with patient discharged to home.   NCM asked patient if PT recommended SNF if he would be agreeable to go. Patient in agreement.   NCM secured chatted MD, and PT assigned to patient today.    Will await PT eval. If SNF recommended , NCM will complete FL2 and submit to insurance for authorization. Patient aware.  [name] would like a call back after PT works with patient. Bedside nurse aware of all of above.   [place] to Alexa with PT, recommendation continues to be OP PT. Alexa spoke to patient's sister [name]. NCM called  his sister [name] [phone], back and discussed same. [name] wanting to know what time to pick up patient. NCM messaged bedside nurse to call [name] when patient ready   Expected Discharge Plan: [place]     Patient Goals and CMS Choice     Choice offered to / list presented to : [place]  Expected Discharge Plan and Services Expected Discharge Plan: [place] arrangements for the past 2 months: Single Family Home Expected Discharge Date: 06/25/20                                    Prior Living Arrangements/Services Living arrangements for the past 2 months: Single Family Home Lives with:: Self Patient language and need for interpreter reviewed:: Yes              Criminal Activity/Legal Involvement Pertinent to Current Situation/Hospitalization: No - Comment as needed  Activities of Daily Living Home Assistive  Devices/Equipment: Environmental consultant (specify type) ADL Screening (condition at time of admission) Patient's cognitive ability adequate to safely complete daily activities?: Yes Is the patient deaf or have difficulty hearing?: No Does the patient have difficulty seeing, even when wearing glasses/contacts?: No Does the patient have difficulty concentrating, remembering, or making decisions?: No Patient able to express need for assistance with ADLs?: Yes Does the patient have difficulty dressing or bathing?: No Independently performs ADLs?: No Communication: Independent Dressing (OT): Independent Grooming: Independent Feeding: Independent Bathing: Independent Toileting: Independent In/Out Bed: Independent Walks in Home: Independent Does the patient have difficulty walking or climbing stairs?: Yes Weakness of Legs: Both Weakness of Arms/Hands: None  Permission Sought/Granted   Permission granted to share information with : Yes, Verbal Permission Granted  Share Information with NAME: [name] sister [phone]           Emotional Assessment   Attitude/Demeanor/Rapport: Engaged Affect (typically observed): Accepting Orientation: : Oriented to Self,Oriented to Place,Oriented to  Time,Oriented to Situation Alcohol / Substance Use: Not Applicable Psych Involvement: No (comment)  Admission diagnosis:  Cachexia ([place]) [R64] Hyponatremia [E87.1] Acute cystitis with hematuria [N30.01] Generalized weakness [R53.1] 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 Complicated UTI (urinary tract infection) 06/22/2020  . Generalized weakness 06/22/2020  . Weight  loss, unintentional 06/22/2020  . Uncontrolled type 2 diabetes mellitus with diabetic polyneuropathy, without long-term current use of insulin ([place]) 06/22/2020  . Chronic congestive heart failure ([place]) 06/22/2020  . Coronary artery disease 06/22/2020  . Protein-calorie  malnutrition, severe 06/22/2020  . Hyponatremia 06/21/2020   PCP:  Sandi Mariscal, MD Pharmacy:   [id number] - [place], [address] [address] Phone: [phone] Fax: [phone]  [name] [place], [place] - [address] [place] [address] Phone: [phone] Fax: [phone]     Social Determinants of Health (SDOH) Interventions    Readmission Risk Interventions No flowsheet data found.

## 2020-06-26 DIAGNOSIS — E119 Type 2 diabetes mellitus without complications: Secondary | ICD-10-CM | POA: Diagnosis not present

## 2020-06-26 DIAGNOSIS — M6258 Muscle wasting and atrophy, not elsewhere classified, other site: Secondary | ICD-10-CM | POA: Diagnosis not present

## 2020-06-26 DIAGNOSIS — I5021 Acute systolic (congestive) heart failure: Secondary | ICD-10-CM | POA: Diagnosis not present

## 2020-06-26 DIAGNOSIS — E7800 Pure hypercholesterolemia, unspecified: Secondary | ICD-10-CM | POA: Diagnosis not present

## 2020-06-27 DIAGNOSIS — M21372 Foot drop, left foot: Secondary | ICD-10-CM | POA: Diagnosis not present

## 2020-06-27 LAB — CULTURE, BLOOD (ROUTINE X 2)
Culture: NO GROWTH
Culture: NO GROWTH
Specimen Description: ADEQUATE
Specimen Description: ADEQUATE

## 2020-06-29 DIAGNOSIS — M21372 Foot drop, left foot: Secondary | ICD-10-CM | POA: Diagnosis not present

## 2020-07-02 DIAGNOSIS — I429 Cardiomyopathy, unspecified: Secondary | ICD-10-CM | POA: Diagnosis not present

## 2020-07-02 DIAGNOSIS — R609 Edema, unspecified: Secondary | ICD-10-CM | POA: Diagnosis not present

## 2020-07-02 DIAGNOSIS — J90 Pleural effusion, not elsewhere classified: Secondary | ICD-10-CM | POA: Diagnosis not present

## 2020-07-02 DIAGNOSIS — R29898 Other symptoms and signs involving the musculoskeletal system: Secondary | ICD-10-CM | POA: Diagnosis not present

## 2020-07-03 DIAGNOSIS — R627 Adult failure to thrive: Secondary | ICD-10-CM | POA: Diagnosis not present

## 2020-07-03 DIAGNOSIS — R634 Abnormal weight loss: Secondary | ICD-10-CM | POA: Diagnosis not present

## 2020-07-03 DIAGNOSIS — K209 Esophagitis, unspecified without bleeding: Secondary | ICD-10-CM | POA: Diagnosis not present

## 2020-07-03 DIAGNOSIS — M21372 Foot drop, left foot: Secondary | ICD-10-CM | POA: Diagnosis not present

## 2020-07-03 DIAGNOSIS — E1142 Type 2 diabetes mellitus with diabetic polyneuropathy: Secondary | ICD-10-CM | POA: Diagnosis not present

## 2020-07-03 DIAGNOSIS — I2583 Coronary atherosclerosis due to lipid rich plaque: Secondary | ICD-10-CM | POA: Diagnosis not present

## 2020-07-03 DIAGNOSIS — I2510 Atherosclerotic heart disease of native coronary artery without angina pectoris: Secondary | ICD-10-CM | POA: Diagnosis not present

## 2020-07-03 DIAGNOSIS — E860 Dehydration: Secondary | ICD-10-CM | POA: Diagnosis not present

## 2020-07-03 DIAGNOSIS — Z23 Encounter for immunization: Secondary | ICD-10-CM | POA: Diagnosis not present

## 2020-07-03 DIAGNOSIS — I509 Heart failure, unspecified: Secondary | ICD-10-CM | POA: Diagnosis not present

## 2020-07-03 DIAGNOSIS — E119 Type 2 diabetes mellitus without complications: Secondary | ICD-10-CM | POA: Diagnosis not present

## 2020-07-04 ENCOUNTER — Encounter: Payer: Medicare HMO | Admitting: Neurology

## 2020-07-05 DIAGNOSIS — M21372 Foot drop, left foot: Secondary | ICD-10-CM | POA: Diagnosis not present

## 2020-07-09 DIAGNOSIS — M21372 Foot drop, left foot: Secondary | ICD-10-CM | POA: Diagnosis not present

## 2020-07-12 DIAGNOSIS — M21372 Foot drop, left foot: Secondary | ICD-10-CM | POA: Diagnosis not present

## 2020-07-18 DIAGNOSIS — H5203 Hypermetropia, bilateral: Secondary | ICD-10-CM | POA: Diagnosis not present

## 2020-07-18 DIAGNOSIS — H524 Presbyopia: Secondary | ICD-10-CM | POA: Diagnosis not present

## 2020-07-18 DIAGNOSIS — E119 Type 2 diabetes mellitus without complications: Secondary | ICD-10-CM | POA: Diagnosis not present

## 2020-07-18 DIAGNOSIS — H52223 Regular astigmatism, bilateral: Secondary | ICD-10-CM | POA: Diagnosis not present

## 2020-07-20 DIAGNOSIS — M21372 Foot drop, left foot: Secondary | ICD-10-CM | POA: Diagnosis not present

## 2020-07-23 ENCOUNTER — Encounter: Payer: Self-pay | Admitting: Gastroenterology

## 2020-07-23 ENCOUNTER — Ambulatory Visit: Payer: Medicare HMO | Admitting: Gastroenterology

## 2020-07-23 ENCOUNTER — Other Ambulatory Visit (INDEPENDENT_AMBULATORY_CARE_PROVIDER_SITE_OTHER): Payer: Medicare HMO

## 2020-07-23 VITALS — BP 116/64 | HR 62 | Ht 71.0 in | Wt 119.0 lb

## 2020-07-23 DIAGNOSIS — R933 Abnormal findings on diagnostic imaging of other parts of digestive tract: Secondary | ICD-10-CM

## 2020-07-23 DIAGNOSIS — E1149 Type 2 diabetes mellitus with other diabetic neurological complication: Secondary | ICD-10-CM

## 2020-07-23 DIAGNOSIS — E871 Hypo-osmolality and hyponatremia: Secondary | ICD-10-CM

## 2020-07-23 DIAGNOSIS — R634 Abnormal weight loss: Secondary | ICD-10-CM | POA: Diagnosis not present

## 2020-07-23 LAB — BASIC METABOLIC PANEL
BUN: 19 mg/dL (ref 6–23)
CO2: 28 mEq/L (ref 19–32)
Calcium: 9.5 mg/dL (ref 8.4–10.5)
Chloride: 101 mEq/L (ref 96–112)
Creatinine, Ser: 0.79 mg/dL (ref 0.40–1.50)
GFR: 88.55 mL/min (ref >60.00)
Glucose, Bld: 132 mg/dL — ABNORMAL HIGH (ref 70–99)
Potassium: 3.8 mEq/L (ref 3.5–5.1)
Sodium: 135 mEq/L (ref 135–145)

## 2020-07-23 NOTE — Patient Instructions (Addendum)
If you are age 73 or older, your body mass index should be between 23-30. Your Body mass index is 16.6 kg/m. If this is out of the aforementioned range listed, please consider follow up with your Primary Care Provider.  If you are age 44 or younger, your body mass index should be between 19-25. Your Body mass index is 16.6 kg/m. If this is out of the aformentioned range listed, please consider follow up with your Primary Care Provider.   You have been scheduled for an endoscopy. Please follow written instructions given to you at your visit today. If you use inhalers (even only as needed), please bring them with you on the day of your procedure.   You have been scheduled for an MRCP at [facility] on Wed 08-15-20 at 1:00pm. Please arrive 30 minutes prior to your appointment time for registration purposes. Please make certain not to have anything to eat or drink 6 hours prior to your test. In addition, if you have any metal in your body, have a pacemaker or defibrillator, please be sure to let your ordering physician know. This test typically takes 45 minutes to 1 hour to complete. Should you need to reschedule, please call [phone] to do so  . Please go to the lab in the basement of our building to have lab work done as you leave today. Hit "B" for basement when you get on the elevator.  When the doors open the lab is on your left.  We will call you with the results. Thank you.  Due to recent changes in healthcare laws, you may see the results of your imaging and laboratory studies on MyChart before your provider has had a chance to review them.  We understand that in some cases there may be results that are confusing or concerning to you. Not all laboratory results come back in the same time frame and the provider may be waiting for multiple results in order to interpret others.  Please give [name] 48 hours in order for your provider to thoroughly review all the results before contacting the  office for clarification of your results.    Thank you for entrusting me with your care and for choosing [facility], [address] [place]

## 2020-07-23 NOTE — Progress Notes (Signed)
HPI :  75 year old male here for a follow up visit for weight loss, abnormal imaging of the GI tract.   I saw him previously for these issues. Recall that he went to the ED in early November due to progressive lower extremity weakness and looking to get a Neurology evaluation. During that time he endorsed 30 lbs of weight loss, poor appetite. He had extensive imaging at that visit in the ER to include CT scan of the head, CT scan of the chest abdomen pelvis, CTA of the neck with and without contrast.  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  He had very mild dilation of the main pericarotid duct to 4 to 5 mm in diameter.  No pancreatic mass lesions.  Some apical pulmonary nodules, small angiomyolipoma of the left kidney. He was empirically placed on Protonix 20 mg a day but denied any reflux symptoms that bothered him.  He follows chronically with cardiology, he has a history of CAD and CHF, on Entresto.   At the last visit his progressive weakness and inability to walk was most pressing and we held off on endoscopic evaluation while he underwent urgent neurology workup and we also obtained his cardiology records in regards to last Echo  Echo July 2021 - EF 35-40%, Aortic valve mild sclerosis without significant gradient. Trace TR. Trace AR  Since have last seen him he was evaluated by neurology and had extensive work-up.  Ultimately he was diagnosed with new onset diabetes, hemoglobin A1c of 9.6.  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  He is started therapy for his diabetes and his blood sugars are much better controlled.  He has been going to physical therapy and is ambulating with a walker, he is not yet walking on his own.  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  He was discharged on salt tablets 3 times a day.  He was also treated for UTI during that hospitalization.  Since his admission to the hospital he states he is actually been doing a lot better.  He is now able to eat 3 meals per day which he has not been able to do in some time.  He states his weight loss has improved, he is overall still down about 30 pounds over the past 6 months, but progressive loss has stopped.  Overall appetite is coming back he has taste back in his mouth.  He denies any abdominal pains at all.  He has no trouble with his bowels.  No blood in the stools or diarrhea or constipation.  He denies any dysphagia.  No reflux symptoms really not bother him much.  He denies any cardiopulmonary symptoms.  No chest pains.  He is tolerating his Protonix.  He is now interested in pursuing endoscopy that we have previously discussed about as well as MRCP we discussed that he has never had a prior colonoscopy and is due for this however not sure if he is yet stable for bowel prep in light of his hyponatremia.  Prior workup: CT abdomen / pelvis 05/17/20 - IMPRESSION: 1. Large hiatal hernia. Mild circumferential distal esophageal thickening, may represent esophagitis. 2. Potential fat containing lesion in the LEFT kidney may represent a small angiomyolipoma. Consider follow-up noncontrast  CT on a nonemergent basis for complete characterization. 3. Mild dilation of the main pancreatic duct in the head of the pancreas with smooth tapering distally, correlate with current evidence of or prior history of pancreatitis. No peripancreatic stranding on today's study. 4-62mm main dilation of PD 4. Apical pulmonary nodules in the setting of pleural and parenchymal scarring are of uncertain significance. Largest approximately 8 mm. Non-contrast chest CT at 3-6 months is recommended. If the nodules are stable at time of repeat CT, then future CT at 18-24  months (from today's scan) is considered optional for low-risk patients, but is recommended for high-risk patients. This recommendation follows the consensus statement: Guidelines for Management of Incidental Pulmonary Nodules Detected on CT Images: From the Fleischner Society 2017; Radiology 2017; 284:228-243. 5. Normal appendix. 6. Calcified coronary artery disease. 7. Aortic atherosclerosis.  Aortic Atherosclerosis (ICD10-I70.0).  CT head - 05/17/20 - IMPRESSION: Small infarcts within the bilateral cerebellar hemispheres are favored subacute or chronic given the degree of hypodensity. Brain MRI may be obtained for confirmation, as clinically warranted.  Otherwise unremarkable non-contrast CT appearance of the brain for Age.    Past Medical History:  Diagnosis Date  . Aortic atherosclerosis ([place])   . CAD (coronary artery disease)   . Cardiomyopathy ([place])   . CHF (congestive heart failure) ([place])   . Diabetes ([place])   . Diverticulosis   . Hiatal hernia   . Hyponatremia   . Neuropathy   . 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 HERNIA REPAIR     Family History  Problem Relation Age of Onset  . Colon cancer Neg Hx   . Esophageal cancer Neg Hx   . Stomach cancer Neg Hx    Social History   Tobacco Use  . Smoking status: Never Smoker  . Smokeless tobacco: Never Used  Vaping Use  . Vaping Use: Never used  Substance Use Topics  . Alcohol use: Never    Comment: stopped 1982  . Drug use: Never   Current Outpatient Medications  Medication Sig Dispense Refill  . Ascorbic Acid (VITAMIN C) 1000 MG tablet Take 500 mg by mouth daily.    . carvedilol (COREG) 6.25 MG tablet Take 6.25 mg by mouth 2 (two) times daily.    . cephALEXin (KEFLEX) 500 MG capsule Take 1 capsule (500 mg total) by mouth every 6 (six) hours. 12 capsule 0  . [employer] CALCIUM 600 MG tablet Take 1 tablet by mouth 2 (two) times  daily.    Marland Kitchen ENTRESTO 24-26 MG Take 1 tablet by mouth 2 (two) times daily.    . feeding supplement (ENSURE ENLIVE / ENSURE PLUS) LIQD Take 237 mLs by mouth 2 (two) times daily between meals. 14220 mL 0  . glipiZIDE (GLUCOTROL) 5 MG tablet Take by mouth.    . Multiple Vitamin (MULTIVITAMIN WITH MINERALS) TABS tablet Take 1 tablet by mouth daily. 90 tablet 1  . pantoprazole (PROTONIX) 40 MG tablet Take 1 tablet (40 mg total) by mouth daily. 90 tablet 0  . sodium chloride 1 g tablet Take 1 tablet (1 g total) by mouth 3 (three) times daily with meals. 21 tablet 0  . Thiamine HCl (VITAMIN B-1) 250 MG tablet Take 250 mg by mouth daily.    . vitamin B-12 (CYANOCOBALAMIN) 1000 MCG tablet Take 1,000 mcg by mouth daily.     No current facility-administered medications for this  visit.   No Known Allergies   Review of Systems: All systems reviewed and negative except where noted in HPI.   Lab Results  Component Value Date   WBC 6.1 06/25/2020   HGB 13.4 06/25/2020   HCT 36.1 (L) 06/25/2020   MCV 93.5 06/25/2020   PLT 231 06/25/2020    Lab Results  Component Value Date   CREATININE 0.79 07/23/2020   BUN 19 07/23/2020   NA 135 07/23/2020   K 3.8 07/23/2020   CL 101 07/23/2020   CO2 28 07/23/2020    Lab Results  Component Value Date   ALT 23 06/22/2020   AST 20 06/22/2020   ALKPHOS 46 06/22/2020   BILITOT 1.0 06/22/2020    Lab Results  Component Value Date   LIPASE 54 (H) 06/21/2020     Physical Exam: BP 116/64   Pulse 62   Ht 5\' 11"  (1.803 m)   Wt 119 lb (54 kg)   BMI 16.60 kg/m  Constitutional: Pleasant,well-developed, male in no acute distress, using walker to ambulate  Cardiovascular: Normal rate, regular rhythm.  Pulmonary/chest: Effort normal and breath sounds normal. No wheezing, rales or rhonchi. Abdominal: Soft, nondistended, nontender, scaphoid abdomen.  There are no masses palpable.  Extremities: no edema Lymphadenopathy: No cervical adenopathy  noted. Neurological: Alert and oriented to person place and time. Skin: Skin is warm and dry. No rashes noted. Psychiatric: Normal mood and affect. Behavior is normal.   ASSESSMENT AND PLAN: 75 year old male here for reassessment of the following:  Abnormal CT GI tract - esophageal thickening, abnormal pancreas Weight loss Hyponatremia Diabetes  As above, the patient has had significant weight loss in recent months.  Course was complicated by newly diagnosed diabetes with associated sensory neuropathy and foot drop, also was hospitalized with hyponatremia and a UTI.  During this course he has had a CT scan of his abdomen and pelvis which showed some thickening of his esophagus which is nonspecific, he has no symptoms in relation to this but is on PPI empirically.  He also has a mild dilation of his pancreatic duct.  He is doing much better since management of his diabetes has started and he is in physical therapy for his neuropathy.  Now eating much better.  We discussed these issues for a bit today.  Given his CT findings am recommending an upper endoscopy to ensure no malignancy of the esophagus, rule out esophagitis and Barrett's esophagitis.  I discussed risk benefits of this and he wants to proceed, he feels he is stable to tolerate this at present time.  He has never had a prior colonoscopy and that is recommended to be done at some point time, however given his recent hospitalization with hyponatremia and that he remains rather weak to tolerate a bowel prep, will hold off on colonoscopy right now, perhaps consider this in a few more months.  In light of his pancreatic ductal dilation noted on CT scan, while there is no obvious mass lesion on CT, recommending MRCP to further evaluate this in light of his dramatic weight loss.  He has no diarrhea concerning for pancreatic insufficiency at this time.  He is agreeable to MRCP after discussion of this.  I will otherwise recheck renal function /  electrolytes today to ensure his sodium levels are stable following his hospitalization for this, he needs follow-up with his primary care for this issue.  Once that has been stable over time we can consider colonoscopy pending his course.  He  agreed with the plan, all questions answered.  Tees Toh Cellar, MD [facility]

## 2020-07-25 DIAGNOSIS — M21372 Foot drop, left foot: Secondary | ICD-10-CM | POA: Diagnosis not present

## 2020-07-26 ENCOUNTER — Ambulatory Visit: Payer: Medicare HMO

## 2020-07-27 ENCOUNTER — Ambulatory Visit (AMBULATORY_SURGERY_CENTER): Payer: Medicare HMO | Admitting: Gastroenterology

## 2020-07-27 ENCOUNTER — Other Ambulatory Visit: Payer: Self-pay

## 2020-07-27 ENCOUNTER — Encounter: Payer: Self-pay | Admitting: Gastroenterology

## 2020-07-27 VITALS — BP 120/73 | HR 75 | Temp 97.3°F | Resp 11 | Ht 71.0 in | Wt 119.0 lb

## 2020-07-27 DIAGNOSIS — K2270 Barrett's esophagus without dysplasia: Secondary | ICD-10-CM

## 2020-07-27 DIAGNOSIS — K319 Disease of stomach and duodenum, unspecified: Secondary | ICD-10-CM

## 2020-07-27 DIAGNOSIS — R933 Abnormal findings on diagnostic imaging of other parts of digestive tract: Secondary | ICD-10-CM

## 2020-07-27 DIAGNOSIS — K3189 Other diseases of stomach and duodenum: Secondary | ICD-10-CM | POA: Diagnosis not present

## 2020-07-27 DIAGNOSIS — R634 Abnormal weight loss: Secondary | ICD-10-CM | POA: Diagnosis not present

## 2020-07-27 DIAGNOSIS — K449 Diaphragmatic hernia without obstruction or gangrene: Secondary | ICD-10-CM | POA: Diagnosis not present

## 2020-07-27 MED ORDER — SODIUM CHLORIDE 0.9 % IV SOLN
500.0000 mL | INTRAVENOUS | Status: DC
Start: 2020-07-27 — End: 2020-07-27

## 2020-07-27 NOTE — Progress Notes (Signed)
Called to room to assist during endoscopic procedure.  Patient ID and intended procedure confirmed with present staff. Received instructions for my participation in the procedure from the performing physician.  

## 2020-07-27 NOTE — Op Note (Signed)
[place] Patient Name: Aaron Rush Procedure Date: 07/27/2020 4:21 PM MRN: [id number] Endoscopist: Remo Lipps P. Havery Moros , MD Age: 75 Referring MD:  Date of Birth: 02-18-1946 Gender: Male Account #: [id number] Procedure:                Upper GI endoscopy Indications:              Abnormal CT of the GI tract (esophageal                            thickening), Weight loss - newly diagnosed diabetes                            with severe lower extremity neuropathy, on protonix                            without upper tract complaints at this time,                            history of hyponatremia Medicines:                Monitored Anesthesia Care Procedure:                Pre-Anesthesia Assessment:                           - Prior to the procedure, a History and Physical                            was performed, and patient medications and                            allergies were reviewed. The patient's tolerance of                            previous anesthesia was also reviewed. The risks                            and benefits of the procedure and the sedation                            options and risks were discussed with the patient.                            All questions were answered, and informed consent                            was obtained. Prior Anticoagulants: The patient has                            taken no previous anticoagulant or antiplatelet                            agents. ASA Grade Assessment: III - A patient with  severe systemic disease. After reviewing the risks                            and benefits, the patient was deemed in                            satisfactory condition to undergo the procedure.                           After obtaining informed consent, the endoscope was                            passed under direct vision. Throughout the                            procedure, the patient's blood  pressure, pulse, and                            oxygen saturations were monitored continuously. The                            Endoscope was introduced through the mouth, and                            advanced to the second part of duodenum. The upper                            GI endoscopy was accomplished without difficulty.                            The patient tolerated the procedure well. Scope In: Scope Out: Findings:                 Esophagogastric landmarks were identified: the                            Z-line was found at 33 cm, the gastroesophageal                            junction was found at 34 cm and the upper extent of                            the gastric folds was found at 41 cm from the                            incisors.                           A 7 cm hiatal hernia was present.                           The Z-line was irregular with a 10-66mm tongue of  salmon colored mucosa without nodularity. Biopsies                            were taken with a cold forceps for histology.                           The examined esophagus was tortuous.                           The exam of the esophagus was otherwise normal.                           The entire examined stomach was normal other than                            some very mild focal areas of erythema. No ulcers                            or mass lesions. Biopsies were taken with a cold                            forceps for Helicobacter pylori testing.                           The duodenal bulb and second portion of the                            duodenum were normal. Complications:            No immediate complications. Estimated blood loss:                            Minimal. Estimated Blood Loss:     Estimated blood loss was minimal. Impression:               - Esophagogastric landmarks identified.                           - 7 cm hiatal hernia.                           - Tortous  esophagus                           - Z-line irregular as outlined. Biopsied.                           - Mild gastric erythema. Normal stomach otherwise.                            Biopsied.                           - Normal duodenal bulb and second portion of the  duodenum. Recommendation:           - Patient has a contact number available for                            emergencies. The signs and symptoms of potential                            delayed complications were discussed with the                            patient. Return to normal activities tomorrow.                            Written discharge instructions were provided to the                            patient.                           - Resume previous diet.                           - Continue present medications.                           - Await pathology results.                           - Schedule colonoscopy when feeling better and can                            tolerate bowel prep / sodium levels stable Steven P. Armbruster, MD 07/27/2020 4:49:57 PM This report has been signed electronically.

## 2020-07-27 NOTE — Progress Notes (Signed)
PT taken to PACU. Monitors in place. VSS. Report given to RN. 

## 2020-07-27 NOTE — Patient Instructions (Addendum)
YOU HAD AN ENDOSCOPIC PROCEDURE TODAY AT THE [facility]:   Refer to the procedure report that was given to you for any specific questions about what was found during the examination.  If the procedure report does not answer your questions, please call your gastroenterologist to clarify.  If you requested that your care partner not be given the details of your procedure findings, then the procedure report has been included in a sealed envelope for you to review at your convenience later.  YOU SHOULD EXPECT: Some feelings of bloating in the abdomen. Passage of more gas than usual.  Walking can help get rid of the air that was put into your GI tract during the procedure and reduce the bloating. If you had a lower endoscopy (such as a colonoscopy or flexible sigmoidoscopy) you may notice spotting of blood in your stool or on the toilet paper. If you underwent a bowel prep for your procedure, you may not have a normal bowel movement for a few days.  Please Note:  You might notice some irritation and congestion in your nose or some drainage.  This is from the oxygen used during your procedure.  There is no need for concern and it should clear up in a day or so.  SYMPTOMS TO REPORT IMMEDIATELY:    Following upper endoscopy (EGD)  Vomiting of blood or coffee ground material  New chest pain or pain under the shoulder blades  Painful or persistently difficult swallowing  New shortness of breath  Fever of 100F or higher  Black, tarry-looking stools  For urgent or emergent issues, a gastroenterologist can be reached at any hour by calling [phone]. Do not use MyChart messaging for urgent concerns.    DIET:  We do recommend a small meal at first, but then you may proceed to your regular diet.  Drink plenty of fluids but you should avoid alcoholic beverages for 24 hours.  ACTIVITY:  You should plan to take it easy for the rest of today and you should NOT DRIVE or use heavy machinery  until tomorrow (because of the sedation medicines used during the test).    FOLLOW UP: Our staff will call the number listed on your records 48-72 hours following your procedure to check on you and address any questions or concerns that you may have regarding the information given to you following your procedure. If we do not reach you, we will leave a message.  We will attempt to reach you two times.  During this call, we will ask if you have developed any symptoms of COVID 19. If you develop any symptoms (ie: fever, flu-like symptoms, shortness of breath, cough etc.) before then, please call [phone].  If you test positive for Covid 19 in the 2 weeks post procedure, please call and report this information to us.    If any biopsies were taken you will be contacted by phone or by letter within the next 1-3 weeks.  Please call us at [phone] if you have not heard about the biopsies in 3 weeks.    SIGNATURES/CONFIDENTIALITY: You and/or your care partner have signed paperwork which will be entered into your electronic medical record.  These signatures attest to the fact that that the information above on your After Visit Summary has been reviewed and is understood.  Full responsibility of the confidentiality of this discharge information lies with you and/or your care-partner. 

## 2020-07-31 ENCOUNTER — Telehealth: Payer: Self-pay

## 2020-07-31 DIAGNOSIS — M21372 Foot drop, left foot: Secondary | ICD-10-CM | POA: Diagnosis not present

## 2020-07-31 DIAGNOSIS — E119 Type 2 diabetes mellitus without complications: Secondary | ICD-10-CM | POA: Diagnosis not present

## 2020-07-31 DIAGNOSIS — E871 Hypo-osmolality and hyponatremia: Secondary | ICD-10-CM | POA: Diagnosis not present

## 2020-07-31 DIAGNOSIS — E1340 Other specified diabetes mellitus with diabetic neuropathy, unspecified: Secondary | ICD-10-CM | POA: Diagnosis not present

## 2020-07-31 DIAGNOSIS — K219 Gastro-esophageal reflux disease without esophagitis: Secondary | ICD-10-CM | POA: Diagnosis not present

## 2020-07-31 DIAGNOSIS — R634 Abnormal weight loss: Secondary | ICD-10-CM | POA: Diagnosis not present

## 2020-07-31 NOTE — Telephone Encounter (Signed)
Attempted to reach pt. With follow-up call following endoscopic procedure 05/17/2021.  LM on pt. Voice mail to call if he has any questions or concerns. 

## 2020-08-01 DIAGNOSIS — M21372 Foot drop, left foot: Secondary | ICD-10-CM | POA: Diagnosis not present

## 2020-08-02 ENCOUNTER — Ambulatory Visit: Payer: Medicare HMO

## 2020-08-06 DIAGNOSIS — M21372 Foot drop, left foot: Secondary | ICD-10-CM | POA: Diagnosis not present

## 2020-08-09 ENCOUNTER — Ambulatory Visit: Payer: Medicare HMO

## 2020-08-15 ENCOUNTER — Other Ambulatory Visit: Payer: Self-pay

## 2020-08-15 ENCOUNTER — Other Ambulatory Visit: Payer: Self-pay | Admitting: Gastroenterology

## 2020-08-15 ENCOUNTER — Ambulatory Visit (HOSPITAL_COMMUNITY)
Admission: RE | Admit: 2020-08-15 | Discharge: 2020-08-15 | Disposition: A | Discharge status: Home / Self Care | Payer: Medicare HMO | Source: Ambulatory Visit | Attending: Gastroenterology | Admitting: Gastroenterology

## 2020-08-15 DIAGNOSIS — R933 Abnormal findings on diagnostic imaging of other parts of digestive tract: Secondary | ICD-10-CM

## 2020-08-15 DIAGNOSIS — R634 Abnormal weight loss: Secondary | ICD-10-CM | POA: Insufficient documentation

## 2020-08-15 DIAGNOSIS — E871 Hypo-osmolality and hyponatremia: Secondary | ICD-10-CM | POA: Diagnosis not present

## 2020-08-15 DIAGNOSIS — N281 Cyst of kidney, acquired: Secondary | ICD-10-CM | POA: Diagnosis not present

## 2020-08-15 DIAGNOSIS — K8689 Other specified diseases of pancreas: Secondary | ICD-10-CM | POA: Diagnosis not present

## 2020-08-15 DIAGNOSIS — E1149 Type 2 diabetes mellitus with other diabetic neurological complication: Secondary | ICD-10-CM | POA: Insufficient documentation

## 2020-08-15 MED ORDER — GADOBUTROL 1 MMOL/ML IV SOLN
7.5000 mL | Freq: Once | INTRAVENOUS | Status: AC | PRN
  Administered 2020-08-15: 7.5 mL via INTRAVENOUS

## 2020-08-17 DIAGNOSIS — M21372 Foot drop, left foot: Secondary | ICD-10-CM | POA: Diagnosis not present

## 2020-08-21 DIAGNOSIS — M21372 Foot drop, left foot: Secondary | ICD-10-CM | POA: Diagnosis not present

## 2020-08-23 DIAGNOSIS — M21372 Foot drop, left foot: Secondary | ICD-10-CM | POA: Diagnosis not present

## 2020-08-24 ENCOUNTER — Encounter: Payer: Self-pay | Admitting: Neurology

## 2020-08-24 ENCOUNTER — Ambulatory Visit: Payer: Medicare HMO | Admitting: Neurology

## 2020-08-24 ENCOUNTER — Other Ambulatory Visit: Payer: Self-pay

## 2020-08-24 VITALS — BP 111/71 | HR 98 | Resp 18 | Ht 71.0 in | Wt 117.0 lb

## 2020-08-24 DIAGNOSIS — E1142 Type 2 diabetes mellitus with diabetic polyneuropathy: Secondary | ICD-10-CM

## 2020-08-24 NOTE — Patient Instructions (Signed)
Return to clinic in 3-4 months. 

## 2020-08-24 NOTE — Progress Notes (Signed)
Follow-up Visit   Date: 08/24/20   Aaron Rush MRN: [id number] DOB: [birth year]   Interim History: Aaron Rush is a 75 y.o. right-handed Caucasian male with cardiomyopathy, hypertension, CAD returning to the clinic for follow-up of sensorimotor polyneuropathy.  The patient was accompanied to the clinic by sister Aaron Rush) who also provides collateral information.    History of present illness: Starting around June 2021, he began having tingling in the feet which progressed into his lower legs.  The same time, he developed imbalance which steadily progressed where he needed to use a cane and has been using a walker since October.  Over the past two month, his left foot started to drag and he has been unable to move his toes.  He is falling several times per week, but is able to get up by himself. No low back pain, bowel/bladder issues. He denies weakness in the face and hands.     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  He lives in a 2-level home by himself. No children.    UPDATE 06/15/2020:   Since his visit, he was diagnosed with diabetes HbA1c ~ 9 and started on metformin.  Patient's sister is concerned because they did not get diabetes education and would like resources.  He also has CSF testing which shows markedly elevated CSF protein and glucose.  He has been going to out-pt PT and feels that the muscle tone in the legs is improving.  No change in the severity of his left leg weakness.   UPDATE 08/24/2020:  He is here for follow-up visit.  He has been compliant with PT and reports having some improvement in balance and was able to walk with a cane a short distance for the first time. He continues to have left foot drop and wears and AFO.  His diabetes is better managed with HbA1c 7.7.     Medications:  Current Outpatient Medications on File Prior to Visit   Medication Sig Dispense Refill  . Ascorbic Acid (VITAMIN C) 1000 MG tablet Take 500 mg by mouth daily.    . carvedilol (COREG) 6.25 MG tablet Take 6.25 mg by mouth 2 (two) times daily.    . [facility] CALCIUM 600 MG tablet Take 1 tablet by mouth 2 (two) times daily.    Marland Kitchen ENTRESTO 24-26 MG Take 1 tablet by mouth 2 (two) times daily.    . furosemide (LASIX) 20 MG tablet Take 20 mg by mouth daily.    Marland Kitchen glipiZIDE (GLUCOTROL) 5 MG tablet Take by mouth.    . Multiple Vitamin (MULTIVITAMIN WITH MINERALS) TABS tablet Take 1 tablet by mouth daily. 90 tablet 1  . pantoprazole (PROTONIX) 40 MG tablet Take 1 tablet (40 mg total) by mouth daily. 90 tablet 0  . sodium chloride 1 g tablet Take 1 tablet (1 g total) by mouth 3 (three) times daily with meals. 21 tablet 0  . spironolactone (ALDACTONE) 25 MG tablet Take 25 mg by mouth daily.    . Thiamine HCl (VITAMIN B-1) 250 MG tablet Take 250 mg by mouth daily.    . vitamin B-12 (CYANOCOBALAMIN) 1000 MCG tablet Take 1,000 mcg by mouth daily.    . cephALEXin (KEFLEX) 500 MG capsule Take 1 capsule (500 mg total) by mouth every 6 (six) hours. (Patient not taking: No sig reported) 12  capsule 0   No current facility-administered medications on file prior to visit.    Allergies: No Known Allergies  Vital Signs:  BP 111/71   Pulse 98   Resp 18   Ht 5\' 11"  (1.803 m)   Wt 117 lb (53.1 kg)   SpO2 100%   BMI 16.32 kg/m    Neurological Exam: MENTAL STATUS including orientation to time, place, person, recent and remote memory, attention span and concentration, language, and fund of knowledge is normal.  Speech is not dysarthric.  CRANIAL NERVES:  Pupils equal round and reactive to light.  Normal conjugate, extra-ocular eye movements in all directions of gaze.  No ptosis   MOTOR:  Generalized loss of muscle bulk throughout, especially in the legs where there is atrophy.  No fasciculations or abnormal movements.  No pronator drift.   Upper Extremity:  Right  Left   Deltoid  5/5   5/5   Biceps  5/5   5/5   Triceps  5/5   5/5   Infraspinatus 5/5  5/5  Medial pectoralis 5/5  5/5  Wrist extensors  5/5   5/5   Wrist flexors  5/5   5/5   Finger extensors  5/5   5/5   Finger flexors  5/5   5/5   Dorsal interossei  5/5   5/5   Abductor pollicis  5/5   5/5   Tone (Ashworth scale)  0  0   Lower Extremity:  Right  Left  Hip flexors  4/5   3/5   Hip extensors  4/5   3/5   Adductor 4+/5  4/5  Abductor 4+/5  4/5  Knee flexors  4+/5   4/5   Knee extensors  4+/5   4/5   Dorsiflexors  4+/5   1/5   Plantarflexors  4+/5   1/5   Toe extensors  4/5   1/5   Toe flexors  4/5   1/5   Tone (Ashworth scale)  0  0   MSRs:  Reflexes are 2+/4 throughout in the arms and absent in the legs. .  SENSORY:  Absent temperature and vibration distal to lower legs bilaterally.    COORDINATION/GAIT: Gait is assisted with walker and left AFO, appears stable.    Data: NCS/EMG of the legs 06/15/2020: The electrophysiologic findings are consistent with an asymmetric, subacute sensorimotor polyradiculoneuropathy affecting the lower extremities.  Overall, these findings are severe in degree electrically.  CSF 05/31/2020:  CSF R3 W0 G106** P124**  IgG index 0.49, ACE, Lyme, cytology, OCB -neg  Lab Results  Component Value Date   HGBA1C 7.7 (H) 06/22/2020    IMPRESSION/PLAN: Diabetic polyradiculoneuropathy - severe.  He has asymmetric leg weakness with left flail foot, distal sensory loss, and arreflexia with CSF and EMG consistent with polyradiculoneuropathy.  Exam is stable without new changes, and residual weakness in the legs with left flail foot. Continue PT He is compliant with wearing AFO Continue to optimize diabetes If there is worsening of symptoms, consider sending paraneoplastic panel  Return to clinic in 3-4 months   Thank you for allowing me to participate in patient's care.  If I can answer any additional questions, I would be pleased to do so.     Sincerely,    Dinisha Cai K. Posey Pronto, DO

## 2020-08-27 DIAGNOSIS — M21372 Foot drop, left foot: Secondary | ICD-10-CM | POA: Diagnosis not present

## 2020-08-29 DIAGNOSIS — M21372 Foot drop, left foot: Secondary | ICD-10-CM | POA: Diagnosis not present

## 2020-09-05 DIAGNOSIS — M21372 Foot drop, left foot: Secondary | ICD-10-CM | POA: Diagnosis not present

## 2020-09-07 DIAGNOSIS — M21372 Foot drop, left foot: Secondary | ICD-10-CM | POA: Diagnosis not present

## 2020-09-10 DIAGNOSIS — M21372 Foot drop, left foot: Secondary | ICD-10-CM | POA: Diagnosis not present

## 2020-09-11 DIAGNOSIS — Z8673 Personal history of transient ischemic attack (TIA), and cerebral infarction without residual deficits: Secondary | ICD-10-CM | POA: Diagnosis not present

## 2020-09-11 DIAGNOSIS — I5022 Chronic systolic (congestive) heart failure: Secondary | ICD-10-CM | POA: Diagnosis not present

## 2020-09-11 DIAGNOSIS — E119 Type 2 diabetes mellitus without complications: Secondary | ICD-10-CM | POA: Diagnosis not present

## 2020-09-11 DIAGNOSIS — K219 Gastro-esophageal reflux disease without esophagitis: Secondary | ICD-10-CM | POA: Diagnosis not present

## 2020-09-11 DIAGNOSIS — M21372 Foot drop, left foot: Secondary | ICD-10-CM | POA: Diagnosis not present

## 2020-09-11 DIAGNOSIS — E1342 Other specified diabetes mellitus with diabetic polyneuropathy: Secondary | ICD-10-CM | POA: Diagnosis not present

## 2020-09-11 DIAGNOSIS — E871 Hypo-osmolality and hyponatremia: Secondary | ICD-10-CM | POA: Diagnosis not present

## 2020-09-13 DIAGNOSIS — M21372 Foot drop, left foot: Secondary | ICD-10-CM | POA: Diagnosis not present

## 2020-09-17 DIAGNOSIS — M21372 Foot drop, left foot: Secondary | ICD-10-CM | POA: Diagnosis not present

## 2020-09-20 DIAGNOSIS — M21372 Foot drop, left foot: Secondary | ICD-10-CM | POA: Diagnosis not present

## 2020-09-24 DIAGNOSIS — M21372 Foot drop, left foot: Secondary | ICD-10-CM | POA: Diagnosis not present

## 2020-09-25 DIAGNOSIS — K219 Gastro-esophageal reflux disease without esophagitis: Secondary | ICD-10-CM | POA: Diagnosis not present

## 2020-09-25 DIAGNOSIS — E1142 Type 2 diabetes mellitus with diabetic polyneuropathy: Secondary | ICD-10-CM | POA: Diagnosis not present

## 2020-09-25 DIAGNOSIS — I509 Heart failure, unspecified: Secondary | ICD-10-CM | POA: Diagnosis not present

## 2020-09-25 DIAGNOSIS — E871 Hypo-osmolality and hyponatremia: Secondary | ICD-10-CM | POA: Diagnosis not present

## 2020-09-26 DIAGNOSIS — M21372 Foot drop, left foot: Secondary | ICD-10-CM | POA: Diagnosis not present

## 2020-10-08 ENCOUNTER — Ambulatory Visit: Payer: Medicare HMO | Admitting: Dietician

## 2020-10-12 ENCOUNTER — Other Ambulatory Visit (INDEPENDENT_AMBULATORY_CARE_PROVIDER_SITE_OTHER): Payer: Medicare HMO

## 2020-10-12 ENCOUNTER — Ambulatory Visit: Payer: Medicare HMO | Admitting: Gastroenterology

## 2020-10-12 ENCOUNTER — Encounter: Payer: Self-pay | Admitting: Gastroenterology

## 2020-10-12 VITALS — BP 122/70 | HR 88 | Ht 71.0 in | Wt 118.0 lb

## 2020-10-12 DIAGNOSIS — R933 Abnormal findings on diagnostic imaging of other parts of digestive tract: Secondary | ICD-10-CM

## 2020-10-12 DIAGNOSIS — K2270 Barrett's esophagus without dysplasia: Secondary | ICD-10-CM | POA: Diagnosis not present

## 2020-10-12 DIAGNOSIS — R9389 Abnormal findings on diagnostic imaging of other specified body structures: Secondary | ICD-10-CM | POA: Diagnosis not present

## 2020-10-12 DIAGNOSIS — D539 Nutritional anemia, unspecified: Secondary | ICD-10-CM

## 2020-10-12 DIAGNOSIS — R634 Abnormal weight loss: Secondary | ICD-10-CM

## 2020-10-12 DIAGNOSIS — Z1211 Encounter for screening for malignant neoplasm of colon: Secondary | ICD-10-CM

## 2020-10-12 DIAGNOSIS — E1149 Type 2 diabetes mellitus with other diabetic neurological complication: Secondary | ICD-10-CM

## 2020-10-12 LAB — BASIC METABOLIC PANEL
BUN: 25 mg/dL — ABNORMAL HIGH (ref 6–23)
CO2: 29 mEq/L (ref 19–32)
Calcium: 9.9 mg/dL (ref 8.4–10.5)
Chloride: 103 mEq/L (ref 96–112)
Creatinine, Ser: 0.69 mg/dL (ref 0.40–1.50)
GFR: 92.1 mL/min (ref >60.00)
Glucose, Bld: 180 mg/dL — ABNORMAL HIGH (ref 70–99)
Potassium: 3.5 mEq/L (ref 3.5–5.1)
Sodium: 139 mEq/L (ref 135–145)

## 2020-10-12 LAB — CBC WITH DIFFERENTIAL/PLATELET
Basophils Absolute: 0 10*3/uL (ref 0.0–0.1)
Basophils Relative: 0.4 % (ref 0.0–3.0)
Eosinophils Absolute: 0.1 10*3/uL (ref 0.0–0.7)
Eosinophils Relative: 1.9 % (ref 0.0–5.0)
HCT: 37 % — ABNORMAL LOW (ref 39.0–52.0)
Hemoglobin: 12.9 g/dL — ABNORMAL LOW (ref 13.0–17.0)
Lymphocytes Relative: 21.3 % (ref 12.0–46.0)
Lymphs Abs: 1.5 10*3/uL (ref 0.7–4.0)
MCHC: 34.9 g/dL (ref 30.0–36.0)
MCV: 98.7 fl (ref 78.0–100.0)
Monocytes Absolute: 0.6 10*3/uL (ref 0.1–1.0)
Monocytes Relative: 8.4 % (ref 3.0–12.0)
Neutro Abs: 4.7 10*3/uL (ref 1.4–7.7)
Neutrophils Relative %: 68 % (ref 43.0–77.0)
Platelets: 215 10*3/uL (ref 150.0–400.0)
RBC: 3.75 Mil/uL — ABNORMAL LOW (ref 4.22–5.81)
RDW: 12.6 % (ref 11.5–15.5)
WBC: 6.8 10*3/uL (ref 4.0–10.5)

## 2020-10-12 MED ORDER — PLENVU 140 G PO SOLR: 1.0000 | Freq: Once | ORAL | 0 refills | Status: AC

## 2020-10-12 NOTE — Progress Notes (Signed)
HPI :  75 year old male here for a follow up visit for weight loss, abnormal imaging of the GI tract.   Recall that he went to the ED in early November due to progressive lower extremity weakness and looking to get a Neurology evaluation. During that time he endorsed 30 lbs of weight loss, poor appetite. He had extensive imaging at that visit in the ER to include CT scan of the head, CT scan of the chest abdomen pelvis, CTA of the neck with and without contrast. 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 He had very mild dilation of the main pancreatic duct to 4 to 5 mm in diameter. No pancreatic mass lesions. Some apical pulmonary nodules, small angiomyolipoma of the left kidney. He was empirically placed on Protonix 20 mg a day but denied any reflux symptoms that bothered him. He follows chronically with cardiology, he has a history of CAD and CHF, on Entresto.   He was extensively worked up by neurology and found to have new onset diabetes with a hemoglobin A1c of 9.6.  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  Since he has been on therapy for this his blood sugars have been much better controlled.  He is working with physical therapy regarding his ambulatory status.  During his course he was admitted in December with hyponatremia and was placed on salt tablet 3 times daily which he continues.  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  He was found to have a UTI during that time.  Since his last visit with me he had a upper endoscopy to clarify CT findings.  This was done in January.  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  Otherwise no other concerning findings.  Gastric biopsies negative for H. pylori.  He also had an MRCP to further  clarify pancreatic findings, this was done in February.  He had persistent mild dilation of the main pancreatic duct without any mass lesions.  Radiology recommended repeat MRI in 6 to 12 months.  He is accompanied by his wife today.  She is a bit frustrated that he has not been able to regain weight, thinks he lost another pound over the past week.  The patient clinically feels much better however.  He states he is eating 3 meals a day including a few caloric supplement shakes.  He has a good appetite.  He denies any pains at all.  He does feel fatigued and tired.  No problems with his bowels.  No blood in his stools.  He denies any cardiopulmonary symptoms.  His sodium has normalized as of March 1.  He has never had a colonoscopy, we discussed at the last visit that this was recommended but he had not fully recovered from his hospitalization 1 more time to prepare for this.  He states he is willing to get this scheduled at this point time.  Wife has concerns about his continued weight loss with his pancreatic imaging and newly diagnosed diabetes, we discussed options for that.  We also discussed timing of follow-up CT scan of his chest based on his last exam in the emergency department.  Of note, he has developed a macrocytosis with mild anemia. As of March 1st, Hgb 12.8 with MCV > 1000.  He has normal B12 and folate levels, normal thyroid.    Prior workup: CT abdomen / pelvis 05/17/20 -IMPRESSION: 1. Large hiatal hernia. Mild circumferential distal esophageal thickening, may represent esophagitis. 2. Potential fat containing lesion in the LEFT kidney may represent a small angiomyolipoma. Consider follow-up noncontrast CT on a nonemergent basis for complete characterization. 3. Mild dilation of the main pancreatic duct in the head of the pancreas with smooth tapering distally, correlate with current evidence of or prior history of pancreatitis. No peripancreatic stranding on today's study.4-44m  main dilation of PD 4. Apical pulmonary nodules in the setting of pleural and parenchymal scarring are of uncertain significance. Largest approximately 8 mm. Non-contrast chest CT at 3-6 months is recommended. If the nodules are stable at time of repeat CT, then future CT at 18-24 months (from today's scan) is considered optional for low-risk patients, but is recommended for high-risk patients. This recommendation follows the consensus statement: Guidelines for Management of Incidental Pulmonary Nodules Detected on CT Images: From the Fleischner Society 2017; Radiology 2017; 284:228-243. 5. Normal appendix. 6. Calcified coronary artery disease. 7. Aortic atherosclerosis.  Aortic Atherosclerosis (ICD10-I70.0).  CT head - 05/17/20 -IMPRESSION: Small infarcts within the bilateral cerebellar hemispheres are favored subacute or chronic given the degree of hypodensity. Brain MRI may be obtained for confirmation, as clinically warranted.  Otherwise unremarkable non-contrast CT appearance of the brain for Age.   EGD 07/27/20 -  - A 7 cm hiatal hernia was present. - The Z-line was irregular with a 10-114mtongue of salmon colored mucosa without nodularity. Biopsies were taken with a cold forceps for histology. - The examined esophagus was tortuous. - The exam of the esophagus was otherwise normal. - The entire examined stomach was normal other than some very mild focal areas of erythema. No ulcers or mass lesions. Biopsies were taken with a cold forceps for Helicobacter pylori testing. - The duodenal bulb and second portion of the duodenum were normal.  1. Surgical [P], gastric antrum and gastric body - MILD REACTIVE GASTROPATHY. - Hinton DyerS NEGATIVE FOR HELICOBACTER PYLORI. - NO INTESTINAL METAPLASIA, DYSPLASIA, OR MALIGNANCY. 2. Surgical [P], esophagus, GE junction - INTESTINAL METAPLASIA CONSISTENT WITH BARRETT'S ESOPHAGUS. - NO DYSPLASIA OR MALIGNANCY.    MRCP  08/15/20 - IMPRESSION: 1. No acute findings within the abdomen. 2. Unchanged mild dilatation of the main pancreatic duct at the level of the head of pancreas. No mass identified. Recommend follow-up imaging in 6-12 months to ensure ongoing stability. 3. Bilateral kidney cysts. There is a septated cyst within the inferior pole of the right kidney. 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 Attention to this lesion on follow-up imaging is advised. 4. Small hiatal hernia. 5.  Aortic Atherosclerosis (ICD10-I70.0).    Past Medical History:  Diagnosis Date  . Aortic atherosclerosis ([facility]  . Barrett's esophagus   . CAD (coronary artery disease)   . Cardiomyopathy ([facility]  . CHF (congestive heart failure) ([place]  . Diabetes ([facility]  . Diverticulosis   . Hiatal hernia   . Hyponatremia   . Myocardial infarction ([facility]  . Neuropathy   . Stroke (cerebrum) ([id number]   [facility]     Past Surgical History:  Procedure Laterality Date  . ESOPHAGOGASTRODUODENOSCOPY    . HERNIA REPAIR     umbilical in the 7028'U Family History  Problem Relation Age of Onset  . COPD Father   . Colon cancer Neg  Hx   . Esophageal cancer Neg Hx   . Stomach cancer Neg Hx   . Rectal cancer Neg Hx    Social History   Tobacco Use  . Smoking status: Never Smoker  . Smokeless tobacco: Never Used  Vaping Use  . Vaping Use: Never used  Substance Use Topics  . Alcohol use: Never    Comment: stopped 1982  . Drug use: Never   Current Outpatient Medications  Medication Sig Dispense Refill  . PEG-KCl-NaCl-NaSulf-Na Asc-C (PLENVU) 140 g SOLR Take 1 kit by mouth once for 1 dose. 1 each 0  . Ascorbic Acid (VITAMIN C) 1000 MG tablet Take 500 mg by mouth daily.    . carvedilol (COREG) 6.25 MG tablet Take 6.25 mg by mouth 2 (two) times daily.    . [facility]S CALCIUM 600 MG tablet Take 1 tablet by mouth 2 (two) times daily.    Marland Kitchen ENTRESTO 24-26 MG Take 1 tablet by mouth 2 (two) times daily.     . furosemide (LASIX) 20 MG tablet Take 20 mg by mouth daily.    Marland Kitchen glipiZIDE (GLUCOTROL) 5 MG tablet Take 5 mg by mouth daily before breakfast.    . Multiple Vitamin (MULTIVITAMIN WITH MINERALS) TABS tablet Take 1 tablet by mouth daily. 90 tablet 1  . pantoprazole (PROTONIX) 40 MG tablet Take 1 tablet (40 mg total) by mouth daily. 90 tablet 0  . sodium chloride 1 g tablet Take 1 tablet (1 g total) by mouth 3 (three) times daily with meals. 21 tablet 0  . spironolactone (ALDACTONE) 25 MG tablet Take 12.5 mg by mouth daily.    . Thiamine HCl (VITAMIN B-1) 250 MG tablet Take 250 mg by mouth daily.    . vitamin B-12 (CYANOCOBALAMIN) 1000 MCG tablet Take 1,000 mcg by mouth daily.     No current facility-administered medications for this visit.   No Known Allergies   Review of Systems: All systems reviewed and negative except where noted in HPI.   Lab Results  Component Value Date   WBC 6.1 06/25/2020   HGB 13.4 06/25/2020   HCT 36.1 (L) 06/25/2020   MCV 93.5 06/25/2020   PLT 231 06/25/2020    Lab Results  Component Value Date   CREATININE 0.79 07/23/2020   BUN 19 07/23/2020   NA 135 07/23/2020   K 3.8 07/23/2020   CL 101 07/23/2020   CO2 28 07/23/2020    Lab Results  Component Value Date   ALT 23 06/22/2020   AST 20 06/22/2020   ALKPHOS 46 06/22/2020   BILITOT 1.0 06/22/2020     Physical Exam: BP 122/70   Pulse 88   Ht _0  (1.803 m)   Wt 118 lb (53.5 kg)   BMI 16.46 kg/m  Constitutional: Pleasant,well-developed, male in no acute distress. Abdominal: Soft, nondistended, nontender. Scaphoid abdomen, There are no masses palpable. No hepatomegaly. Extremities: no edema Lymphadenopathy: No cervical adenopathy noted. Neurological: Alert and oriented to person place and time. Skin: Skin is warm and dry. No rashes noted. Psychiatric: Normal mood and affect. Behavior is normal.   ASSESSMENT AND PLAN: 75 year old male here for reevaluation of the following  issues:  Loss of weight Abnormal CT scan chest Abnormal MRCP -pancreatic ductal dilation Macrocytic anemia Barrett's esophagus Colon cancer screening Diabetes with neuropathy  As above, patient has undergone an extensive work-up for weight loss and neuropathy.  Found to have newly diagnosed diabetes with severe neuropathy causing his problems with ambulation.  Since having  diabetes managed his appetite is much better and is eating better however he is not regaining weight, in fact losing a bit of weight.  He has undergone significant imaging, he has some pulmonary nodules in his chest on prior CT scan of his chest and he is due for a follow-up CT scan chest so we will order that.  I discussed the abnormal MRCP with him, he has a mild pancreatic ductal dilation without any mass lesions.  Patient and wife are concerned about this in light of his diabetes.  I will contact my advanced endoscopy colleagues to get their thoughts on possible EUS given his progressive symptoms although there was no overt mass lesion on his MRCP, if we do not pursue EUS sooner, would plan for an MRCP 6 months from his last exam.  We discussed his upper endoscopy findings, nothing there to cause his symptoms but he did have a short segment of Barrett's esophagus.  Taking Protonix daily at this time, no reflux symptoms, he understands at risk for esophageal cancer but hopefully the risk is overall quite low.  We discussed that he has never had a colonoscopy, for preventative purposes and to evaluate his ongoing weight loss I think a colonoscopy is reasonable.  His sodium levels are normal, I think he can now tolerate a bowel prep now that he is tolerating p.o. quite well.  That being said, his imaging looks okay and he has no bowel symptoms, I am not convinced we are going to find a source for his weight loss in his colon.  Wife has concerns about colonoscopy and whether he is ready for this, they want to schedule this but will do it  at the end of May or June to give him a bit more time to prepare.  We will get the CT scan chest done sooner and consider EUS in the interim as well.  Otherwise noted to have macrocytic anemia, B12, folate, TSH are normal.  We will repeat CBC today to see if this is progressing and consider hematology evaluation if so.  Wife and patient agreement with the plan as outlined  Plan: - continue protonix for Barrett's esophagus - CT scan chest with contrast - will speak with my advance endoscopy colleagues about possible EUS vs. follow up MRCP - CBC today, consideration for Hematology evaluation - Colonoscopy, to be scheduled in next 1-2 months  Further recommendations pending his course and results.  [place] Cellar, MD [facility]

## 2020-10-12 NOTE — H&P (View-Only) (Signed)
HPI :  75 year old male here for a follow up visit for weight loss, abnormal imaging of the GI tract.   Recall that he went to the ED in early November due to progressive lower extremity weakness and looking to get a Neurology evaluation. During that time he endorsed 30 lbs of weight loss, poor appetite. He had extensive imaging at that visit in the ER to include CT scan of the head, CT scan of the chest abdomen pelvis, CTA of the neck with and without contrast. 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 He had very mild dilation of the main pancreatic duct to 4 to 5 mm in diameter. No pancreatic mass lesions. Some apical pulmonary nodules, small angiomyolipoma of the left kidney. He was empirically placed on Protonix 20 mg a day but denied any reflux symptoms that bothered him. He follows chronically with cardiology, he has a history of CAD and CHF, on Entresto.   He was extensively worked up by neurology and found to have new onset diabetes with a hemoglobin A1c of 9.6.  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  Since he has been on therapy for this his blood sugars have been much better controlled.  He is working with physical therapy regarding his ambulatory status.  During his course he was admitted in December with hyponatremia and was placed on salt tablet 3 times daily which he continues.  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  He was found to have a UTI during that time.  Since his last visit with me he had a upper endoscopy to clarify CT findings.  This was done in January.  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  Otherwise no other concerning findings.  Gastric biopsies negative for H. pylori.  He also had an MRCP to further  clarify pancreatic findings, this was done in February.  He had persistent mild dilation of the main pancreatic duct without any mass lesions.  Radiology recommended repeat MRI in 6 to 12 months.  He is accompanied by his wife today.  She is a bit frustrated that he has not been able to regain weight, thinks he lost another pound over the past week.  The patient clinically feels much better however.  He states he is eating 3 meals a day including a few caloric supplement shakes.  He has a good appetite.  He denies any pains at all.  He does feel fatigued and tired.  No problems with his bowels.  No blood in his stools.  He denies any cardiopulmonary symptoms.  His sodium has normalized as of March 1.  He has never had a colonoscopy, we discussed at the last visit that this was recommended but he had not fully recovered from his hospitalization 1 more time to prepare for this.  He states he is willing to get this scheduled at this point time.  Wife has concerns about his continued weight loss with his pancreatic imaging and newly diagnosed diabetes, we discussed options for that.  We also discussed timing of follow-up CT scan of his chest based on his last exam in the emergency department.  Of note, he has developed a macrocytosis with mild anemia. As of March 1st, Hgb 12.8 with MCV > 1000.  He has normal B12 and folate levels, normal thyroid.    Prior workup: CT abdomen / pelvis 05/17/20 -IMPRESSION: 1. Large hiatal hernia. Mild circumferential distal esophageal thickening, may represent esophagitis. 2. Potential fat containing lesion in the LEFT kidney may represent a small angiomyolipoma. Consider follow-up noncontrast CT on a nonemergent basis for complete characterization. 3. Mild dilation of the main pancreatic duct in the head of the pancreas with smooth tapering distally, correlate with current evidence of or prior history of pancreatitis. No peripancreatic stranding on today's study.4-44m  main dilation of PD 4. Apical pulmonary nodules in the setting of pleural and parenchymal scarring are of uncertain significance. Largest approximately 8 mm. Non-contrast chest CT at 3-6 months is recommended. If the nodules are stable at time of repeat CT, then future CT at 18-24 months (from today's scan) is considered optional for low-risk patients, but is recommended for high-risk patients. This recommendation follows the consensus statement: Guidelines for Management of Incidental Pulmonary Nodules Detected on CT Images: From the Fleischner Society 2017; Radiology 2017; 284:228-243. 5. Normal appendix. 6. Calcified coronary artery disease. 7. Aortic atherosclerosis.  Aortic Atherosclerosis (ICD10-I70.0).  CT head - 05/17/20 -IMPRESSION: Small infarcts within the bilateral cerebellar hemispheres are favored subacute or chronic given the degree of hypodensity. Brain MRI may be obtained for confirmation, as clinically warranted.  Otherwise unremarkable non-contrast CT appearance of the brain for Age.   EGD 07/27/20 -  - A 7 cm hiatal hernia was present. - The Z-line was irregular with a 10-114mtongue of salmon colored mucosa without nodularity. Biopsies were taken with a cold forceps for histology. - The examined esophagus was tortuous. - The exam of the esophagus was otherwise normal. - The entire examined stomach was normal other than some very mild focal areas of erythema. No ulcers or mass lesions. Biopsies were taken with a cold forceps for Helicobacter pylori testing. - The duodenal bulb and second portion of the duodenum were normal.  1. Surgical [P], gastric antrum and gastric body - MILD REACTIVE GASTROPATHY. - Hinton DyerS NEGATIVE FOR HELICOBACTER PYLORI. - NO INTESTINAL METAPLASIA, DYSPLASIA, OR MALIGNANCY. 2. Surgical [P], esophagus, GE junction - INTESTINAL METAPLASIA CONSISTENT WITH BARRETT'S ESOPHAGUS. - NO DYSPLASIA OR MALIGNANCY.    MRCP  08/15/20 - IMPRESSION: 1. No acute findings within the abdomen. 2. Unchanged mild dilatation of the main pancreatic duct at the level of the head of pancreas. No mass identified. Recommend follow-up imaging in 6-12 months to ensure ongoing stability. 3. Bilateral kidney cysts. There is a septated cyst within the inferior pole of the right kidney. 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 Attention to this lesion on follow-up imaging is advised. 4. Small hiatal hernia. 5.  Aortic Atherosclerosis (ICD10-I70.0).    Past Medical History:  Diagnosis Date  . Aortic atherosclerosis ([facility]  . Barrett's esophagus   . CAD (coronary artery disease)   . Cardiomyopathy ([facility]  . CHF (congestive heart failure) ([place]  . Diabetes ([place]  . Diverticulosis   . Hiatal hernia   . Hyponatremia   . Myocardial infarction ([facility]  . Neuropathy   . Stroke (cerebrum) ([id number]   [facility]     Past Surgical History:  Procedure Laterality Date  . ESOPHAGOGASTRODUODENOSCOPY    . HERNIA REPAIR     umbilical in the 7028'U Family History  Problem Relation Age of Onset  . COPD Father   . Colon cancer Neg  Hx   . Esophageal cancer Neg Hx   . Stomach cancer Neg Hx   . Rectal cancer Neg Hx    Social History   Tobacco Use  . Smoking status: Never Smoker  . Smokeless tobacco: Never Used  Vaping Use  . Vaping Use: Never used  Substance Use Topics  . Alcohol use: Never    Comment: stopped 1982  . Drug use: Never   Current Outpatient Medications  Medication Sig Dispense Refill  . PEG-KCl-NaCl-NaSulf-Na Asc-C (PLENVU) 140 g SOLR Take 1 kit by mouth once for 1 dose. 1 each 0  . Ascorbic Acid (VITAMIN C) 1000 MG tablet Take 500 mg by mouth daily.    . carvedilol (COREG) 6.25 MG tablet Take 6.25 mg by mouth 2 (two) times daily.    . [employer] CALCIUM 600 MG tablet Take 1 tablet by mouth 2 (two) times daily.    Marland Kitchen ENTRESTO 24-26 MG Take 1 tablet by mouth 2 (two) times daily.     . furosemide (LASIX) 20 MG tablet Take 20 mg by mouth daily.    Marland Kitchen glipiZIDE (GLUCOTROL) 5 MG tablet Take 5 mg by mouth daily before breakfast.    . Multiple Vitamin (MULTIVITAMIN WITH MINERALS) TABS tablet Take 1 tablet by mouth daily. 90 tablet 1  . pantoprazole (PROTONIX) 40 MG tablet Take 1 tablet (40 mg total) by mouth daily. 90 tablet 0  . sodium chloride 1 g tablet Take 1 tablet (1 g total) by mouth 3 (three) times daily with meals. 21 tablet 0  . spironolactone (ALDACTONE) 25 MG tablet Take 12.5 mg by mouth daily.    . Thiamine HCl (VITAMIN B-1) 250 MG tablet Take 250 mg by mouth daily.    . vitamin B-12 (CYANOCOBALAMIN) 1000 MCG tablet Take 1,000 mcg by mouth daily.     No current facility-administered medications for this visit.   No Known Allergies   Review of Systems: All systems reviewed and negative except where noted in HPI.   Lab Results  Component Value Date   WBC 6.1 06/25/2020   HGB 13.4 06/25/2020   HCT 36.1 (L) 06/25/2020   MCV 93.5 06/25/2020   PLT 231 06/25/2020    Lab Results  Component Value Date   CREATININE 0.79 07/23/2020   BUN 19 07/23/2020   NA 135 07/23/2020   K 3.8 07/23/2020   CL 101 07/23/2020   CO2 28 07/23/2020    Lab Results  Component Value Date   ALT 23 06/22/2020   AST 20 06/22/2020   ALKPHOS 46 06/22/2020   BILITOT 1.0 06/22/2020     Physical Exam: BP 122/70   Pulse 88   Ht _0  (1.803 m)   Wt 118 lb (53.5 kg)   BMI 16.46 kg/m  Constitutional: Pleasant,well-developed, male in no acute distress. Abdominal: Soft, nondistended, nontender. Scaphoid abdomen, There are no masses palpable. No hepatomegaly. Extremities: no edema Lymphadenopathy: No cervical adenopathy noted. Neurological: Alert and oriented to person place and time. Skin: Skin is warm and dry. No rashes noted. Psychiatric: Normal mood and affect. Behavior is normal.   ASSESSMENT AND PLAN: 75 year old male here for reevaluation of the following  issues:  Loss of weight Abnormal CT scan chest Abnormal MRCP -pancreatic ductal dilation Macrocytic anemia Barrett's esophagus Colon cancer screening Diabetes with neuropathy  As above, patient has undergone an extensive work-up for weight loss and neuropathy.  Found to have newly diagnosed diabetes with severe neuropathy causing his problems with ambulation.  Since having  diabetes managed his appetite is much better and is eating better however he is not regaining weight, in fact losing a bit of weight.  He has undergone significant imaging, he has some pulmonary nodules in his chest on prior CT scan of his chest and he is due for a follow-up CT scan chest so we will order that.  I discussed the abnormal MRCP with him, he has a mild pancreatic ductal dilation without any mass lesions.  Patient and wife are concerned about this in light of his diabetes.  I will contact my advanced endoscopy colleagues to get their thoughts on possible EUS given his progressive symptoms although there was no overt mass lesion on his MRCP, if we do not pursue EUS sooner, would plan for an MRCP 6 months from his last exam.  We discussed his upper endoscopy findings, nothing there to cause his symptoms but he did have a short segment of Barrett's esophagus.  Taking Protonix daily at this time, no reflux symptoms, he understands at risk for esophageal cancer but hopefully the risk is overall quite low.  We discussed that he has never had a colonoscopy, for preventative purposes and to evaluate his ongoing weight loss I think a colonoscopy is reasonable.  His sodium levels are normal, I think he can now tolerate a bowel prep now that he is tolerating p.o. quite well.  That being said, his imaging looks okay and he has no bowel symptoms, I am not convinced we are going to find a source for his weight loss in his colon.  Wife has concerns about colonoscopy and whether he is ready for this, they want to schedule this but will do it  at the end of May or June to give him a bit more time to prepare.  We will get the CT scan chest done sooner and consider EUS in the interim as well.  Otherwise noted to have macrocytic anemia, B12, folate, TSH are normal.  We will repeat CBC today to see if this is progressing and consider hematology evaluation if so.  Wife and patient agreement with the plan as outlined  Plan: - continue protonix for Barrett's esophagus - CT scan chest with contrast - will speak with my advance endoscopy colleagues about possible EUS vs. follow up MRCP - CBC today, consideration for Hematology evaluation - Colonoscopy, to be scheduled in next 1-2 months  Further recommendations pending his course and results.  [place] Cellar, MD [facility]

## 2020-10-12 NOTE — Patient Instructions (Addendum)
If you are age 73 or older, your body mass index should be between 23-30. Your Body mass index is 16.46 kg/m. If this is out of the aforementioned range listed, please consider follow up with your Primary Care Provider.  If you are age 51 or younger, your body mass index should be between 19-25. Your Body mass index is 16.46 kg/m. If this is out of the aformentioned range listed, please consider follow up with your Primary Care Provider.   Please go to the lab in the basement of our building to have lab work done as you leave today. Hit "B" for basement when you get on the elevator.  When the doors open the lab is on your left.  We will call you with the results. Thank you.  Due to recent changes in healthcare laws, you may see the results of your imaging and laboratory studies on MyChart before your provider has had a chance to review them.  We understand that in some cases there may be results that are confusing or concerning to you. Not all laboratory results come back in the same time frame and the provider may be waiting for multiple results in order to interpret others.  Please give [name] 48 hours in order for your provider to thoroughly review all the results before contacting the office for clarification of your results.    _____________________________________________________________  Dennis Bast have been scheduled for a CT scan of the Chest at [facility] ([address]---this is in the same building as [employer]).   You are scheduled on Thursday, 4-14 at 11:30am. You should arrive 15 minutes prior to your appointment time for registration. Please follow the written instructions below on the day of your exam:  WARNING: IF YOU ARE ALLERGIC TO IODINE/X-RAY DYE, PLEASE NOTIFY RADIOLOGY IMMEDIATELY AT [phone]! YOU WILL BE GIVEN A 13 HOUR PREMEDICATION PREP.  1) Do not eat after 9:30am (2 hours prior to your test) 2) Please be sure to drink lots of liquids that morning     You may receive an intravenous injection of x-ray contrast/dye. Plan on being at [facility] for 30 minutes or longer, depending on the type of exam you are having performed.  This test typically takes 30-45 minutes to complete.  If you have any questions regarding your exam or if you need to reschedule, you may call the CT department at [phone] between the hours of 8:00 am and 5:00 pm, Monday-Friday.  _____________________________________________________________________  Dennis Bast have been scheduled for a colonoscopy. Please follow written instructions given to you at your visit today.  Please pick up your prep supplies at the pharmacy within the next 1-3 days. If you use inhalers (even only as needed), please bring them with you on the day of your procedure.   Continue Protonix.    Thank you for entrusting me with your care and for choosing [facility], Dr. Crete Cellar

## 2020-10-15 ENCOUNTER — Telehealth: Payer: Self-pay | Admitting: Gastroenterology

## 2020-10-15 DIAGNOSIS — R634 Abnormal weight loss: Secondary | ICD-10-CM

## 2020-10-15 NOTE — Telephone Encounter (Signed)
I called the patient after speaking with my advanced endoscopy colleagues about his case.  We feel EUS is a reasonable next step to rule out subtle pancreatic lesion and ensure no problems at the ampulla given his imaging findings and continued weight loss.  I discussed what EUS is with him and he is agreeable to proceed.  Patty can you please contact this patient for scheduling EUS with either GM or DJ?  Please refer to staff message note sent to you about this.  Thank you very much  Dr. Havery Moros

## 2020-10-16 ENCOUNTER — Telehealth: Payer: Self-pay

## 2020-10-16 ENCOUNTER — Other Ambulatory Visit: Payer: Self-pay

## 2020-10-16 DIAGNOSIS — K869 Disease of pancreas, unspecified: Secondary | ICD-10-CM

## 2020-10-16 NOTE — Telephone Encounter (Signed)
Sorry wrong patient please ignore

## 2020-10-16 NOTE — Telephone Encounter (Signed)
Is 5/26 at 1115 am ok? You are booked thru May.

## 2020-10-16 NOTE — Telephone Encounter (Signed)
The pt has been scheduled for EUS on 11/01/20 at 1030 am at [facility] with Dr Rush Landmark.  COVID test on 10/29/20 at 1025 am.    EUS scheduled, pt instructed and medications reviewed.  Patient instructions sent to the pt My Chart.  Patient to call with any questions or concerns.

## 2020-10-16 NOTE — Telephone Encounter (Signed)
Great thanks very much 

## 2020-10-16 NOTE — Telephone Encounter (Signed)
-----   Message from Yetta Flock, MD sent at 10/15/2020  5:56 PM EDT ----- Regarding: RE: question about MRCP Thanks Gabe, appreciate the follow up and your help.  I called the patient today, he wants to proceed with EUS.   Lynnann Knudsen can you help schedule the EUS? Thanks   ----- Message ----- From: Irving Copas., MD Sent: 10/15/2020   3:41 PM EDT To: Milus Banister, MD, Timothy Lasso, RN, # Subject: RE: question about MRCP                        SA, agree with need for further evaluation with side-viewing endoscope to rule out ampullary lesion and to rule out any subtle lesions in the pancreas. After you have a chance to talk with the patient if he is okay with it then please let us know and Chong Sicilian can work on scheduling next available EUS with DJ or myself. Thanks. GM ----- Message ----- From: Yetta Flock, MD Sent: 10/12/2020   1:14 PM EDT To: Milus Banister, MD, # Subject: question about MRCP                            Hey guys, Have a question about this patient.  He has profound weight loss without a clear cause, also diagnosed with newly diagnosed diabetes with severe neuropathy from that.  He has a pancreatic ductal dilation on CT and MRCP but no clear mass.  He has been unable to gain weight.  I was planning on doing another MRCP 6 months from his last exam, his pancreatic ductal dilation is not severe, but given his progressive symptoms was curious if we should consider an EUS sooner.  Thanks for your opinion.  Richardson Landry

## 2020-10-25 ENCOUNTER — Ambulatory Visit (INDEPENDENT_AMBULATORY_CARE_PROVIDER_SITE_OTHER)
Admission: RE | Admit: 2020-10-25 | Discharge: 2020-10-25 | Disposition: A | Discharge status: Home / Self Care | Payer: Medicare HMO | Source: Ambulatory Visit | Attending: Gastroenterology | Admitting: Gastroenterology

## 2020-10-25 ENCOUNTER — Other Ambulatory Visit: Payer: Self-pay

## 2020-10-25 DIAGNOSIS — R9389 Abnormal findings on diagnostic imaging of other specified body structures: Secondary | ICD-10-CM

## 2020-10-25 DIAGNOSIS — D539 Nutritional anemia, unspecified: Secondary | ICD-10-CM

## 2020-10-25 DIAGNOSIS — E1149 Type 2 diabetes mellitus with other diabetic neurological complication: Secondary | ICD-10-CM

## 2020-10-25 DIAGNOSIS — R634 Abnormal weight loss: Secondary | ICD-10-CM

## 2020-10-25 DIAGNOSIS — I2510 Atherosclerotic heart disease of native coronary artery without angina pectoris: Secondary | ICD-10-CM | POA: Diagnosis not present

## 2020-10-25 DIAGNOSIS — Z1211 Encounter for screening for malignant neoplasm of colon: Secondary | ICD-10-CM

## 2020-10-25 DIAGNOSIS — K2270 Barrett's esophagus without dysplasia: Secondary | ICD-10-CM

## 2020-10-25 DIAGNOSIS — K449 Diaphragmatic hernia without obstruction or gangrene: Secondary | ICD-10-CM | POA: Diagnosis not present

## 2020-10-25 DIAGNOSIS — I700 Atherosclerosis of aorta: Secondary | ICD-10-CM | POA: Diagnosis not present

## 2020-10-25 DIAGNOSIS — R933 Abnormal findings on diagnostic imaging of other parts of digestive tract: Secondary | ICD-10-CM

## 2020-10-25 DIAGNOSIS — J984 Other disorders of lung: Secondary | ICD-10-CM | POA: Diagnosis not present

## 2020-10-25 MED ORDER — IOHEXOL 300 MG/ML  SOLN
80.0000 mL | Freq: Once | INTRAMUSCULAR | Status: AC | PRN
  Administered 2020-10-25: 70 mL via INTRAVENOUS

## 2020-10-29 ENCOUNTER — Other Ambulatory Visit (HOSPITAL_COMMUNITY)
Admission: RE | Admit: 2020-10-29 | Discharge: 2020-10-29 | Disposition: A | Discharge status: Home / Self Care | Payer: Medicare HMO | Source: Ambulatory Visit | Attending: Gastroenterology | Admitting: Gastroenterology

## 2020-10-29 DIAGNOSIS — Z20822 Contact with and (suspected) exposure to covid-19: Secondary | ICD-10-CM | POA: Insufficient documentation

## 2020-10-29 DIAGNOSIS — Z01812 Encounter for preprocedural laboratory examination: Secondary | ICD-10-CM | POA: Insufficient documentation

## 2020-10-29 LAB — SARS CORONAVIRUS 2 (TAT 6-24 HRS): SARS Coronavirus 2: NEGATIVE

## 2020-10-31 ENCOUNTER — Other Ambulatory Visit: Payer: Self-pay

## 2020-10-31 ENCOUNTER — Encounter (HOSPITAL_COMMUNITY): Payer: Self-pay | Admitting: Gastroenterology

## 2020-10-31 NOTE — Progress Notes (Signed)
Anesthesia Chart Review: Same day workup  Patient follows with cardiologist Dr. Brigitte Pulse at [facility] for history of cardiomyopathy.  Last seen 07/03/2020.  Per note, "patient is a friendly 75 year old gentleman who maintains a very active lifestyle despite having experienced a marked cardiomyopathy.  His left ventricular function appears to be regional involving the entire anterior wall with sparing of the lateral wall (overall left ventricular systolic function EF 37% to 35%).  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  He was noted by echo also to have severe diastolic dysfunction with left ventricular end-diastolic pressure elevation and modifications in his medicines were made accordingly.  His lisinopril was discontinued and replaced with Entresto and given his resting tachycardia of 106 bpm he was started on carvedilol 6.25 mg twice daily and spironolactone 25 mg once a day and supplemental potassium 10 mEq a day will be discontinued.  Following with neurologist Dr. 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  Per notes, starting around June 2021 he began having tingling in the feetwhich progressed into his lower legs. At the same time, he developed imbalance which steadily progressed where he needed to use a cane and started using a walker in October 2021. Subsequently, his left foot started to drag and he has been unable to move his toes. He was reportedly falling several times per week, but was able to get up by himself. No low back pain, bowel/bladder issues. He denies weakness in the face and hands.  He has been participating in physical therapy.He was last seen 08/24/2020.  Per note, "He has been compliant with PT and reports having some improvement in  balance and was able to walk with a cane a short distance for the first time. 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  Per note his diabetes was under much better control with A1c of 6.0 on 09/11/2020, and his glipizide was reduced to 5 mg daily.    Recent chest CT showed large hiatal hernia.   Patient will need day of surgery labs and evaluation.  EKG 01/25/2020 (copy on chart): Sinus tachycardia.  Rate 107.  Left atrial enlargement.  Old anterior infarct.  Diffuse nonspecific T abnormality.  CT Chest 10/25/20: IMPRESSION: 1. Stable biapical pleural and parenchymal scarring changes and apical nodularity. No discrete apical mass. Recommend follow-up noncontrast chest CT in 6-12 months. 2. No mediastinal or hilar mass or adenopathy. 3. Stable large hiatal hernia. 4. Stable atherosclerotic calcifications involving the aorta and coronary arteries. 5. Aortic atherosclerosis.  TTE 01/25/2020 (copy on chart): Conclusions: 1.  Sinus rhythm. 2.  This was a technically good study. 3.  There is severe global hypokinesis of LV contractility. 4.  Overall left ventricular systolic function is moderately impaired with an EF between 35% and 40%. 5.  Restrictive LV filling pattern, consistent with elevated LA pressure. 6.  Left atrium is moderately dilated (46 mL/m). 7.  The right ventricle is moderately enlarged measuring between 3.8-4.1 cm. 8.  The right ventricular systolic function is moderately impaired. 9.  There is mild aortic valve sclerosis without stenosis. 10.  Trace amount of aortic regurgitation. 11.  The mitral valve was not well visualized. 12.  Predominantly centrally directed jet. 13.  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 14.  There is no pericardial effusion.  There is a large 8.9 cm pleural effusion. 15.  Aortic root, ascending aorta and aortic arch are normal. 16.  Findings  consistent with cardiomyopathy resulting in pleural effusion, global systolic dysfunction and annular dilation causing valvular regurgitation. 16.  Consider cardiology consult.   Wynonia Musty [facility] Short Stay Center/Anesthesiology Phone [phone] 10/31/2020 3:02 PM

## 2020-10-31 NOTE — Anesthesia Preprocedure Evaluation (Addendum)
Anesthesia Evaluation  Patient identified by MRN, date of birth, ID band Patient awake    Reviewed: Allergy & Precautions, H&P , NPO status , Patient's Chart, lab work & pertinent test results, reviewed documented beta blocker date and time   Airway Mallampati: I  TM Distance: >3 FB Neck ROM: full    Dental no notable dental hx. (+) Poor Dentition, Chipped, Missing, Loose, Dental Advisory Given,    Pulmonary neg pulmonary ROS,    Pulmonary exam normal breath sounds clear to auscultation       Cardiovascular Exercise Tolerance: Good + CAD, + Past MI and +CHF   Rhythm:regular Rate:Normal     Neuro/Psych  Neuromuscular disease CVA    GI/Hepatic hiatal hernia,   Endo/Other  diabetes, Type 2  Renal/GU      Musculoskeletal   Abdominal   Peds  Hematology negative hematology ROS (+)   Anesthesia Other Findings   Reproductive/Obstetrics                           Anesthesia Physical Anesthesia Plan  ASA: IV  Anesthesia Plan: MAC   Post-op Pain Management:    Induction:   PONV Risk Score and Plan: 1 and Treatment may vary due to age or medical condition  Airway Management Planned: Nasal Cannula, Natural Airway and Simple Face Mask  Additional Equipment:   Intra-op Plan:   Post-operative Plan:   Informed Consent: I have reviewed the patients History and Physical, chart, labs and discussed the procedure including the risks, benefits and alternatives for the proposed anesthesia with the patient or authorized representative who has indicated his/her understanding and acceptance.     Dental Advisory Given  Plan Discussed with: CRNA and Anesthesiologist  Anesthesia Plan Comments: (PAT note by Karoline Caldwell, PA-C:  Patient follows with cardiologist Dr. Brigitte Pulse at [facility] for history of cardiomyopathy.  Last seen 07/03/2020.  Per note, "patient is a friendly 75 year old gentleman  who maintains a very active lifestyle despite having experienced a marked cardiomyopathy.  His left ventricular function appears to be regional involving the entire anterior wall with sparing of the lateral wall (overall left ventricular systolic function EF 38% to 35%).  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  He was noted by echo also to have severe diastolic dysfunction with left ventricular end-diastolic pressure elevation and modifications in his medicines were made accordingly.  His lisinopril was discontinued and replaced with Entresto and given his resting tachycardia of 106 bpm he was started on carvedilol 6.25 mg twice daily and spironolactone 25 mg once a day and supplemental potassium 10 mEq a day will be discontinued.  Following with neurologist Dr. 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  Per notes, starting around June 2021 he began having tingling in the feetwhich progressed into his lower legs. At the same time, he developed imbalance which steadily progressed where he needed to use a cane and started using a walker in October 2021. Subsequently, his left foot started to drag and he has been unable to move his toes. He was reportedly falling several times per week, but was able to get up by himself. No low back pain, bowel/bladder issues. He denies weakness in the face and hands.  He has been participating in physical therapy.He  was last seen 08/24/2020.  Per note, "He has been compliant with PT and reports having some improvement in balance and was able to walk with a cane a short distance for the first time. 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  Per note his diabetes was  under much better control with A1c of 6.0 on 09/11/2020, and his glipizide was reduced to 5 mg daily.    Recent chest CT showed large hiatal hernia.   Patient will need day of surgery labs and evaluation.  EKG 01/25/2020 (copy on chart): Sinus tachycardia.  Rate 107.  Left atrial enlargement.  Old anterior infarct.  Diffuse nonspecific T abnormality.  CT Chest 10/25/20: IMPRESSION: 1. Stable biapical pleural and parenchymal scarring changes and apical nodularity. No discrete apical mass. Recommend follow-up noncontrast chest CT in 6-12 months. 2. No mediastinal or hilar mass or adenopathy. 3. Stable large hiatal hernia. 4. Stable atherosclerotic calcifications involving the aorta and coronary arteries. 5. Aortic atherosclerosis.  TTE 01/25/2020 (copy on chart): Conclusions: 1.  Sinus rhythm. 2.  This was a technically good study. 3.  There is severe global hypokinesis of LV contractility. 4.  Overall left ventricular systolic function is moderately impaired with an EF between 35% and 40%. 5.  Restrictive LV filling pattern, consistent with elevated LA pressure. 6.  Left atrium is moderately dilated (46 mL/m). 7.  The right ventricle is moderately enlarged measuring between 3.8-4.1 cm. 8.  The right ventricular systolic function is moderately impaired. 9.  There is mild aortic valve sclerosis without stenosis. 10.  Trace amount of aortic regurgitation. 11.  The mitral valve was not well visualized. 12.  Predominantly centrally directed jet. 13.  There is moderate pulmonary hypertension (47 mmHg). 14.  There is no pericardial effusion.  There is a large 8.9 cm pleural effusion. 15.  Aortic root, ascending aorta and aortic arch are normal. 16.  Findings consistent with cardiomyopathy resulting in pleural effusion, global systolic dysfunction and annular dilation causing valvular regurgitation. 16.  Consider cardiology consult.  )       Anesthesia Quick Evaluation

## 2020-10-31 NOTE — Progress Notes (Signed)
Patient denies shortness of breath, fever, cough or chest pain.  PCP - Lars Mage, NP Cardiologist - Dr Brigitte Pulse  Chest x-ray - CT 10/25/20 EKG - 06/22/20 Stress Test - n/a ECHO - n/a Cardiac Cath - n/a  Anesthesia review: Yes  STOP now taking any Aspirin (unless otherwise instructed by your surgeon), Aleve, Naproxen, Ibuprofen, Motrin, Advil, Goody's, BC's, all herbal medications, fish oil, and all vitamins.   Coronavirus Screening Covid test on 10/29/20 was negative.  Patient verbalized understanding of instructions that were given via phone.

## 2020-11-01 ENCOUNTER — Ambulatory Visit (HOSPITAL_COMMUNITY): Payer: Medicare HMO | Admitting: Physician Assistant

## 2020-11-01 ENCOUNTER — Ambulatory Visit (HOSPITAL_COMMUNITY)
Admission: RE | Admit: 2020-11-01 | Discharge: 2020-11-01 | Disposition: A | Discharge status: Home / Self Care | Payer: Medicare HMO | Attending: Gastroenterology | Admitting: Gastroenterology

## 2020-11-01 ENCOUNTER — Other Ambulatory Visit: Payer: Self-pay

## 2020-11-01 ENCOUNTER — Encounter (HOSPITAL_COMMUNITY)
Admission: RE | Disposition: A | Discharge status: Home / Self Care | Payer: Self-pay | Source: Home / Self Care | Attending: Gastroenterology

## 2020-11-01 ENCOUNTER — Encounter (HOSPITAL_COMMUNITY): Payer: Self-pay | Admitting: Gastroenterology

## 2020-11-01 DIAGNOSIS — Z681 Body mass index (BMI) 19 or less, adult: Secondary | ICD-10-CM | POA: Insufficient documentation

## 2020-11-01 DIAGNOSIS — K8689 Other specified diseases of pancreas: Secondary | ICD-10-CM | POA: Insufficient documentation

## 2020-11-01 DIAGNOSIS — K3189 Other diseases of stomach and duodenum: Secondary | ICD-10-CM | POA: Diagnosis not present

## 2020-11-01 DIAGNOSIS — Z8673 Personal history of transient ischemic attack (TIA), and cerebral infarction without residual deficits: Secondary | ICD-10-CM | POA: Insufficient documentation

## 2020-11-01 DIAGNOSIS — R634 Abnormal weight loss: Secondary | ICD-10-CM | POA: Insufficient documentation

## 2020-11-01 DIAGNOSIS — K869 Disease of pancreas, unspecified: Secondary | ICD-10-CM

## 2020-11-01 DIAGNOSIS — M21372 Foot drop, left foot: Secondary | ICD-10-CM | POA: Insufficient documentation

## 2020-11-01 DIAGNOSIS — I252 Old myocardial infarction: Secondary | ICD-10-CM | POA: Diagnosis not present

## 2020-11-01 DIAGNOSIS — K2970 Gastritis, unspecified, without bleeding: Secondary | ICD-10-CM | POA: Diagnosis not present

## 2020-11-01 DIAGNOSIS — K838 Other specified diseases of biliary tract: Secondary | ICD-10-CM

## 2020-11-01 DIAGNOSIS — I2510 Atherosclerotic heart disease of native coronary artery without angina pectoris: Secondary | ICD-10-CM | POA: Diagnosis not present

## 2020-11-01 DIAGNOSIS — R918 Other nonspecific abnormal finding of lung field: Secondary | ICD-10-CM | POA: Insufficient documentation

## 2020-11-01 DIAGNOSIS — Z79899 Other long term (current) drug therapy: Secondary | ICD-10-CM | POA: Insufficient documentation

## 2020-11-01 DIAGNOSIS — I429 Cardiomyopathy, unspecified: Secondary | ICD-10-CM | POA: Insufficient documentation

## 2020-11-01 DIAGNOSIS — K449 Diaphragmatic hernia without obstruction or gangrene: Secondary | ICD-10-CM | POA: Diagnosis not present

## 2020-11-01 DIAGNOSIS — D539 Nutritional anemia, unspecified: Secondary | ICD-10-CM | POA: Diagnosis not present

## 2020-11-01 DIAGNOSIS — Z7984 Long term (current) use of oral hypoglycemic drugs: Secondary | ICD-10-CM | POA: Diagnosis not present

## 2020-11-01 DIAGNOSIS — I509 Heart failure, unspecified: Secondary | ICD-10-CM | POA: Diagnosis not present

## 2020-11-01 DIAGNOSIS — K209 Esophagitis, unspecified without bleeding: Secondary | ICD-10-CM | POA: Diagnosis not present

## 2020-11-01 DIAGNOSIS — E1142 Type 2 diabetes mellitus with diabetic polyneuropathy: Secondary | ICD-10-CM | POA: Diagnosis not present

## 2020-11-01 DIAGNOSIS — K208 Other esophagitis without bleeding: Secondary | ICD-10-CM | POA: Diagnosis not present

## 2020-11-01 HISTORY — PX: EUS: SHX5427

## 2020-11-01 HISTORY — PX: ESOPHAGOGASTRODUODENOSCOPY (EGD) WITH PROPOFOL: SHX5813

## 2020-11-01 HISTORY — PX: BIOPSY: SHX5522

## 2020-11-01 SURGERY — ESOPHAGOGASTRODUODENOSCOPY (EGD) WITH PROPOFOL
Anesthesia: Monitor Anesthesia Care

## 2020-11-01 MED ORDER — KETAMINE HCL 10 MG/ML IJ SOLN
INTRAMUSCULAR | Status: DC | PRN
  Administered 2020-11-01 (×5): 5 mg via INTRAVENOUS

## 2020-11-01 MED ORDER — SODIUM CHLORIDE 0.9 % IV SOLN: INTRAVENOUS | Status: DC

## 2020-11-01 MED ORDER — PHENYLEPHRINE 40 MCG/ML (10ML) SYRINGE FOR IV PUSH (FOR BLOOD PRESSURE SUPPORT)
PREFILLED_SYRINGE | INTRAVENOUS | Status: DC | PRN
  Administered 2020-11-01 (×2): 40 ug via INTRAVENOUS
  Administered 2020-11-01: 80 ug via INTRAVENOUS
  Administered 2020-11-01: 40 ug via INTRAVENOUS
  Administered 2020-11-01: 80 ug via INTRAVENOUS
  Administered 2020-11-01 (×2): 40 ug via INTRAVENOUS

## 2020-11-01 MED ORDER — LIDOCAINE HCL URETHRAL/MUCOSAL 2 % EX GEL
CUTANEOUS | Status: DC | PRN
  Administered 2020-11-01: 1 via TOPICAL

## 2020-11-01 MED ORDER — GLYCOPYRROLATE 0.2 MG/ML IJ SOLN
INTRAMUSCULAR | Status: DC | PRN
  Administered 2020-11-01 (×2): .1 mg via INTRAVENOUS

## 2020-11-01 MED ORDER — LACTATED RINGERS IV SOLN: INTRAVENOUS | Status: DC

## 2020-11-01 MED ORDER — PROPOFOL 10 MG/ML IV BOLUS
INTRAVENOUS | Status: DC | PRN
  Administered 2020-11-01: 20 mg via INTRAVENOUS

## 2020-11-01 MED ORDER — DEXMEDETOMIDINE (PRECEDEX) IN NS 20 MCG/5ML (4 MCG/ML) IV SYRINGE
PREFILLED_SYRINGE | INTRAVENOUS | Status: DC | PRN
  Administered 2020-11-01 (×3): 4 ug via INTRAVENOUS

## 2020-11-01 MED ORDER — LIDOCAINE VISCOUS HCL 2 % MT SOLN
OROMUCOSAL | Status: AC
  Filled 2020-11-01: qty 15

## 2020-11-01 MED ORDER — PANTOPRAZOLE SODIUM 40 MG PO TBEC: 40.0000 mg | DELAYED_RELEASE_TABLET | Freq: Every day | ORAL | 5 refills | Status: AC

## 2020-11-01 MED ORDER — PROPOFOL 500 MG/50ML IV EMUL
INTRAVENOUS | Status: DC | PRN
  Administered 2020-11-01: 75 ug/kg/min via INTRAVENOUS

## 2020-11-01 MED ORDER — FENTANYL CITRATE (PF) 100 MCG/2ML IJ SOLN
INTRAMUSCULAR | Status: DC | PRN
  Administered 2020-11-01 (×2): 25 ug via INTRAVENOUS

## 2020-11-01 SURGICAL SUPPLY — 14 items

## 2020-11-01 NOTE — Op Note (Signed)
[facility] Patient Name: Aaron Rush Procedure Date : 11/01/2020 MRN: [id number] Attending MD: Justice Britain , MD Date of Birth: 04/14/1946 CSN: [id number] Age: 75 Admit Type: Outpatient Procedure:                Upper EUS Indications:              Dilated pancreatic duct on CT scan, Dilated                            pancreatic duct on MRCP, Weight loss (now                            stabilized and improving), Recent diagnosis of DM                            (though well controlled currently) Providers:                Justice Britain, MD, Elspeth Cho Tech.,                            Technician Referring MD:             Carlota Raspberry. Havery Moros, MD, Bernerd Limbo Medicines:                Monitored Anesthesia Care Complications:            No immediate complications. Estimated Blood Loss:     Estimated blood loss was minimal. Procedure:                Pre-Anesthesia Assessment:                           - Prior to the procedure, a History and Physical                            was performed, and patient medications and                            allergies were reviewed. The patient's tolerance of                            previous anesthesia was also reviewed. The risks                            and benefits of the procedure and the sedation                            options and risks were discussed with the patient.                            All questions were answered, and informed consent                            was obtained. Prior Anticoagulants: The patient has  taken no previous anticoagulant or antiplatelet                            agents. ASA Grade Assessment: III - A patient with                            severe systemic disease. After reviewing the risks                            and benefits, the patient was deemed in                            satisfactory condition to undergo the procedure.                            After obtaining informed consent, the endoscope was                            passed under direct vision. Throughout the                            procedure, the patient's blood pressure, pulse, and                            oxygen saturations were monitored continuously. The                            [id number]-H190 ([id number]) Olympus gastroscope was                            introduced through the mouth, and advanced to the                            second part of duodenum. The [id number]-Q180V ([id number])                            Olympus Duodenoscope was introduced through the                            mouth, and advanced to the second part of duodenum.                            The [id number] ([id number]) Olympus Linear EUS scope                            was introduced through the mouth, and advanced to                            the duodenum for ultrasound examination from the                            stomach and duodenum. The upper EUS was  accomplished without difficulty. The patient                            tolerated the procedure. Scope In: Scope Out: Findings:      ENDOSCOPIC FINDING: :      No gross lesions were noted in the proximal esophagus and in the mid       esophagus.      One tongue of salmon-colored mucosa was present from 36 to 37 cm. No       other visible abnormalities were present. Biopsies were taken with a       cold forceps for histology.      The Z-line was irregular and was found 37 cm from the incisors.      A large, 6 cm hiatal hernia was present.      Patchy mild inflammation characterized by erosions, friability and       granularity was found in the gastric body and in the gastric antrum.      No other gross lesions were noted in the entire examined stomach.       Biopsies were taken with a cold forceps since this seems more than       previous description from last EGD for histology and Helicobacter pylori        testing.      No gross lesions were noted in the duodenal bulb, in the first portion       of the duodenum and in the second portion of the duodenum.      The ampulla was normal.      ENDOSONOGRAPHIC FINDING: :      Pancreatic parenchymal abnormalities were noted in the entire pancreas.       These consisted of atrophy, lobularity with honeycombing, hyperechoic       foci without shadowing and hyperechoic strands.      The pancreatic duct had a dilated endosonographic appearance in the       pancreatic head, genu of the pancreas and body of the pancreas. The       pancreatic duct in the head measured 3.2 mm -> 5.1 mm. The pancreatic       duct in the neck measured 6.0 mm -> 4.8 mm -> 4.4 mm. The pancreatic       duct in the body measured 2.4 mm -> 2.3 mm. The pancreatic duct in the       tail measured 2.5 mm.      The pancreatic duct had an ansa contoured loop in the genu/body       transition of the pancreas.      Endosonographic imaging in the entire pancreas showed no mass       throughout, though sensitivity in setting of chronic pancreatitis       changes is decreased.      There was no sign of significant endosonographic abnormality in the       common bile duct (4.6 mm)and in the common hepatic duct. No stones and       ducts of normal caliber were identified.      A small amount of hyperechoic material consistent with sludge was       visualized endosonographically in the gallbladder. A small possible       non-shadowing stone vs polyp <3 mm is size was noted on today's       examination (it did not move upon  manipulation of the abdomen).      Endosonographic imaging of the ampulla showed no extrinsic compression,       intramural (subepithelial) lesion, mass, varices or wall thickening.      Endosonographic imaging in the visualized portion of the liver showed no       mass.      No malignant-appearing lymph nodes were visualized in the celiac region       (level 20),  peripancreatic region and porta hepatis region.      The celiac region was visualized. Impression:               EGD Impression:                           - No gross lesions in esophagus proximally.                           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 Biopsied.                           - Z-line irregular, 37 cm from the incisors.                           - Large hiatal hernia.                           - Gastritis in antrum/body. Otherwise, no gross                            lesions in the stomach. Biopsied.                           - No gross lesions in the duodenal bulb, in the                            first portion of the duodenum and in the second                            portion of the duodenum.                           - Normal ampulla.                           EUS Impression:                           - Pancreatic parenchymal abnormalities consisting                            of atrophy, lobularity with honeycombing,                            hyperechoic foci without shadowing and hyperechoic  strands were noted in the entire pancreas.                           - The pancreatic duct had a dilated endosonographic                            appearance in the pancreatic head, genu of the                            pancreas and body of the pancreas.                           - The pancreatic duct had an irregularly contoured                            ansa loop in the genu/body region of the pancreas.                           - No evidence of pancreatic mass noted throughout                            the examination - though with chronic pancreatitis                            changes, sensitivity of EUS decreases for                            mass/lesions.                           - There was no sign of significant pathology in the                            common bile duct  and in the common hepatic duct.                           - Hyperechoic material consistent with sludge was                            visualized endosonographically in the gallbladder                            and a possible non-shadowing stone vs polyp (<3 mm                            in size) noted.                           - No malignant-appearing lymph nodes were                            visualized in the celiac region (level 20),  peripancreatic region and porta hepatis region. Recommendation:           - The patient will be observed post-procedure,                            until all discharge criteria are met.                           - Discharge patient to home.                           - Patient has a contact number available for                            emergencies. The signs and symptoms of potential                            delayed complications were discussed with the                            patient. Return to normal activities tomorrow.                            Written discharge instructions were provided to the                            patient.                           - Resume previous diet.                           - Increase Protein supplementation with shakes 1-3                            times daily.                           - Based on [place] criteria he has 1 Major B and                            at least 3 Minor criteria. This is suggestive of a                            diagnosis of Chronic Pancreatitis. Recommend                            consideration of Pancreatic Enzyme Replacement                            Therapy in future at least for a trial to see if                            this may be of some benefit to patient (72K with  each mean and 36K with each snack). Recommend                            obtaining Fecal elastase as well.                           - Observe patient's  clinical course.                           - Increase Protonix back to 40 mg daily for now.                           - Await path results.                           - Recommend repeat imaging Pancreas Protocol CT                            Abdomen v MRI/MRCP Abdomen in 4-month to follow up                            pancreatic dilitation, as long as patient is not                            having progressive symptoms developing of worsening                            DM control or Unintentional weight loss. May                            require additional workup including Colonoscopy for                            unintentional weight loss should this recur as well.                           - The findings and recommendations were discussed                            with the patient.                           - The findings and recommendations were discussed                            with the patient's family. Procedure Code(s):        --- Professional ---                           [phone] Esophagogastroduodenoscopy, flexible,                            transoral; with endoscopic ultrasound examination  limited to the esophagus, stomach or duodenum, and                            adjacent structures                           43239, Esophagogastroduodenoscopy, flexible,                            transoral; with biopsy, single or multiple Diagnosis Code(s):        --- Professional ---                           K22.8, Other specified diseases of esophagus                           K44.9, Diaphragmatic hernia without obstruction or                            gangrene                           K29.70, Gastritis, unspecified, without bleeding                           K86.9, Disease of pancreas, unspecified                           I89.9, Noninfective disorder of lymphatic vessels                            and lymph nodes, unspecified                            K86.89, Other specified diseases of pancreas                           R63.4, Abnormal weight loss                           R93.3, Abnormal findings on diagnostic imaging of                            other parts of digestive tract                           K83.8, Other specified diseases of biliary tract CPT copyright 2019 American Medical Association. All rights reserved. The codes documented in this report are preliminary and upon coder review may  be revised to meet current compliance requirements. Justice Britain, MD 11/01/2020 12:10:03 PM Number of Addenda: 0

## 2020-11-01 NOTE — Transfer of Care (Signed)
Immediate Anesthesia Transfer of Care Note  Patient: Aaron Rush  Procedure(s) Performed: ESOPHAGOGASTRODUODENOSCOPY (EGD) WITH PROPOFOL (N/A ) UPPER ENDOSCOPIC ULTRASOUND (EUS) RADIAL (N/A ) BIOPSY  Patient Location: PACU  Anesthesia Type:General  Level of Consciousness: drowsy and patient cooperative  Airway & Oxygen Therapy: Patient Spontanous Breathing and Patient connected to face mask oxygen  Post-op Assessment: Report given to RN and Post -op Vital signs reviewed and stable  Post vital signs: Reviewed and stable  Last Vitals:  Vitals Value Taken Time  BP 103/59 11/01/20 1155  Temp    Pulse 70 11/01/20 1158  Resp 13 11/01/20 1158  SpO2 100 % 11/01/20 1158  Vitals shown include unvalidated device data.  Last Pain:  Vitals:   11/01/20 0946  TempSrc: Oral  PainSc: 0-No pain         Complications: No complications documented.

## 2020-11-01 NOTE — Interval H&P Note (Signed)
History and Physical Interval Note:  11/01/2020 10:25 AM  Aaron Rush  has presented today for surgery, with the diagnosis of pancreatic lesion.  The various methods of treatment have been discussed with the patient and family. After consideration of risks, benefits and other options for treatment, the patient has consented to  Procedure(s): ESOPHAGOGASTRODUODENOSCOPY (EGD) WITH PROPOFOL (N/A) UPPER ENDOSCOPIC ULTRASOUND (EUS) RADIAL (N/A) as a surgical intervention.  The patient's history has been reviewed, patient examined, no change in status, stable for surgery.  I have reviewed the patient's chart and labs.  Questions were answered to the patient's satisfaction.    The risks of an EUS including intestinal perforation, bleeding, infection, aspiration, and medication effects were discussed as was the possibility it may not give a definitive diagnosis if a biopsy is performed.  When a biopsy of the pancreas is done as part of the EUS, there is an additional risk of pancreatitis at the rate of about 1-2%.  It was explained that procedure related pancreatitis is typically mild, although it can be severe and even life threatening, which is why we do not perform random pancreatic biopsies and only biopsy a lesion/area we feel is concerning enough to warrant the risk.   [employer]

## 2020-11-02 ENCOUNTER — Encounter (HOSPITAL_COMMUNITY): Payer: Self-pay | Admitting: Gastroenterology

## 2020-11-02 ENCOUNTER — Other Ambulatory Visit: Payer: Self-pay

## 2020-11-02 ENCOUNTER — Encounter: Payer: Self-pay | Admitting: Gastroenterology

## 2020-11-02 DIAGNOSIS — R933 Abnormal findings on diagnostic imaging of other parts of digestive tract: Secondary | ICD-10-CM

## 2020-11-02 DIAGNOSIS — K869 Disease of pancreas, unspecified: Secondary | ICD-10-CM

## 2020-11-02 DIAGNOSIS — R634 Abnormal weight loss: Secondary | ICD-10-CM

## 2020-11-02 LAB — SURGICAL PATHOLOGY

## 2020-11-05 ENCOUNTER — Other Ambulatory Visit: Payer: Medicare HMO

## 2020-11-05 ENCOUNTER — Telehealth: Payer: Self-pay | Admitting: Gastroenterology

## 2020-11-05 MED ORDER — PANCRELIPASE (LIP-PROT-AMYL) 36000-114000 UNITS PO CPEP: ORAL_CAPSULE | ORAL | 1 refills | Status: DC

## 2020-11-05 NOTE — Telephone Encounter (Signed)
Inbound call from patient's sister requesting a call please.  States patient received a message from pharmacy saying medication will be over &600 but sister is unsure what medication it is and is wanting to know if something more affordable can be sent.  Please advise.

## 2020-11-06 ENCOUNTER — Other Ambulatory Visit: Payer: Medicare HMO

## 2020-11-06 DIAGNOSIS — K869 Disease of pancreas, unspecified: Secondary | ICD-10-CM

## 2020-11-06 DIAGNOSIS — I6930 Unspecified sequelae of cerebral infarction: Secondary | ICD-10-CM | POA: Diagnosis not present

## 2020-11-06 DIAGNOSIS — I5022 Chronic systolic (congestive) heart failure: Secondary | ICD-10-CM | POA: Diagnosis not present

## 2020-11-06 DIAGNOSIS — R918 Other nonspecific abnormal finding of lung field: Secondary | ICD-10-CM | POA: Diagnosis not present

## 2020-11-06 DIAGNOSIS — R933 Abnormal findings on diagnostic imaging of other parts of digestive tract: Secondary | ICD-10-CM | POA: Diagnosis not present

## 2020-11-06 DIAGNOSIS — M21372 Foot drop, left foot: Secondary | ICD-10-CM | POA: Diagnosis not present

## 2020-11-06 DIAGNOSIS — R634 Abnormal weight loss: Secondary | ICD-10-CM | POA: Diagnosis not present

## 2020-11-06 DIAGNOSIS — E1142 Type 2 diabetes mellitus with diabetic polyneuropathy: Secondary | ICD-10-CM | POA: Diagnosis not present

## 2020-11-06 DIAGNOSIS — E871 Hypo-osmolality and hyponatremia: Secondary | ICD-10-CM | POA: Diagnosis not present

## 2020-11-06 DIAGNOSIS — E119 Type 2 diabetes mellitus without complications: Secondary | ICD-10-CM | POA: Diagnosis not present

## 2020-11-06 DIAGNOSIS — R269 Unspecified abnormalities of gait and mobility: Secondary | ICD-10-CM | POA: Diagnosis not present

## 2020-11-06 DIAGNOSIS — K219 Gastro-esophageal reflux disease without esophagitis: Secondary | ICD-10-CM | POA: Diagnosis not present

## 2020-11-06 MED ORDER — PANCRELIPASE (LIP-PROT-AMYL) 36000-114000 UNITS PO CPEP: ORAL_CAPSULE | ORAL | 1 refills | Status: DC

## 2020-11-06 NOTE — Telephone Encounter (Signed)
Script sent to [facility].

## 2020-11-06 NOTE — Telephone Encounter (Signed)
Called and spoke to sister. Creon will cost him $675 for 280 tablets (his Copay amount). Insurance with [employer].Will enroll patient in the [facility] program and resend script to [facility] to see if they can get it covered better

## 2020-11-07 ENCOUNTER — Telehealth: Payer: Self-pay | Admitting: Gastroenterology

## 2020-11-07 NOTE — Telephone Encounter (Signed)
Inbound call from sister,  addressing concerns that the patient have about current medications. States he will not begin taking medicine until he have [phone]

## 2020-11-07 NOTE — Telephone Encounter (Signed)
Inbound call from sister,  Aaron Rush, addressing concerns that the patient have about current medications. States he will not begin taking medicine until he have a discussion in person with someone. I have scheduled an appointment for 5/12 but sister insist can't wait till the 12th. Call sister @  [phone]

## 2020-11-07 NOTE — Telephone Encounter (Signed)
Spoke with patient's sister in regards to concerns. She states that patient has several questions regarding the Creon medication and wanted to discuss in person with someone. Aaron Rush states that someone told the patient that he should have already started the medication but that was contradictory to what he was told previosuly. Advised that the patient was right, he was asked to leave the stool sample prior to starting the medication. Patient left specimen yesterday, sister is aware that we will contact them in regards to the results. Patient just wants to be sure this is something that he actually needs because the medication is quite expensive. His sister has expressed that the medication is necessary and will help him feel better but patient would like to hear that from the doctor. Patient has been scheduled to discuss concerns with Dr. Havery Moros on 11/22/20 at 1:20 PM. Sister states that an [facility] representative has already contacted them in regards to assistance for Creon. Answered all of Aaron Rush's questions, she verbalized understanding of all information and had no concerns at the end of the call.

## 2020-11-07 NOTE — Telephone Encounter (Signed)
Thank you [place] I agree with your recommendations.

## 2020-11-11 NOTE — Anesthesia Postprocedure Evaluation (Signed)
Anesthesia Post Note  Patient: Aaron Rush  Procedure(s) Performed: ESOPHAGOGASTRODUODENOSCOPY (EGD) WITH PROPOFOL (N/A ) UPPER ENDOSCOPIC ULTRASOUND (EUS) RADIAL (N/A ) BIOPSY     Patient location during evaluation: PACU Anesthesia Type: MAC Level of consciousness: awake and alert Pain management: pain level controlled Vital Signs Assessment: post-procedure vital signs reviewed and stable Respiratory status: spontaneous breathing, nonlabored ventilation, respiratory function stable and patient connected to nasal cannula oxygen Cardiovascular status: stable and blood pressure returned to baseline Postop Assessment: no apparent nausea or vomiting Anesthetic complications: no   No complications documented.  Last Vitals:  Vitals:   11/01/20 1210 11/01/20 1220  BP: 119/72 129/68  Pulse: 70 72  Resp: 13 13  Temp:    SpO2: 100% 100%    Last Pain:  Vitals:   11/01/20 1220  TempSrc:   PainSc: 0-No pain                 Naidelyn Parrella

## 2020-11-12 ENCOUNTER — Other Ambulatory Visit: Payer: Self-pay

## 2020-11-12 MED ORDER — ZENPEP 40000-126000 UNITS PO CPEP: ORAL_CAPSULE | ORAL | 2 refills | Status: DC

## 2020-11-12 NOTE — Progress Notes (Unsigned)
Zenpep sent to [facility]

## 2020-11-12 NOTE — Telephone Encounter (Signed)
Yes let's try Zenpep and see if cheaper for the patient, should be same dosing. Thanks

## 2020-11-12 NOTE — Addendum Note (Signed)
Addended by: Roetta Sessions on: 11/12/2020 02:21 PM   Modules accepted: Orders

## 2020-11-12 NOTE — Telephone Encounter (Signed)
Called and spoke to Aaron Rush.  Patient would need to pay $675 for 1 month supply. She indicated that patient may want to try to apply for patient assistance through [facility].  But first we may want to try ZenPep. 40000 units? Please advise

## 2020-11-13 NOTE — Telephone Encounter (Signed)
Called [employer]. ZenPep would be over $400. Therefore, I completed Patient Assistance application for Creon for the provider sections. Called and spoke to patient. He understands that I am mailing him the application that he has to complete and mail or fax to [employer].

## 2020-11-14 LAB — PANCREATIC ELASTASE, FECAL: Pancreatic Elastase-1, Stool: 426 mcg/g

## 2020-11-15 DIAGNOSIS — M6281 Muscle weakness (generalized): Secondary | ICD-10-CM | POA: Diagnosis not present

## 2020-11-15 DIAGNOSIS — R2681 Unsteadiness on feet: Secondary | ICD-10-CM | POA: Diagnosis not present

## 2020-11-15 DIAGNOSIS — R2689 Other abnormalities of gait and mobility: Secondary | ICD-10-CM | POA: Diagnosis not present

## 2020-11-16 ENCOUNTER — Other Ambulatory Visit: Payer: Self-pay

## 2020-11-19 DIAGNOSIS — R2681 Unsteadiness on feet: Secondary | ICD-10-CM | POA: Diagnosis not present

## 2020-11-19 DIAGNOSIS — R2689 Other abnormalities of gait and mobility: Secondary | ICD-10-CM | POA: Diagnosis not present

## 2020-11-19 DIAGNOSIS — M6281 Muscle weakness (generalized): Secondary | ICD-10-CM | POA: Diagnosis not present

## 2020-11-21 DIAGNOSIS — R2689 Other abnormalities of gait and mobility: Secondary | ICD-10-CM | POA: Diagnosis not present

## 2020-11-21 DIAGNOSIS — R2681 Unsteadiness on feet: Secondary | ICD-10-CM | POA: Diagnosis not present

## 2020-11-21 DIAGNOSIS — M6281 Muscle weakness (generalized): Secondary | ICD-10-CM | POA: Diagnosis not present

## 2020-11-21 NOTE — Progress Notes (Signed)
Follow-up Visit   Date: 11/22/20   Aaron Rush MRN: [id number] DOB: November 04, 1945   Interim History: Aaron Rush is a 75 y.o. right-handed Caucasian male with cardiomyopathy, hypertension, CAD returning to the clinic for follow-up of sensorimotor polyneuropathy.  The patient was accompanied to the clinic by sister Aaron Rush) who also provides collateral information.    History of present illness: Starting around June 2021, he began having tingling in the feet which progressed into his lower legs.  The same time, he developed imbalance which steadily progressed where he needed to use a cane and has been using a walker since October.  Over the past two month, his left foot started to drag and he has been unable to move his toes.  He is falling several times per week, but is able to get up by himself. No low back pain, bowel/bladder issues. He denies weakness in the face and hands.     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  He lives in a 2-level home by himself. No children.    UPDATE 06/15/2020:   Since his visit, he was diagnosed with diabetes HbA1c ~ 9 and started on metformin.  Patient's sister is concerned because they did not get diabetes education and would like resources.  He also has CSF testing which shows markedly elevated CSF protein and glucose.  He has been going to out-pt PT and feels that the muscle tone in the legs is improving.  No change in the severity of his left leg weakness.   UPDATE 08/24/2020: He has been compliant with PT and reports having some improvement in balance and was able to walk with a cane a short distance for the first time. He continues to have left foot drop and wears and AFO.  His diabetes is better managed with HbA1c 7.7.    UPDATE 11/21/2020:  He is here for follow-up visit. He has been very compliant with doing physical therapy  and has noticed slight improvement in the left foot, such that he can appreciate some movement and he feels that he is able to raise the leg higher than before.   In fact, he has been starting to learn to use a cane at PT.  No new weakness, numbness/tingling or falls.  Despite eating well, his weight has not come up. He is undergoing malignancy evaluation.    Medications:  Current Outpatient Medications on File Prior to Visit  Medication Sig Dispense Refill  . carvedilol (COREG) 3.125 MG tablet Take 3.125 mg by mouth 2 (two) times daily.    . [employer] CALCIUM 600 MG tablet Take 600 mg by mouth 2 (two) times daily.    Marland Kitchen ENTRESTO 24-26 MG Take 1 tablet by mouth 2 (two) times daily.    . furosemide (LASIX) 20 MG tablet Take 20 mg by mouth daily.    Marland Kitchen glipiZIDE (GLUCOTROL) 5 MG tablet Take 5 mg by mouth daily before breakfast.    . Multiple Vitamin (MULTIVITAMIN WITH MINERALS) TABS tablet Take 1 tablet by mouth daily. 90 tablet 1  . pantoprazole (PROTONIX) 40 MG tablet Take 1 tablet (40 mg total) by mouth daily. 30 tablet 5  . sodium chloride 1 g tablet TAKE 1 TABLET (1 G TOTAL) BY MOUTH THREE TIMES DAILY WITH MEALS. (Patient taking differently: Take 1 g by mouth 3 (three)  times daily with meals.) 21 tablet 0  . spironolactone (ALDACTONE) 25 MG tablet Take 12.5 mg by mouth daily.    . Thiamine HCl (VITAMIN B-1) 250 MG tablet Take 250 mg by mouth daily.    . vitamin B-12 (CYANOCOBALAMIN) 1000 MCG tablet Take 1,000 mcg by mouth daily.    . vitamin C (ASCORBIC ACID) 500 MG tablet Take 500 mg by mouth daily.     No current facility-administered medications on file prior to visit.    Allergies: No Known Allergies  Vital Signs:  BP 112/68   Pulse 78   Ht 5\' 11"  (1.803 m)   Wt 116 lb (52.6 kg)   SpO2 98%   BMI 16.18 kg/m    Neurological Exam: MENTAL STATUS including orientation to time, place, person, recent and remote memory, attention span and concentration, language, and fund of knowledge is  normal.  Speech is not dysarthric.  CRANIAL NERVES:  Pupils equal round and reactive to light.  Normal conjugate, extra-ocular eye movements in all directions of gaze.  No ptosis   MOTOR:  Generalized severe loss of muscle bulk throughout, especially in the legs where there is atrophy.  No fasciculations or abnormal movements.  No pronator drift.   Upper Extremity:  Right  Left  Deltoid  5/5   5/5   Biceps  5/5   5/5   Triceps  5/5   5/5   Infraspinatus 5/5  5/5  Medial pectoralis 5/5  5/5  Wrist extensors  5/5   5/5   Wrist flexors  5/5   5/5   Finger extensors  5/5   5/5   Finger flexors  5/5   5/5   Dorsal interossei  5/5   5/5   Abductor pollicis  5/5   5/5   Tone (Ashworth scale)  0  0   Lower Extremity:  Right  Left  Hip flexors  4+/5   3/5   Hip extensors  4+/5   3/5   Adductor 5-/5  4+/5  Abductor 5-/5  4+/5  Knee flexors  4+/5   4/5   Knee extensors  5-/5   4/5   Dorsiflexors  4+/5   1/5   Plantarflexors  4+/5   2/5   Toe extensors  4/5   1/5   Toe flexors  4/5   2/5   Tone (Ashworth scale)  0  0   MSRs:  Reflexes are 2+/4 throughout in the arms and diffusely absent in the legs. .  SENSORY:  Absent temperature and vibration distal to lower legs bilaterally, worse on the left.  He is able to perceive some trace vibration in the right foot today.     COORDINATION/GAIT: Gait is assisted with walker and left AFO, appears stable.    Data: NCS/EMG of the legs 06/15/2020: The electrophysiologic findings are consistent with an asymmetric, subacute sensorimotor polyradiculoneuropathy affecting the lower extremities.  Overall, these findings are severe in degree electrically.  CSF 05/31/2020:  CSF R3 W0 G106** P124**  IgG index 0.49, ACE, Lyme, cytology, OCB -neg  Lab Results  Component Value Date   HGBA1C 7.7 (H) 06/22/2020    IMPRESSION/PLAN: Diabetic polyradiculoneuropathy - severe.  He has asymmetric leg weakness with left flail foot, distal sensory loss, and  arreflexia with CSF and EMG consistent with polyradiculoneuropathy.  Exam is shows mild improvement in the left foot, such that he is able to plantar flex, which is new.  I encouraged him to continue PT and home  exercises.  His diabetes is very well-controlled now, with last HbA1c 5.5 He is compliant with wearing AFO If there is worsening of symptoms, consider sending paraneoplastic panel Patient's sister had many questions which were addressed.   Return to clinic in 6 months  Total time spent reviewing records, interview, history/exam, documentation, and coordination of care on day of encounter:  30 min      Thank you for allowing me to participate in patient's care.  If I can answer any additional questions, I would be pleased to do so.    Sincerely,    Sacred Roa K. Posey Pronto, DO

## 2020-11-22 ENCOUNTER — Other Ambulatory Visit: Payer: Self-pay

## 2020-11-22 ENCOUNTER — Ambulatory Visit: Payer: Medicare HMO | Admitting: Gastroenterology

## 2020-11-22 ENCOUNTER — Ambulatory Visit: Payer: Medicare HMO | Admitting: Neurology

## 2020-11-22 ENCOUNTER — Encounter: Payer: Self-pay | Admitting: Neurology

## 2020-11-22 ENCOUNTER — Encounter: Payer: Self-pay | Admitting: Gastroenterology

## 2020-11-22 ENCOUNTER — Ambulatory Visit: Payer: Medicare HMO | Admitting: Physician Assistant

## 2020-11-22 ENCOUNTER — Other Ambulatory Visit (INDEPENDENT_AMBULATORY_CARE_PROVIDER_SITE_OTHER): Payer: Medicare HMO

## 2020-11-22 VITALS — BP 100/60 | HR 92 | Ht 71.0 in | Wt 116.6 lb

## 2020-11-22 VITALS — BP 112/68 | HR 78 | Ht 71.0 in | Wt 116.0 lb

## 2020-11-22 DIAGNOSIS — R634 Abnormal weight loss: Secondary | ICD-10-CM

## 2020-11-22 DIAGNOSIS — R5383 Other fatigue: Secondary | ICD-10-CM

## 2020-11-22 DIAGNOSIS — D649 Anemia, unspecified: Secondary | ICD-10-CM | POA: Diagnosis not present

## 2020-11-22 DIAGNOSIS — E1142 Type 2 diabetes mellitus with diabetic polyneuropathy: Secondary | ICD-10-CM

## 2020-11-22 DIAGNOSIS — K861 Other chronic pancreatitis: Secondary | ICD-10-CM | POA: Diagnosis not present

## 2020-11-22 LAB — IBC + FERRITIN
Ferritin: 279.3 ng/mL (ref 22.0–322.0)
Iron: 125 ug/dL (ref 42–165)
Saturation Ratios: 35.6 % (ref 20.0–50.0)
Transferrin: 251 mg/dL (ref 212.0–360.0)

## 2020-11-22 NOTE — Patient Instructions (Signed)
Return to clinic in 6 months.

## 2020-11-22 NOTE — Patient Instructions (Addendum)
If you are age 73 or older, your body mass index should be between 23-30. Your Body mass index is 16.26 kg/m. If this is out of the aforementioned range listed, please consider follow up with your Primary Care Provider.  If you are age 27 or younger, your body mass index should be between 19-25. Your Body mass index is 16.26 kg/m. If this is out of the aformentioned range listed, please consider follow up with your Primary Care Provider.   We are giving you samples of Creon today (exp: 12-22-21 and LOT: [id number])  and understand you are waiting to hear regarding patient assistance.  Please go to the lab in the basement of our building to have lab work done as you leave today. Hit "B" for basement when you get on the elevator.  When the doors open the lab is on your left.  We will call you with the results. Thank you.  Due to recent changes in healthcare laws, you may see the results of your imaging and laboratory studies on MyChart before your provider has had a chance to review them.  We understand that in some cases there may be results that are confusing or concerning to you. Not all laboratory results come back in the same time frame and the provider may be waiting for multiple results in order to interpret others.  Please give [name] 48 hours in order for your provider to thoroughly review all the results before contacting the office for clarification of your results.   You will be due for MRCP in October 2022.  You will be due for CT of chest in October 2022.  You are scheduled for your colonoscopy on 12-12-2020 at 2:30pm to arrive at 1:30pm.  Thank you for entrusting me with your care and for choosing [facility], Dr. Milton Cellar

## 2020-11-22 NOTE — Progress Notes (Signed)
HPI :  75 year old male here for follow-up visit for weight loss, possible chronic pancreatitis.  Recall that he went to the ED in early November 2021 due to progressive lower extremity weakness and looking to get a Neurology evaluation. During that time he endorsed 30 lbs of weight loss, poor appetite. He had extensive imaging at that visitin the ER to include CT scan of the head, CT scan of the chest abdomen pelvis, CTA of the neck with and without contrast. 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 He had very mild dilation of the main pancreatic duct to 4 to 5 mm in diameter. No pancreatic mass lesions. Some apical pulmonary nodules, small angiomyolipoma of the left kidney. He was empirically placed on Protonix 20mg . He follows chronically with cardiology, he has a history of CAD and CHF, on Entresto.  He was extensively worked up by neurology and found to have new onset diabetes with a hemoglobin A1c of 9.6.  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  Since he has been on therapy for this his blood sugars have been much better controlled.  He is working with physical therapy regarding his ambulatory status.  During his course he was admitted in December with hyponatremia and was placed on salt tablet 3 times daily which he continues.  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  He was found to have a UTI during that time.  He has had a work-up with me including upper endoscopy to clarify CT findings this past January. 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  Otherwise no other concerning findings.  Gastric biopsies negative for H. pylori.  He also had an MRCP to further clarify pancreatic findings, this was done in February which showed  persistent mild dilation of the main pancreatic duct without any mass lesions.  At our last visit he had not been able to regain any weight and had lost a few more pounds.  Recall that he has never had a colonoscopy and had not been able to do that yet.  He is scheduled for colonoscopy next month.  I had spoken with my advanced endoscopy colleagues and we proceeded with EUS with Dr. Rush Landmark.  Full report as below.  Findings were suggestive of chronic pancreatitis.  He completed a fecal elastase which was 426, normal.  We had discussed placing him on empiric pancreatic enzymes to see if that would help him at all given the EUS findings.  He denies any symptoms of stearrhea.  His bowels are fairly regular.  He has no epigastric pain.  He is eating okay, states he is eating 3 meals a day and using boost/Ensure protein shakes twice daily.  He states his energy is low and his stamina is down.  On Protonix he is not having any reflux symptoms.  He thinks his weight is generally stable since of last seen him.  His neuropathy he thinks is improving and is trying to ambulate better, trying to use a cane.  He has had significant alcohol use in the past but has not drank since 1981.  Him and his wife have several questions about the pancreatic enzymes and plans moving forward.  He also had a follow-up CT scan of the chest since our last visit  as below.  Of note he does have a normocytic anemia that has persisted with negative lab eval so far.  Prior workup: CT abdomen / pelvis 05/17/20 -IMPRESSION: 1. Large hiatal hernia. Mild circumferential distal esophageal thickening, may represent esophagitis. 2. Potential fat containing lesion in the LEFT kidney may represent a small angiomyolipoma. Consider follow-up noncontrast CT on a nonemergent basis for complete characterization. 3. Mild dilation of the main pancreatic duct in the head of the pancreas with smooth tapering distally, correlate with current evidence  of or prior history of pancreatitis. No peripancreatic stranding on today's study.4-50mm main dilation of PD 4. Apical pulmonary nodules in the setting of pleural and parenchymal scarring are of uncertain significance. Largest approximately 8 mm. Non-contrast chest CT at 3-6 months is recommended. If the nodules are stable at time of repeat CT, then future CT at 18-24 months (from today's scan) is considered optional for low-risk patients, but is recommended for high-risk patients. This recommendation follows the consensus statement: Guidelines for Management of Incidental Pulmonary Nodules Detected on CT Images: From the Fleischner Society 2017; Radiology 2017; 284:228-243. 5. Normal appendix. 6. Calcified coronary artery disease. 7. Aortic atherosclerosis.  Aortic Atherosclerosis (ICD10-I70.0).  CT head - 05/17/20 -IMPRESSION: Small infarcts within the bilateral cerebellar hemispheres are favored subacute or chronic given the degree of hypodensity. Brain MRI may be obtained for confirmation, as clinically warranted.  Otherwise unremarkable non-contrast CT appearance of the brain for Age.   EGD 07/27/20 -  - A 7 cm hiatal hernia was present. - The Z-line was irregular with a 10-20mm tongue of salmon colored mucosa without nodularity. Biopsies were taken with a cold forceps for histology. - The examined esophagus was tortuous. - The exam of the esophagus was otherwise normal. - The entire examined stomach was normal other than some very mild focal areas of erythema. No ulcers or mass lesions. Biopsies were taken with a cold forceps for Helicobacter pylori testing. - The duodenal bulb and second portion of the duodenum were normal.  1. Surgical [P], gastric antrum and gastric body - MILD REACTIVE GASTROPATHY. [place] IS NEGATIVE FOR HELICOBACTER PYLORI. - NO INTESTINAL METAPLASIA, DYSPLASIA, OR MALIGNANCY. 2. Surgical [P], esophagus, GE junction - INTESTINAL  METAPLASIA CONSISTENT WITH BARRETT'S ESOPHAGUS. - NO DYSPLASIA OR MALIGNANCY.    MRCP 08/15/20 - IMPRESSION: 1. No acute findings within the abdomen. 2. Unchanged mild dilatation of the main pancreatic duct at the level of the head of pancreas. No mass identified. Recommend follow-up imaging in 6-12 months to ensure ongoing stability. 3. Bilateral kidney cysts. There is a septated cyst within the inferior pole of the right kidney. 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 Attention to this lesion on follow-up imaging is advised. 4. Small hiatal hernia. 5. Aortic Atherosclerosis (ICD10-I70.0).   CT chest 10/26/20 - IMPRESSION: 1. Stable biapical pleural and parenchymal scarring changes and apical nodularity. No discrete apical mass. Recommend follow-up noncontrast chest CT in 6-12 months. 2. No mediastinal or hilar mass or adenopathy. 3. Stable large hiatal hernia. 4. Stable atherosclerotic calcifications involving the aorta and coronary arteries. 5. Aortic atherosclerosis.    EUS 11/01/20:EGD Impression: - No gross lesions in esophagus proximally. - Salmon-colored mucosa suspicious for Barrett's esophagus distally. Biopsied. - Z-line irregular, 37 cm from the incisors. - Large hiatal hernia. - Gastritis in antrum/body. Otherwise, no gross lesions in the stomach. Biopsied. - No gross lesions in the duodenal bulb, in the first portion of the duodenum and in the second portion  of the duodenum. - Normal ampulla. EUS Impression: - Pancreatic parenchymal abnormalities consisting of atrophy, lobularity with honeycombing, hyperechoic foci without shadowing and hyperechoic strands were noted in the entire pancreas. - The pancreatic duct had a dilated endosonographic appearance in the pancreatic head, genu of the pancreas and body of the pancreas. - The pancreatic duct had an irregularly contoured ansa loop in the genu/body region of the pancreas. - No evidence of  pancreatic mass noted throughout the examination - though with chronic pancreatitis changes, sensitivity of EUS decreases for mass/lesions. - There was no sign of significant pathology in the common bile duct and in the common hepatic duct. - Hyperechoic material consistent with sludge was visualized endosonographically in the gallbladder and a possible non-shadowing stone vs polyp (<3 mm in size) noted. - No malignant-appearing lymph nodes were visualized in the celiac region (level 20), peripancreatic region and porta hepatis region.    Past Medical History:  Diagnosis Date  . Aortic atherosclerosis ([place])   . Barrett's esophagus   . CAD (coronary artery disease)   . Cardiomyopathy ([place])   . CHF (congestive heart failure) ([place])   . Diabetes ([place])    type 2  . Diverticulosis   . Hiatal hernia   . Hyponatremia   . Myocardial infarction ([place])   . Neuropathy    feet  . 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 BIOPSY  11/01/2020   Procedure: BIOPSY;  Surgeon: Rush Landmark Telford Nab., MD;  Location: [facility] ENDOSCOPY;  Service: Endoscopy;;  . ESOPHAGOGASTRODUODENOSCOPY    . ESOPHAGOGASTRODUODENOSCOPY (EGD) WITH PROPOFOL N/A 11/01/2020   Procedure: ESOPHAGOGASTRODUODENOSCOPY (EGD) WITH PROPOFOL;  Surgeon: Rush Landmark Telford Nab., MD;  Location: [facility];  Service: Endoscopy;  Laterality: N/A;  . EUS N/A 11/01/2020   Procedure: UPPER ENDOSCOPIC ULTRASOUND (EUS) RADIAL;  Surgeon: Rush Landmark Telford Nab., MD;  Location: [facility];  Service: Endoscopy;  Laterality: N/A;  . HERNIA REPAIR     umbilical in the 44'H   Family History  Problem Relation Age of Onset  . COPD Father   . Colon cancer Neg Hx   . Esophageal cancer Neg Hx   . Stomach cancer Neg Hx   . Rectal cancer Neg Hx    Social History   Tobacco Use  . Smoking status: Never Smoker  . Smokeless tobacco: Never Used  Vaping Use  . Vaping Use:  Never used  Substance Use Topics  . Alcohol use: Never    Comment: stopped 12/15/1979  . Drug use: Never   Current Outpatient Medications  Medication Sig Dispense Refill  . carvedilol (COREG) 3.125 MG tablet Take 3.125 mg by mouth 2 (two) times daily.    . [employer] CALCIUM 600 MG tablet Take 600 mg by mouth 2 (two) times daily.    Marland Kitchen ENTRESTO 24-26 MG Take 1 tablet by mouth 2 (two) times daily.    . furosemide (LASIX) 20 MG tablet Take 20 mg by mouth daily.    Marland Kitchen glipiZIDE (GLUCOTROL) 5 MG tablet Take 5 mg by mouth daily before breakfast.    . Multiple Vitamin (MULTIVITAMIN WITH MINERALS) TABS tablet Take 1 tablet by mouth daily. 90 tablet 1  . pantoprazole (PROTONIX) 40 MG tablet Take 1 tablet (40 mg total) by mouth daily. 30 tablet 5  . sodium chloride 1 g tablet TAKE 1 TABLET (1 G TOTAL) BY MOUTH THREE TIMES DAILY WITH MEALS. (Patient taking  differently: Take 1 g by mouth 3 (three) times daily with meals.) 21 tablet 0  . spironolactone (ALDACTONE) 25 MG tablet Take 12.5 mg by mouth daily.    . Thiamine HCl (VITAMIN B-1) 250 MG tablet Take 250 mg by mouth daily.    . vitamin B-12 (CYANOCOBALAMIN) 1000 MCG tablet Take 1,000 mcg by mouth daily.    . vitamin C (ASCORBIC ACID) 500 MG tablet Take 500 mg by mouth daily.     No current facility-administered medications for this visit.   No Known Allergies   Review of Systems: All systems reviewed and negative except where noted in HPI.    CT CHEST W CONTRAST  Result Date: 10/26/2020 CLINICAL DATA:  Followup pulmonary nodules. EXAM: CT CHEST WITH CONTRAST TECHNIQUE: Multidetector CT imaging of the chest was performed during intravenous contrast administration. CONTRAST:  64mL OMNIPAQUE IOHEXOL 300 MG/ML  SOLN COMPARISON:  CT scan 05/17/2020 FINDINGS: Cardiovascular: The heart is normal in size. No pericardial effusion. There is moderate tortuosity of the thoracic aorta with scattered atherosclerotic calcifications. Stable coronary artery  calcifications. Mediastinum/Nodes: No mediastinal or hilar mass or lymphadenopathy. Stable large hiatal hernia the thyroid gland is unremarkable. Lungs/Pleura: No acute pulmonary findings. No pleural effusions or pleural lesions. There are stable biapical pleural and parenchymal scarring changes and apical nodularity. No discrete apical mass. No worrisome lesions are identified in the remainder of the lungs. Upper Abdomen: No significant upper abdominal findings. Scattered vascular calcifications. There are small left renal calculi noted and mild renal cortical scarring changes. Musculoskeletal: No significant bony findings. IMPRESSION: 1. Stable biapical pleural and parenchymal scarring changes and apical nodularity. No discrete apical mass. Recommend follow-up noncontrast chest CT in 6-12 months. 2. No mediastinal or hilar mass or adenopathy. 3. Stable large hiatal hernia. 4. Stable atherosclerotic calcifications involving the aorta and coronary arteries. 5. Aortic atherosclerosis. Aortic Atherosclerosis (ICD10-I70.0). Electronically Signed   By: Marijo Sanes M.D.   On: 10/26/2020 15:18   Lab Results  Component Value Date   WBC 6.8 10/12/2020   HGB 12.9 (L) 10/12/2020   HCT 37.0 (L) 10/12/2020   MCV 98.7 10/12/2020   PLT 215.0 10/12/2020    Lab Results  Component Value Date   CREATININE 0.69 10/12/2020   BUN 25 (H) 10/12/2020   NA 139 10/12/2020   K 3.5 10/12/2020   CL 103 10/12/2020   CO2 29 10/12/2020    Lab Results  Component Value Date   ALT 23 06/22/2020   AST 20 06/22/2020   ALKPHOS 46 06/22/2020   BILITOT 1.0 06/22/2020   Lab Results  Component Value Date   LIPASE 54 (H) 06/21/2020      Physical Exam: BP 100/60   Ht 5\' 11"  (1.803 m)   Wt 116 lb 9.6 oz (52.9 kg)   BMI 16.26 kg/m  Constitutional: Pleasant,well-developed, male in no acute distress. Neurological: Alert and oriented to person place and time. Psychiatric: Normal mood and affect. Behavior is  normal.   ASSESSMENT AND PLAN: 75 year old male here for reevaluation of the following issues:  Loss of weight Fatigue Anemia Chronic pancreatitis Barrett's esophagus Colon cancer screening Diabetes with neuropathy  As above, patient has undergone an extensive work-up for weight loss and neuropathy.  Found to have newly diagnosed diabetes with severe neuropathy causing his problems with ambulation.  Since having diabetes managed his appetite is much better and is eating better however he is not regaining weight.  He has had imaging as above, and had a repeat  CT scan of his chest with some stable biapical changes, needs a follow-up CT scan 6 months for that.  He has had an EUS with Dr. Rush Landmark since her last visit which was suggestive of chronic pancreatitis.  That being said he has no abdominal pain, has no diarrhea or steatorrhea.  His fecal elastase was normal.  We discussed his history of alcohol use which could put him at risk for this, he has been abstinent for several years.  We discussed if he wanted to have a trial of pancreatic enzymes to see if that would help anything, I am not convinced it will given his stool test and lack of symptoms, but was able to get him some samples of pancreatic enzymes to try.  He has significant fatigue and a normocytic anemia.  I will check his testosterone level to make sure okay, we will also check his iron studies.  May consider hematology evaluation pending his course.  I do not see any concerning findings in his colon on his CT scan but he has never had a colonoscopy, he is scheduled for that in June.  Otherwise we will continue PPI for his Barrett's esophagus.  Recommend he eat 3 meals a day and continue to take nutritional supplements as snacks as he tolerates.  Of note he does have a renal cyst noted on prior imaging and needs that followed up.  I encouraged him to talk with his primary care about follow-up recommendations from the renal cyst.  They  agreed with the plan.  Plan: - trial of pancreatic enzymes as above - testosterone level / iron studies - colonoscopy scheduled for 6/1 - eat 3 meals per day, supplement with Ensure / high calorie snacks - continue protonix for Barrett's esophagus - repeat CT chest October - repeat MRCP in October - consider hematology evaluation pending labs - patient will discuss surveillance interval for renal cyst with PCP  Further recommendations pending his course and results.  Ovando Cellar, MD [facility]

## 2020-11-23 ENCOUNTER — Ambulatory Visit: Payer: Medicare HMO | Admitting: Neurology

## 2020-11-25 LAB — TESTOSTERONE,FREE AND TOTAL
Testosterone, Free: 3.7 pg/mL — ABNORMAL LOW (ref 6.6–18.1)
Testosterone: 316 ng/dL (ref 264–916)

## 2020-11-27 ENCOUNTER — Telehealth: Payer: Self-pay | Admitting: Physician Assistant

## 2020-11-27 ENCOUNTER — Other Ambulatory Visit: Payer: Self-pay

## 2020-11-27 DIAGNOSIS — D649 Anemia, unspecified: Secondary | ICD-10-CM

## 2020-11-27 DIAGNOSIS — R2689 Other abnormalities of gait and mobility: Secondary | ICD-10-CM | POA: Diagnosis not present

## 2020-11-27 DIAGNOSIS — R2681 Unsteadiness on feet: Secondary | ICD-10-CM | POA: Diagnosis not present

## 2020-11-27 DIAGNOSIS — M6281 Muscle weakness (generalized): Secondary | ICD-10-CM | POA: Diagnosis not present

## 2020-11-27 NOTE — Telephone Encounter (Signed)
Received a new hem referral from Dr. Havery Moros for anemia. [name] has been cld and scheduled to see Murray Hodgkins on 5/23 at 2pm. Pt aware to arrive 20 minutes early.

## 2020-11-28 DIAGNOSIS — R2681 Unsteadiness on feet: Secondary | ICD-10-CM | POA: Diagnosis not present

## 2020-11-28 DIAGNOSIS — M6281 Muscle weakness (generalized): Secondary | ICD-10-CM | POA: Diagnosis not present

## 2020-11-28 DIAGNOSIS — R2689 Other abnormalities of gait and mobility: Secondary | ICD-10-CM | POA: Diagnosis not present

## 2020-12-03 ENCOUNTER — Inpatient Hospital Stay: Payer: Medicare HMO

## 2020-12-03 ENCOUNTER — Inpatient Hospital Stay: Payer: Medicare HMO | Attending: Physician Assistant | Admitting: Physician Assistant

## 2020-12-03 ENCOUNTER — Encounter: Payer: Self-pay | Admitting: Physician Assistant

## 2020-12-03 ENCOUNTER — Other Ambulatory Visit: Payer: Self-pay

## 2020-12-03 VITALS — BP 125/66 | HR 84 | Temp 97.5°F | Resp 18 | Ht 71.0 in | Wt 119.0 lb

## 2020-12-03 DIAGNOSIS — I509 Heart failure, unspecified: Secondary | ICD-10-CM | POA: Insufficient documentation

## 2020-12-03 DIAGNOSIS — Z7984 Long term (current) use of oral hypoglycemic drugs: Secondary | ICD-10-CM | POA: Insufficient documentation

## 2020-12-03 DIAGNOSIS — D539 Nutritional anemia, unspecified: Secondary | ICD-10-CM | POA: Diagnosis not present

## 2020-12-03 DIAGNOSIS — I252 Old myocardial infarction: Secondary | ICD-10-CM | POA: Diagnosis not present

## 2020-12-03 DIAGNOSIS — Z8673 Personal history of transient ischemic attack (TIA), and cerebral infarction without residual deficits: Secondary | ICD-10-CM | POA: Diagnosis not present

## 2020-12-03 DIAGNOSIS — E119 Type 2 diabetes mellitus without complications: Secondary | ICD-10-CM | POA: Diagnosis not present

## 2020-12-03 DIAGNOSIS — Z79899 Other long term (current) drug therapy: Secondary | ICD-10-CM | POA: Insufficient documentation

## 2020-12-03 LAB — CMP (CANCER CENTER ONLY)
ALT: 19 U/L (ref 0–44)
AST: 17 U/L (ref 15–41)
Albumin: 4 g/dL (ref 3.5–5.0)
Alkaline Phosphatase: 62 U/L (ref 38–126)
Anion gap: 9 (ref 5–15)
BUN: 20 mg/dL (ref 8–23)
CO2: 28 mmol/L (ref 22–32)
Calcium: 9.6 mg/dL (ref 8.9–10.3)
Chloride: 104 mmol/L (ref 98–111)
Creatinine: 0.8 mg/dL (ref 0.61–1.24)
GFR, Estimated: >60 mL/min (ref >60)
Glucose, Bld: 125 mg/dL — ABNORMAL HIGH (ref 70–99)
Potassium: 3.2 mmol/L — ABNORMAL LOW (ref 3.5–5.1)
Sodium: 141 mmol/L (ref 135–145)
Total Bilirubin: 0.7 mg/dL (ref 0.3–1.2)
Total Protein: 7.1 g/dL (ref 6.5–8.1)

## 2020-12-03 LAB — CBC WITH DIFFERENTIAL (CANCER CENTER ONLY)
Abs Immature Granulocytes: 0.01 10*3/uL (ref 0.00–0.07)
Basophils Absolute: 0.1 10*3/uL (ref 0.0–0.1)
Basophils Relative: 1 %
Eosinophils Absolute: 0.2 10*3/uL (ref 0.0–0.5)
Eosinophils Relative: 3 %
HCT: 33.5 % — ABNORMAL LOW (ref 39.0–52.0)
Hemoglobin: 11.8 g/dL — ABNORMAL LOW (ref 13.0–17.0)
Immature Granulocytes: 0 %
Lymphocytes Relative: 27 %
Lymphs Abs: 1.7 10*3/uL (ref 0.7–4.0)
MCH: 34.2 pg — ABNORMAL HIGH (ref 26.0–34.0)
MCHC: 35.2 g/dL (ref 30.0–36.0)
MCV: 97.1 fL (ref 80.0–100.0)
Monocytes Absolute: 0.6 10*3/uL (ref 0.1–1.0)
Monocytes Relative: 9 %
Neutro Abs: 4 10*3/uL (ref 1.7–7.7)
Neutrophils Relative %: 60 %
Platelet Count: 180 10*3/uL (ref 150–400)
RBC: 3.45 MIL/uL — ABNORMAL LOW (ref 4.22–5.81)
RDW: 13 % (ref 11.5–15.5)
WBC Count: 6.6 10*3/uL (ref 4.0–10.5)
nRBC: 0 % (ref 0.0–0.2)

## 2020-12-03 LAB — RETIC PANEL
Immature Retic Fract: 10 % (ref 2.3–15.9)
RBC.: 3.45 MIL/uL — ABNORMAL LOW (ref 4.22–5.81)
Retic Count, Absolute: 31.1 10*3/uL (ref 19.0–186.0)
Retic Ct Pct: 0.9 % (ref 0.4–3.1)
Reticulocyte Hemoglobin: 36.8 pg (ref >27.9)

## 2020-12-03 LAB — LACTATE DEHYDROGENASE: LDH: 146 U/L (ref 98–192)

## 2020-12-03 LAB — VITAMIN B12: Vitamin B-12: 1414 pg/mL — ABNORMAL HIGH (ref 180–914)

## 2020-12-03 LAB — FOLATE: Folate: 39 ng/mL (ref >5.9)

## 2020-12-03 NOTE — Progress Notes (Signed)
[place] Telephone:(336) [phone]   Fax:(336) [place] NOTE  Patient Care Team: Bernerd Limbo, MD as PCP - General (Family Medicine) Alda Berthold, DO as Consulting Physician (Neurology)  Hematological/Oncological History 1) Labs from PCP, Bridget Hartshorn NP -07/03/2020: WBC 6.1, Hgb 12.6, MCV 101.4, Plt 343 -09/11/2020: WBC 7.1, Hgb 12.6, MCV 101.9, Plt 286 -11/06/2020:WBC 5.7, Hgb 11.8 , MCV 98.0, Plt 232  2) 11/01/2020: EUS/EGD: No gross lesions. Salmon-colored mucosa present from 36-37 cm, Large 6 cm hiatal hernia present, gastritis in antrum/body. Pancreatic parenchymal abnormalities consisting of atrophy, lobularity with honeycombing, hyperechoic foci without shadowing and hyperechoic strands were noted in the entire pancreas.  3) 12/03/2020: Establish care with Dede Query PA-C  CHIEF COMPLAINTS/PURPOSE OF CONSULTATION:  "Macrocytic Anemia "  HISTORY OF PRESENTING ILLNESS:  Aaron Rush 75 y.o. male with medical history significant for aortic atherosclerosis, GERD, coronary artery disease, congestive heart failure, type 2 diabetes, hiatal hernia, myocardial infarction.  Stroke, peripheral neuropathy, diverticulosis.  Patient is accompanied by his sister for this visit.  On exam today, Aaron Rush reports chronic fatigue for the last 6 months.  He currently goes to physical therapy twice a week and stroke in November 2021.  He does note improvement in his strength and ability to ambulate with physical therapy.  Patient has lost approximately 30 pounds in the last year that was unintentional.  He is unable to regain any weight.  He was recently started on pancreatic enzymes by the gastroenterology team without any improvement.  Patient denies any nausea, vomiting or abdominal pain.  He denies any postprandial cramping or discomfort.  Patient has regular bowel movements without any constipation or diarrhea.  He denies easy bruising or signs of  bleeding.  This includes hematochezia, melena, hematuria, epistaxis, hemoptysis or gingival bleeding.  Patient reports having bilateral tingling in his fingers and feet.  He does have swelling in his left foot greater than his right foot. Patient has a left foot drop and wears a brace during the day. He uses a walker to ambulate.  Patient denies any fevers, chills, night sweats, shortness of breath, chest pain, cough.  He has no other complaints.  Rest of the 10 point ROS is below.  MEDICAL HISTORY:  Past Medical History:  Diagnosis Date  . Aortic atherosclerosis ([place])   . Barrett's esophagus   . CAD (coronary artery disease)   . Cardiomyopathy ([place])   . CHF (congestive heart failure) ([place])   . Diabetes ([place])    type 2  . Diverticulosis   . Hiatal hernia   . Hyponatremia   . Myocardial infarction ([place])   . Neuropathy    feet  . Stroke (cerebrum) ([place]) 2021   Dx at [place] [place] [facility] visit in 05-2020    SURGICAL HISTORY: Past Surgical History:  Procedure Laterality Date  . BIOPSY  11/01/2020   Procedure: BIOPSY;  Surgeon: Rush Landmark Telford Nab., MD;  [facility] END[facility]COPY;  Service: Endoscopy;;  . ESOPHAGOGASTRODUODENOSCOPY    . ESOPHAGOGASTRODUODENOSCOPY (EGD) WITH PROPOFOL N/A 11/01/2020   Procedure: ESOPHAGOGASTRODUODENOSCOPY (EGD) WITH PROPOFOL;  Surgeon: Rush Landmark Telford Nab., MD;  Location: [facility];  Service: Endoscopy;  Laterality: N/A;  . EUS N/A 11/01/2020   Procedure: UPPER ENDOSCOPIC ULTRASOUND (EUS) RADIAL;  Surgeon: Rush Landmark Telford Nab., MD;  Location: [facility];  Service: Endoscopy;  Laterality: N/A;  . HERNIA REPAIR     umbilical in the 46'T    SOCIAL HISTORY: Social History   Socioeconomic History  . Marital  status: Divorced    Spouse name: Not on file  . Number of children: 0  . Years of education: Not on file  . Highest education level: Not on file  Occupational History  . Occupation: on leave from [employer]  Tobacco Use  . Smoking status:  Never Smoker  . Smokeless tobacco: Never Used  Vaping Use  . Vaping Use: Never used  Substance and Sexual Activity  . Alcohol use: Never    Comment: stopped 12/15/1979  . Drug use: Never  . Sexual activity: Not on file  Other Topics Concern  . Not on file  Social History Narrative   Right Handed   One Story Home    Drinks caffeine   Social Determinants of Health   Financial Resource Strain: Not on file  Food Insecurity: Not on file  Transportation Needs: Not on file  Physical Activity: Not on file  Stress: Not on file  Social Connections: Not on file  Intimate Partner Violence: Not on file    FAMILY HISTORY: Family History  Problem Relation Age of Onset  . COPD Father   . Colon cancer Neg Hx   . Esophageal cancer Neg Hx   . Stomach cancer Neg Hx   . Rectal cancer Neg Hx     ALLERGIES:  has No Known Allergies.  MEDICATIONS:  Current Outpatient Medications  Medication Sig Dispense Refill  . carvedilol (COREG) 3.125 MG tablet Take 3.125 mg by mouth 2 (two) times daily.    . [employer] CALCIUM 600 MG tablet Take 600 mg by mouth 2 (two) times daily.    Marland Kitchen ENTRESTO 24-26 MG Take 1 tablet by mouth 2 (two) times daily.    . furosemide (LASIX) 20 MG tablet Take 20 mg by mouth daily.    . Multiple Vitamin (MULTIVITAMIN WITH MINERALS) TABS tablet Take 1 tablet by mouth daily. 90 tablet 1  . pantoprazole (PROTONIX) 40 MG tablet Take 1 tablet (40 mg total) by mouth daily. 30 tablet 5  . sodium chloride 1 g tablet TAKE 1 TABLET (1 G TOTAL) BY MOUTH THREE TIMES DAILY WITH MEALS. (Patient taking differently: Take 1 g by mouth 3 (three) times daily with meals.) 21 tablet 0  . spironolactone (ALDACTONE) 25 MG tablet Take 12.5 mg by mouth daily.    . Thiamine HCl (VITAMIN B-1) 250 MG tablet Take 250 mg by mouth daily.    . vitamin B-12 (CYANOCOBALAMIN) 1000 MCG tablet Take 1,000 mcg by mouth daily.    . vitamin C (ASCORBIC ACID) 500 MG tablet Take 500 mg by mouth daily.    Marland Kitchen glipiZIDE  (GLUCOTROL) 5 MG tablet Take 5 mg by mouth daily before breakfast.     No current facility-administered medications for this visit.    REVIEW OF SYSTEMS:   Constitutional: ( - ) fevers, ( - )  chills , ( - ) night sweats Eyes: ( - ) blurriness of vision, ( - ) double vision, ( - ) watery eyes Ears, nose, mouth, throat, and face: ( - ) mucositis, ( - ) sore throat Respiratory: ( - ) cough, ( - ) dyspnea, ( - ) wheezes Cardiovascular: ( - ) palpitation, ( - ) chest discomfort, ( + ) lower extremity swelling Gastrointestinal:  ( - ) nausea, ( - ) heartburn, ( - ) change in bowel habits Skin: ( - ) abnormal skin rashes Lymphatics: ( - ) new lymphadenopathy, ( - ) easy bruising Neurological: ( - ) numbness, ( + )  tingling, ( - ) new weaknesses Behavioral/Psych: ( - ) mood change, ( - ) new changes  All other systems were reviewed with the patient and are negative.  PHYSICAL EXAMINATION: ECOG PERFORMANCE STATUS: 1 - Symptomatic but completely ambulatory  Vitals:   12/03/20 1337  BP: 125/66  Pulse: 84  Resp: 18  Temp: (!) 97.5 F (36.4 C)  SpO2: 100%   Filed Weights   12/03/20 1337  Weight: 119 lb (54 kg)    GENERAL: thin male in NAD  SKIN: skin color, texture, turgor are normal, no rashes or significant lesions EYES: conjunctiva are pink and non-injected, sclera clear OROPHARYNX: no exudate, no erythema; lips, buccal mucosa, and tongue normal. Poor dentition.  NECK: supple, non-tender LYMPH:  no palpable lymphadenopathy in the cervical, axillary or supraclavicular lymph nodes.  LUNGS: clear to auscultation and percussion with normal breathing effort HEART: regular rate & rhythm and no murmurs and no lower extremity edema ABDOMEN: soft, non-tender, non-distended, normal bowel sounds Musculoskeletal: no cyanosis of digits and no clubbing  PSYCH: alert & oriented x 3, fluent speech NEURO: no focal motor/sensory deficits  LABORATORY DATA:  I have reviewed the data as  listed CBC Latest Ref Rng & Units 12/03/2020 10/12/2020 06/25/2020  WBC 4.0 - 10.5 K/uL 6.6 6.8 6.1  Hemoglobin 13.0 - 17.0 g/dL 11.8(L) 12.9(L) 13.4  Hematocrit 39.0 - 52.0 % 33.5(L) 37.0(L) 36.1(L)  Platelets 150 - 400 K/uL 180 215.0 231    CMP Latest Ref Rng & Units 12/03/2020 10/12/2020 07/23/2020  Glucose 70 - 99 mg/dL 125(H) 180(H) 132(H)  BUN 8 - 23 mg/dL 20 25(H) 19  Creatinine 0.61 - 1.24 mg/dL 0.80 0.69 0.79  Sodium 135 - 145 mmol/L 141 139 135  Potassium 3.5 - 5.1 mmol/L 3.2(L) 3.5 3.8  Chloride 98 - 111 mmol/L 104 103 101  CO2 22 - 32 mmol/L _0 Calcium 8.9 - 10.3 mg/dL 9.6 9.9 9.5  Total Protein 6.5 - 8.1 g/dL 7.1 - -  Total Bilirubin 0.3 - 1.2 mg/dL 0.7 - -  Alkaline Phos 38 - 126 U/L 62 - -  AST 15 - 41 U/L 17 - -  ALT 0 - 44 U/L 19 - -   ASSESSMENT & PLAN Aaron Rush is a 75 y.o. male presenting to the clinic for evaluation for anemia. I reviewed most recent outside labs from 11/06/2020 with Hgb 11.8 and MCV 98.0. Prior to that, there was evidence of macrocytosis and patient reports taking vitamin B12 once daily. Patient denies any signs of bleeding and most recent EGD from 11/01/2020 did not reveal any evidence of bleeding. Patient is scheduled for a colonoscopy in June 2022. I recommend proceeding with a thorough workup which includes CBC, CMP, iron and TIBC, retic panel, ferritin, vitamin B12, homocysteine, methylmalonic acid, folate and SPEP.   #Macrocytic Anemia: --Patient currently takes vitamin B12 1000 mcg once daily.  --Patient denies any signs of bleeding but plans to have a colonoscopy next month --Recommend labs today to check CBC, CMP, iron and TIBC, retic panel, ferritin, vitamin B12, homocysteine, methylmalonic acid, folate and SPEP.  --RTC based on above workup.   Orders Placed This Encounter  Procedures  . CBC with Differential (Cancer Center Only)    Standing Status:   Future    Number of Occurrences:   1    Standing Expiration Date:   12/03/2021   . CMP ([facility] only)    Standing Status:   Future    Number  of Occurrences:   1    Standing Expiration Date:   12/03/2021  . Ferritin    Standing Status:   Future    Number of Occurrences:   1    Standing Expiration Date:   12/03/2021  . Retic Panel    Standing Status:   Future    Number of Occurrences:   1    Standing Expiration Date:   12/03/2021  . Vitamin B12    Standing Status:   Future    Number of Occurrences:   1    Standing Expiration Date:   12/03/2021  . Folate, Serum    Standing Status:   Future    Number of Occurrences:   1    Standing Expiration Date:   12/03/2021  . Methylmalonic acid, serum    Standing Status:   Future    Number of Occurrences:   1    Standing Expiration Date:   12/03/2021  . Lactate dehydrogenase (LDH)    Standing Status:   Future    Number of Occurrences:   1    Standing Expiration Date:   12/03/2021  . Multiple Myeloma Panel (SPEP&IFE w/QIG)    Standing Status:   Future    Number of Occurrences:   1    Standing Expiration Date:   12/03/2021  . Homocysteine, serum    Standing Status:   Future    Number of Occurrences:   1    Standing Expiration Date:   12/03/2021  . Iron and TIBC    Standing Status:   Future    Number of Occurrences:   1    Standing Expiration Date:   12/03/2021    All questions were answered. The patient knows to call the clinic with any problems, questions or concerns.  I have spent a total of 60 minutes minutes of face-to-face and non-face-to-face time, preparing to see the patient, obtaining and/or reviewing separately obtained history, performing a medically appropriate examination, counseling and educating the patient, ordering tests, documenting clinical information in the electronic health record and care coordination.   Dede Query, PA-C Department of Hematology/Oncology [place] at [facility] Phone: [phone]  Patient was seen with Dr. Lorenso Courier.   I have read the above note  and personally examined the patient. I agree with the assessment and plan as noted above.  Briefly Aaron Rush is a 75 year old male with medical history significant for macrocytic anemia who presents for evaluation.  The patient was diagnosed with vitamin B12 deficiency and is been taking vitamin B12 supplementation since that time.  He presents today for evaluation of his persistently low hemoglobin.  Today we will assure that the p.o. supplementation of vitamin B12 has been adequate with methylmalonic acid, homocystine, and vitamin B12 levels.  Additionally we will look for other possible causes including iron deficiency or hypoproduction.  Colonoscopy is already scheduled for next month.  If no clear etiology can be found for this patient's anemia would need to consider bone marrow biopsy.   Ledell Peoples, MD Department of Hematology/Oncology [place] at [facility] Phone: [phone] Pager: [phone] Email: [email or web address]

## 2020-12-04 DIAGNOSIS — I429 Cardiomyopathy, unspecified: Secondary | ICD-10-CM | POA: Diagnosis not present

## 2020-12-04 LAB — IRON AND TIBC
Iron: 99 ug/dL (ref 42–163)
Saturation Ratios: 35 % (ref 20–55)
TIBC: 283 ug/dL (ref 202–409)
UIBC: 183 ug/dL (ref 117–376)

## 2020-12-04 LAB — FERRITIN: Ferritin: 302 ng/mL (ref 24–336)

## 2020-12-04 LAB — HOMOCYSTEINE: Homocysteine: 10 umol/L (ref 0.0–19.2)

## 2020-12-06 DIAGNOSIS — R2681 Unsteadiness on feet: Secondary | ICD-10-CM | POA: Diagnosis not present

## 2020-12-06 DIAGNOSIS — R2689 Other abnormalities of gait and mobility: Secondary | ICD-10-CM | POA: Diagnosis not present

## 2020-12-06 DIAGNOSIS — M6281 Muscle weakness (generalized): Secondary | ICD-10-CM | POA: Diagnosis not present

## 2020-12-06 LAB — METHYLMALONIC ACID, SERUM: Methylmalonic Acid, Quantitative: 128 nmol/L (ref 0–378)

## 2020-12-07 DIAGNOSIS — R2681 Unsteadiness on feet: Secondary | ICD-10-CM | POA: Diagnosis not present

## 2020-12-07 DIAGNOSIS — R2689 Other abnormalities of gait and mobility: Secondary | ICD-10-CM | POA: Diagnosis not present

## 2020-12-07 DIAGNOSIS — M6281 Muscle weakness (generalized): Secondary | ICD-10-CM | POA: Diagnosis not present

## 2020-12-07 LAB — MULTIPLE MYELOMA PANEL, SERUM
Albumin SerPl Elph-Mcnc: 4.1 g/dL (ref 2.9–4.4)
Albumin/Glob SerPl: 1.6 (ref 0.7–1.7)
Alpha 1: 0.2 g/dL (ref 0.0–0.4)
Alpha2 Glob SerPl Elph-Mcnc: 0.6 g/dL (ref 0.4–1.0)
B-Globulin SerPl Elph-Mcnc: 1 g/dL (ref 0.7–1.3)
Gamma Glob SerPl Elph-Mcnc: 0.9 g/dL (ref 0.4–1.8)
Globulin, Total: 2.7 g/dL (ref 2.2–3.9)
IgA: 415 mg/dL (ref 61–437)
IgG (Immunoglobin G), Serum: 991 mg/dL (ref 603–1613)
IgM (Immunoglobulin M), Srm: 118 mg/dL (ref 15–143)
Total Protein ELP: 6.8 g/dL (ref 6.0–8.5)

## 2020-12-11 ENCOUNTER — Telehealth: Payer: Self-pay | Admitting: Physician Assistant

## 2020-12-11 NOTE — Telephone Encounter (Signed)
I called [name] today to review lab results from 12/03/2020 after our consultation. Labs revealed mild anemia with hemoglobin of 11.8. In addition, there was evidence of hypokalemia with potassium level of 3.2.   Regarding anemia, the remaining workup was unremarkable including no evidence of iron, B12 or folate deficiencies. I recommend to monitor closely and continue to take B12 supplementation. Patient will return to clinic in 3 months with repeat labs. No further intervention is recommended.    Regarding hypokalemia, patient said he is uncertain if he is currently taking potassium supplements. I reviewed his current medication list in Epic and oral potassium is not on the list. Likely cause of hypokalemia is due to Lasix. We will reach out to PCP, Lars Mage NP to confirm if he has been prescribed potassium and/or if he should start taking oral potassium with lasix.   Patient expressed understanding and satisfaction with the plan provided.

## 2020-12-12 ENCOUNTER — Encounter: Payer: Self-pay | Admitting: Gastroenterology

## 2020-12-12 ENCOUNTER — Telehealth: Payer: Self-pay

## 2020-12-12 ENCOUNTER — Other Ambulatory Visit: Payer: Self-pay

## 2020-12-12 ENCOUNTER — Ambulatory Visit (AMBULATORY_SURGERY_CENTER): Payer: Medicare HMO | Admitting: Gastroenterology

## 2020-12-12 VITALS — BP 103/53 | HR 75 | Temp 98.9°F | Resp 18 | Ht 71.0 in | Wt 116.0 lb

## 2020-12-12 DIAGNOSIS — D649 Anemia, unspecified: Secondary | ICD-10-CM

## 2020-12-12 DIAGNOSIS — D125 Benign neoplasm of sigmoid colon: Secondary | ICD-10-CM

## 2020-12-12 DIAGNOSIS — K648 Other hemorrhoids: Secondary | ICD-10-CM

## 2020-12-12 DIAGNOSIS — R634 Abnormal weight loss: Secondary | ICD-10-CM

## 2020-12-12 DIAGNOSIS — K5730 Diverticulosis of large intestine without perforation or abscess without bleeding: Secondary | ICD-10-CM | POA: Diagnosis not present

## 2020-12-12 MED ORDER — SODIUM CHLORIDE 0.9 % IV SOLN: 500.0000 mL | Freq: Once | INTRAVENOUS | Status: DC

## 2020-12-12 NOTE — Progress Notes (Signed)
Vs by N Campbell,LPN

## 2020-12-12 NOTE — Progress Notes (Signed)
BP 81/56 rt arm 76/52 left arm on adm reported to CRNA

## 2020-12-12 NOTE — Op Note (Signed)
[place] Patient Name: Aaron Rush Procedure Date: 12/12/2020 2:06 PM MRN: [id number] Endoscopist: Remo Lipps P. Havery Moros , MD Age: 75 Referring MD:  Date of Birth: [birth year] Gender: Male Account #: [id number] Procedure:                Colonoscopy Indications:              This is the patient's first colonoscopy, Weight                            loss, anemia of unclear etiology Medicines:                Monitored Anesthesia Care Procedure:                Pre-Anesthesia Assessment:                           - Prior to the procedure, a History and Physical                            was performed, and patient medications and                            allergies were reviewed. The patient's tolerance of                            previous anesthesia was also reviewed. The risks                            and benefits of the procedure and the sedation                            options and risks were discussed with the patient.                            All questions were answered, and informed consent                            was obtained. Prior Anticoagulants: The patient has                            taken no previous anticoagulant or antiplatelet                            agents. ASA Grade Assessment: III - A patient with                            severe systemic disease. After reviewing the risks                            and benefits, the patient was deemed in                            satisfactory condition to undergo the procedure.  After obtaining informed consent, the colonoscope                            was passed under direct vision. Throughout the                            procedure, the patient's blood pressure, pulse, and                            oxygen saturations were monitored continuously. The                            Olympus PFC-[id number] (#[id number]) Colonoscope was                            introduced through the  anus and advanced to the the                            terminal ileum, with identification of the                            appendiceal orifice and IC valve. The colonoscopy                            was performed without difficulty. The patient                            tolerated the procedure well. The quality of the                            bowel preparation was good. The terminal ileum,                            ileocecal valve, appendiceal orifice, and rectum                            were photographed. Scope In: 2:18:21 PM Scope Out: 2:36:23 PM Scope Withdrawal Time: 0 hours 12 minutes 34 seconds  Total Procedure Duration: 0 hours 18 minutes 2 seconds  Findings:                 The perianal and digital rectal examinations were                            normal.                           The terminal ileum appeared normal.                           Two flat and sessile polyps were found in the                            sigmoid colon. The polyps were 4 to 7 mm in size.  These polyps were removed with a cold snare.                            Resection and retrieval were complete.                           Scattered medium-mouthed diverticula were found in                            the entire colon.                           Internal hemorrhoids were found during                            retroflexion. The hemorrhoids were small.                           The exam was otherwise without abnormality. Complications:            No immediate complications. Estimated blood loss:                            Minimal. Estimated Blood Loss:     Estimated blood loss was minimal. Impression:               - The examined portion of the ileum was normal.                           - Two 4 to 7 mm polyps in the sigmoid colon,                            removed with a cold snare. Resected and retrieved.                           - Diverticulosis in the entire  examined colon.                           - Internal hemorrhoids.                           - The examination was otherwise normal.                           No cause for weight loss / anemia on this exam Recommendation:           - Patient has a contact number available for                            emergencies. The signs and symptoms of potential                            delayed complications were discussed with the                            patient. Return to normal activities  tomorrow.                            Written discharge instructions were provided to the                            patient.                           - Resume previous diet.                           - Continue present medications.                           - Await pathology results. Remo Lipps P. Shavell Nored, MD 12/12/2020 2:40:50 PM This report has been signed electronically.

## 2020-12-12 NOTE — Progress Notes (Signed)
Called to room to assist during endoscopic procedure.  Patient ID and intended procedure confirmed with present staff. Received instructions for my participation in the procedure from the performing physician.  

## 2020-12-12 NOTE — Telephone Encounter (Signed)
Per Dede Query, PA I contacted [facility] to ask Lars Mage, NP if the patient was taking potassium and if he isn't then would she recommend that he begin taking it. The representative that answered my call informed me that it took roughly 24-48 hours to get a response back, and advised me that after looking at the patient's medications she did not see that he was actively taking potassium. She stated that she had sent my message to [facility], NP and that we would receive a call back. Dede Query made aware.

## 2020-12-12 NOTE — Progress Notes (Signed)
pt tolerated well. VSS. awake and to recovery. Report given to RN.  

## 2020-12-12 NOTE — Patient Instructions (Signed)
Handouts given today: polyps, hemorrhoids, and diverticulosis  Await pathology results from Dr. Havery Moros   YOU HAD AN ENDOSCOPIC PROCEDURE TODAY AT THE [facility]:   Refer to the procedure report that was given to you for any specific questions about what was found during the examination.  If the procedure report does not answer your questions, please call your gastroenterologist to clarify.  If you requested that your care partner not be given the details of your procedure findings, then the procedure report has been included in a sealed envelope for you to review at your convenience later.  YOU SHOULD EXPECT: Some feelings of bloating in the abdomen. Passage of more gas than usual.  Walking can help get rid of the air that was put into your GI tract during the procedure and reduce the bloating. If you had a lower endoscopy (such as a colonoscopy or flexible sigmoidoscopy) you may notice spotting of blood in your stool or on the toilet paper. If you underwent a bowel prep for your procedure, you may not have a normal bowel movement for a few days.  Please Note:  You might notice some irritation and congestion in your nose or some drainage.  This is from the oxygen used during your procedure.  There is no need for concern and it should clear up in a day or so.  SYMPTOMS TO REPORT IMMEDIATELY:   Following lower endoscopy (colonoscopy or flexible sigmoidoscopy):  Excessive amounts of blood in the stool  Significant tenderness or worsening of abdominal pains  Swelling of the abdomen that is new, acute  Fever of 100F or higher  For urgent or emergent issues, a gastroenterologist can be reached at any hour by calling [phone]. Do not use MyChart messaging for urgent concerns.    DIET:  We do recommend a small meal at first, but then you may proceed to your regular diet.  Drink plenty of fluids but you should avoid alcoholic beverages for 24 hours.  ACTIVITY:  You should plan  to take it easy for the rest of today and you should NOT DRIVE or use heavy machinery until tomorrow (because of the sedation medicines used during the test).    FOLLOW UP: Our staff will call the number listed on your records 48-72 hours following your procedure to check on you and address any questions or concerns that you may have regarding the information given to you following your procedure. If we do not reach you, we will leave a message.  We will attempt to reach you two times.  During this call, we will ask if you have developed any symptoms of COVID 19. If you develop any symptoms (ie: fever, flu-like symptoms, shortness of breath, cough etc.) before then, please call [phone].  If you test positive for Covid 19 in the 2 weeks post procedure, please call and report this information to [name].    If any biopsies were taken you will be contacted by phone or by letter within the next 1-3 weeks.  Please call us at [phone] if you have not heard about the biopsies in 3 weeks.    SIGNATURES/CONFIDENTIALITY: You and/or your care partner have signed paperwork which will be entered into your electronic medical record.  These signatures attest to the fact that that the information above on your After Visit Summary has been reviewed and is understood.  Full responsibility of the confidentiality of this discharge information lies with you and/or your care-partner.

## 2020-12-14 ENCOUNTER — Telehealth: Payer: Self-pay

## 2020-12-14 NOTE — Telephone Encounter (Signed)
LVM

## 2020-12-28 ENCOUNTER — Telehealth: Payer: Self-pay

## 2020-12-28 NOTE — Telephone Encounter (Signed)
I contacted [facility] to follow up on Lars Mage, NP suggestion for the patient taking potassium. I was informed by Danae Chen, CMA that Ms. Hemberg did confirm that the patient was not taking potassium and she recommended that he begin taking it. Dede Query, PA made aware. Per Murray Hodgkins, I contacted their office again to ask [facility] to prescribe the potassium for the patient since we see him so infrequently. [place] took my message and advised me that Ms. Hemberg and her CMA would see my message on 6/20 and follow up with the patient accordingly in order to prescribe him potassium.

## 2020-12-31 ENCOUNTER — Other Ambulatory Visit: Payer: Self-pay | Admitting: *Deleted

## 2020-12-31 ENCOUNTER — Encounter: Payer: Self-pay | Admitting: *Deleted

## 2020-12-31 DIAGNOSIS — E1159 Type 2 diabetes mellitus with other circulatory complications: Secondary | ICD-10-CM | POA: Diagnosis not present

## 2020-12-31 DIAGNOSIS — E119 Type 2 diabetes mellitus without complications: Secondary | ICD-10-CM | POA: Diagnosis not present

## 2020-12-31 DIAGNOSIS — I152 Hypertension secondary to endocrine disorders: Secondary | ICD-10-CM | POA: Diagnosis not present

## 2020-12-31 DIAGNOSIS — E871 Hypo-osmolality and hyponatremia: Secondary | ICD-10-CM | POA: Diagnosis not present

## 2020-12-31 NOTE — Patient Outreach (Signed)
[facility]) Care Management  [place]  01/01/2021   Aaron Rush 07-30-1945 [id number]   Referral Date: 6/16  Referral Source: Insurance Referral Reason: Chronic care management Insurance: [place] attempt #1, successful.  Identity verified.  This care manager introduced self and stated purpose of call.  [facility] care management services explained.    Social: Member report living alone but has been able to manage independently.  Had stroke last year and has had home health PT intermittently since then.  State once he feels he is doing much better and getting stronger, his PT services expire.  Feels he loses some strength in the time it takes to reorder and have new assessment completed.  His last session was the first of June, will discuss new order with PCP.    Conditions: Per chart, has history of protein calorie malnutrition, CHF, CAD, DM, and stroke.  Medications: Reviewed with member, state he is taking as instructed.  Feels much better since taking the Vitamin B12 complex.  Expresses frustration regarding having to pay for a 90 day supply of Entresto, which is the most expensive medication he is on.  Last pay over $400 for medication pick up.  Most of his other meds are reportedly $20-40, which he report is manageable.    Appointments: Report he has just started driving again, able to provide own transportation to appointments.  Was seen in the last few months by PCP, GI, and Oncology to assess weight loss over the last 6-9 months.  Has appointment for labs today and will follow up with PCP within the next couple weeks.  Advance Directives: Discussed with member, does not have paperwork currently but state sister is helping with completing.  Consent: Member agrees to [facility] involvement.  Encounter Medications:  Outpatient Encounter Medications as of 12/31/2020  Medication Sig   carvedilol (COREG) 3.125 MG tablet Take 3.125 mg by mouth 2 (two) times  daily.   [employer] CALCIUM 600 MG tablet Take 600 mg by mouth 2 (two) times daily.   ENTRESTO 24-26 MG Take 1 tablet by mouth 2 (two) times daily.   furosemide (LASIX) 20 MG tablet Take 20 mg by mouth daily.   Multiple Vitamin (MULTIVITAMIN WITH MINERALS) TABS tablet Take 1 tablet by mouth daily.   pantoprazole (PROTONIX) 40 MG tablet Take 1 tablet (40 mg total) by mouth daily.   sodium chloride 1 g tablet TAKE 1 TABLET (1 G TOTAL) BY MOUTH THREE TIMES DAILY WITH MEALS. (Patient taking differently: Take 1 g by mouth 3 (three) times daily with meals.)   spironolactone (ALDACTONE) 25 MG tablet Take 12.5 mg by mouth daily.   Thiamine HCl (VITAMIN B-1) 250 MG tablet Take 250 mg by mouth daily.   vitamin B-12 (CYANOCOBALAMIN) 1000 MCG tablet Take 1,000 mcg by mouth daily.   vitamin C (ASCORBIC ACID) 500 MG tablet Take 500 mg by mouth daily.   glipiZIDE (GLUCOTROL) 5 MG tablet Take 5 mg by mouth daily before breakfast.   No facility-administered encounter medications on file as of 12/31/2020.    Functional Status:  In your present state of health, do you have any difficulty performing the following activities: 06/21/2020  Hearing? N  Vision? N  Difficulty concentrating or making decisions? N  Walking or climbing stairs? Y  Dressing or bathing? N  Doing errands, shopping? Y  Some recent data might be hidden    Fall/Depression Screening: Fall Risk  12/31/2020 08/24/2020 05/24/2020  Falls in the past  year? 0 0 1  Number falls in past yr: 0 0 1  Injury with Fall? 0 0 0  Risk for fall due to : No Fall Risks - -  Follow up Falls evaluation completed - -   PHQ 2/9 Scores 12/31/2020  PHQ - 2 Score 0    Assessment:   Care Plan Care Plan : Heart Failure (Adult)  Updates made by Valente David, RN since 01/01/2021 12:00 AM     Problem: Symptom Exacerbation (Heart Failure)      Goal: Symptom Exacerbation Prevented or Minimized   Start Date: 12/31/2020  Expected End Date: 01/31/2021  Priority: High   Note:   Evidence-based guidance:  Perform or review cognitive and/or health literacy screening.  Assess understanding of adherence and barriers to treatment plan, as well as lifestyle changes; develop strategies to address barriers.  Establish a mutually-agreed-upon early intervention process to communicate with primary care provider when signs/symptoms worsen.  Facilitate timely posthospital discharge or emergency department treatment that includes intensive follow-up via telephone calls, home visit, telehealth monitoring and care at multidisciplinary heart failure clinic.  Adjust frequency and intensity of follow-up based on presentation, number of emergency department visits, hospital admissions and frequency and severity of symptom exacerbation.  Facilitate timely visit, usually within 1 week, with primary care provider following hospital discharge.  Collaborate with clinical pharmacist to address adverse drug reactions, drug interactions, subtherapeutic dosage, patient and family education.  Regularly screen for presence of depressive symptoms using a validated tool; consider pharmacologic therapy and/or referral for cognitive behavioral therapy when present.  Refer to community-based services, such as a heart failure support group, community Economist or peer support program.  Review immunization status; arrange receipt of needed vaccinations.  Prepare patient for home oxygen use based on signs/symptoms.   Notes:     Task: Identify and Minimize Risk of Heart Failure Exacerbation   Due Date: 01/31/2021  Note:   Care Management Activities:    - barriers to treatment reviewed and addressed - depression screen reviewed - healthy lifestyle promoted    Notes:     Problem: Disease Progression (Heart Failure)      Long-Range Goal: Health Optimized   Start Date: 12/31/2020  Expected End Date: 04/01/2021  Priority: Medium  Note:   Evidence-based guidance:  Use brief intervention,  such as 5 A's (Ask, Advise, Assess, Assist, Arrange) to encourage smoking cessation; refer to smoking cessation program, if ready for more intensive intervention.  Perform or refer to a registered dietitian for a nutrition assessment and nutrition-focused physical exam.   Identify potential micronutrient deficiencies, such as iron, vitamin D and thiamin.  Assess need for potential diet and fluid modification, such as reduced sodium or fluid intake.  Minimize unnecessary dietary restrictions to increase oral intake. Note: Sodium restriction should be individualized to the patient and clinical status.  Facilitate home monitoring of weight.   Notes:     Task: Optimize Health   Due Date: 04/01/2021  Note:   Care Management Activities:    - fluid modification encouraged - home monitoring of weight gain or loss encouraged    Notes:       Goals Addressed             This Visit's Progress    [facility] - Find Help in My Community       Timeframe:  Short-Term Goal Priority:  High Start Date:           6/20  Expected End Date:       7/20                Follow Up Date 01/23/2021   Barriers: Financial    - follow-up on any referrals for help I am given - have a back-up plan    Why is this important?   Knowing how and where to find help for yourself or family in your neighborhood and community is an important skill.  You will want to take some steps to learn how.    Notes:   6/20 - report cost of [name] has been expensive, referral placed to [place] team      [place] or Improve My Strength-Stroke       Timeframe:  Long-Range Goal Priority:  Medium Start Date:        6/20                     Expected End Date:       9/20                Follow Up Date 01/23/2021   Barriers: Other - physical weakness    - arrange in-home help services - eat healthy to increase strength - increase activity or exercise time a little every week    Why is this  important?   Before the stroke you probably did not think much about being safe when you are up and about.  Now, it may be harder for you to get around.  It may also be easier for you to trip or fall.  It is common to have muscle weakness after a stroke. You may also feel like you cannot control an arm or leg.  It will be helpful to work with a physical therapist to get your strength and muscle control back.  It is good to stay as active as you can. Walking and stretching help you stay strong and flexible.  The physical therapist will develop an exercise program just for you.     Notes:   6/20 - Discussed ongoing waves of weakness since stroke last year, will discuss restarting home PT with PCP         Plan:  Follow-up: Patient agrees to Care Plan and Follow-up. Follow-up in 3 week(s).  Will notify PCP of [facility] involvement.  Will send education regarding weight management.  Valente David, [place], MSN [place] [phone]

## 2020-12-31 NOTE — Patient Outreach (Signed)
[facility]) Care Management  12/31/2020  Aaron Rush 09-Jun-1946 [id number]   Referral for medication assistance from [facility], RN sent to [place].  Ina Homes [facility] Management Assistant [phone]

## 2021-01-03 ENCOUNTER — Telehealth: Payer: Self-pay

## 2021-01-03 DIAGNOSIS — Z596 Low income: Secondary | ICD-10-CM

## 2021-01-03 NOTE — Progress Notes (Signed)
[facility])  [place] Team    01/03/2021  Aaron Rush 08-Jul-1946 [id number]  Reason for referral: Medication Assistance with [facility]  Referral source:  [place] Current insurance: [facility]  PMHx includes but not limited to:  This patient has a PMH type 2 diabetes, pancreatic lesion, CAD, and CHF.    Outreach:  Successful telephone call with [name].  HIPAA identifiers verified (the first and second attempt).   Subjective:  [facility] pharmacy was consulted for a medication assistance referral for Entresto. When reviewing the patients chart, he also could benefit from PAP for Creon as it typically expensive. Per chart review, Entresto cost ~ $400 and patient was sent PAP for Creon.   I attempted to contact the patient on three separate occassions. On 6/21, he stated he was out to dinner and ask that I call him back the following day. He did confirm that Entresto was $417.83. On 6/22, he stated he was in traffic and could not/would not talk on the phone while in traffic; he also wanted to get home to verify income information. He did disclose that he was the only person living in his household with the exception of his pets. Today, I called him but was not able to talk to him and I left a voicemail.  At this time, I am unsure if he was approved for the Creon PAP and unable to gather information for Entresto PAP.    Objective:   No Known Allergies  Medications Reviewed Today     Reviewed by Aaron David, RN (Registered Nurse) on 12/31/20 at 1211  Med List Status: <None>   Medication Order Taking? Sig Documenting Provider Last Dose Status Informant  carvedilol (COREG) 3.125 MG tablet [id number]  Take 3.125 mg by mouth 2 (two) times daily. [provider]  Active Self  CVS CALCIUM 600 MG tablet [id number]  Take 600 mg by mouth 2 (two) times daily. [provider]  Active Self  ENTRESTO 24-26 MG  [id number]  Take 1 tablet by mouth 2 (two) times daily. [provider]  Active Self  furosemide (LASIX) 20 MG tablet [id number]  Take 20 mg by mouth daily. [provider]  Active Self  glipiZIDE (GLUCOTROL) 5 MG tablet [id number]  Take 5 mg by mouth daily before breakfast. [provider]  Expired 12/12/20 2359 Self  Multiple Vitamin (MULTIVITAMIN WITH MINERALS) TABS tablet [id number]  Take 1 tablet by mouth daily. Mercy Riding, MD  Active Self  pantoprazole (PROTONIX) 40 MG tablet [id number]  Take 1 tablet (40 mg total) by mouth daily. Mansouraty, Telford Nab., MD  Active   sodium chloride 1 g tablet [id number]  TAKE 1 TABLET (1 G TOTAL) BY MOUTH THREE TIMES DAILY WITH MEALS.  Patient taking differently: Take 1 g by mouth 3 (three) times daily with meals.   Mercy Riding, MD  Active   spironolactone (ALDACTONE) 25 MG tablet [id number]  Take 12.5 mg by mouth daily. [provider]  Active Self  Thiamine HCl (VITAMIN B-1) 250 MG tablet [id number]  Take 250 mg by mouth daily. [provider]  Active Self  vitamin B-12 (CYANOCOBALAMIN) 1000 MCG tablet [id number]  Take 1,000 mcg by mouth daily. [provider]  Active Self  vitamin C (ASCORBIC ACID) 500 MG tablet [id number]  Take 500 mg by mouth daily. [provider]  Active Self            Assessment:  Given the limited information, I am unable to move forward with a patient assistant application for this patient.     Medication Review Findings:  Entrestor - unclear if patient qualifies for PAP at this time due to inability to outreach patient  Creon - patient is currently getting samples during application process; unclear if patient has been approved     Medication Assistance Findings:  Medication assistance needs identified: Entresto and Creon  Extra Help:   - Unknown  Patient Assistance Programs:   - N/A   Plan: Will contact Aaron Rush ([place] management) regarding  inability to reach patient.   Will close Aaron Rush pharmacy case as no further medication needs identified at this time.  I am happy to assist in the future as needed.     Thank you for allowing pharmacy to be a part of this patient's care.  Kristeen Miss, PharmD Clinical Pharmacist [place] Cell: [phone]

## 2021-01-09 DIAGNOSIS — E1142 Type 2 diabetes mellitus with diabetic polyneuropathy: Secondary | ICD-10-CM | POA: Diagnosis not present

## 2021-01-09 DIAGNOSIS — I6930 Unspecified sequelae of cerebral infarction: Secondary | ICD-10-CM | POA: Diagnosis not present

## 2021-01-09 DIAGNOSIS — K219 Gastro-esophageal reflux disease without esophagitis: Secondary | ICD-10-CM | POA: Diagnosis not present

## 2021-01-09 DIAGNOSIS — E1159 Type 2 diabetes mellitus with other circulatory complications: Secondary | ICD-10-CM | POA: Diagnosis not present

## 2021-01-09 DIAGNOSIS — R911 Solitary pulmonary nodule: Secondary | ICD-10-CM | POA: Diagnosis not present

## 2021-01-09 DIAGNOSIS — I152 Hypertension secondary to endocrine disorders: Secondary | ICD-10-CM | POA: Diagnosis not present

## 2021-01-09 DIAGNOSIS — E871 Hypo-osmolality and hyponatremia: Secondary | ICD-10-CM | POA: Diagnosis not present

## 2021-01-09 DIAGNOSIS — N289 Disorder of kidney and ureter, unspecified: Secondary | ICD-10-CM | POA: Diagnosis not present

## 2021-01-09 DIAGNOSIS — I509 Heart failure, unspecified: Secondary | ICD-10-CM | POA: Diagnosis not present

## 2021-01-17 DIAGNOSIS — R2689 Other abnormalities of gait and mobility: Secondary | ICD-10-CM | POA: Diagnosis not present

## 2021-01-17 DIAGNOSIS — M6281 Muscle weakness (generalized): Secondary | ICD-10-CM | POA: Diagnosis not present

## 2021-01-17 DIAGNOSIS — R2681 Unsteadiness on feet: Secondary | ICD-10-CM | POA: Diagnosis not present

## 2021-01-23 ENCOUNTER — Other Ambulatory Visit: Payer: Self-pay | Admitting: *Deleted

## 2021-01-23 NOTE — Patient Outreach (Addendum)
[facility]) Care Management  01/23/2021  Aaron Rush 1945/07/18 [id number]   Outgoing call placed to member, state he is doing well.  BP and CBG have remained stable, A1C 6.7.  Denies any urgent concerns, encouraged to contact this care manager with questions.  Agrees to follow up within the next 3 months.   Goals Addressed             This Visit's Progress    [facility] - Find Help in My Community   On track    Timeframe:  Short-Term Goal Priority:  High Start Date:           6/20                  Expected End Date:       7/20                Follow Up Date 01/23/2021   Barriers: Financial    - follow-up on any referrals for help I am given - have a back-up plan    Why is this important?   Knowing how and where to find help for yourself or family in your neighborhood and community is an important skill.  You will want to take some steps to learn how.    Notes:   6/20 - report cost of [name] has been expensive, referral placed to [place] team  7/13 [facility] he paid full cost for medication this past week.  Advised that [place] team made attempts to reach him for assistance.  Provided with contact information, encouraged to call      [place] or Improve My Strength-Stroke   On track    Timeframe:  Long-Range Goal Priority:  Medium Start Date:        6/20                     Expected End Date:       9/20                Follow Up Date 01/23/2021   Barriers: Other - physical weakness    - arrange in-home help services - eat healthy to increase strength - increase activity or exercise time a little every week    Why is this important?   Before the stroke you probably did not think much about being safe when you are up and about.  Now, it may be harder for you to get around.  It may also be easier for you to trip or fall.  It is common to have muscle weakness after a stroke. You may also feel like you cannot control an arm or leg.  It  will be helpful to work with a physical therapist to get your strength and muscle control back.  It is good to stay as active as you can. Walking and stretching help you stay strong and flexible.  The physical therapist will develop an exercise program just for you.     Notes:   6/20 - Discussed ongoing waves of weakness since stroke last year, will discuss restarting home PT with PCP  7/13 - State he has not been able to restart home PT sessions but report he has been managing well.  Continues to use strengthening techniques used by PT        Valente David, RN, MSN [place] Manager [phone]

## 2021-01-24 DIAGNOSIS — M6281 Muscle weakness (generalized): Secondary | ICD-10-CM | POA: Diagnosis not present

## 2021-01-24 DIAGNOSIS — R2689 Other abnormalities of gait and mobility: Secondary | ICD-10-CM | POA: Diagnosis not present

## 2021-01-24 DIAGNOSIS — R2681 Unsteadiness on feet: Secondary | ICD-10-CM | POA: Diagnosis not present

## 2021-01-25 DIAGNOSIS — R7989 Other specified abnormal findings of blood chemistry: Secondary | ICD-10-CM | POA: Diagnosis not present

## 2021-02-01 DIAGNOSIS — E1144 Type 2 diabetes mellitus with diabetic amyotrophy: Secondary | ICD-10-CM | POA: Diagnosis not present

## 2021-02-01 DIAGNOSIS — R531 Weakness: Secondary | ICD-10-CM | POA: Diagnosis not present

## 2021-02-01 DIAGNOSIS — R200 Anesthesia of skin: Secondary | ICD-10-CM | POA: Diagnosis not present

## 2021-02-06 DIAGNOSIS — M6281 Muscle weakness (generalized): Secondary | ICD-10-CM | POA: Diagnosis not present

## 2021-02-06 DIAGNOSIS — R2681 Unsteadiness on feet: Secondary | ICD-10-CM | POA: Diagnosis not present

## 2021-02-06 DIAGNOSIS — R2689 Other abnormalities of gait and mobility: Secondary | ICD-10-CM | POA: Diagnosis not present

## 2021-02-11 DIAGNOSIS — I509 Heart failure, unspecified: Secondary | ICD-10-CM | POA: Diagnosis not present

## 2021-02-11 DIAGNOSIS — I152 Hypertension secondary to endocrine disorders: Secondary | ICD-10-CM | POA: Diagnosis not present

## 2021-02-11 DIAGNOSIS — U071 COVID-19: Secondary | ICD-10-CM | POA: Diagnosis not present

## 2021-02-11 DIAGNOSIS — E1159 Type 2 diabetes mellitus with other circulatory complications: Secondary | ICD-10-CM | POA: Diagnosis not present

## 2021-02-14 ENCOUNTER — Telehealth: Payer: Self-pay

## 2021-02-14 NOTE — Telephone Encounter (Signed)
Per 11/22/20 office note, pt will be due for repeat MRCP in 04/2021. New reminder in epic.

## 2021-02-14 NOTE — Telephone Encounter (Signed)
-----   Message from Yevette Edwards, RN sent at 08/17/2020 11:00 AM EST ----- Regarding: MRCP Repeat MRCP - pancreatic duct dilation, renal cysts  See 08/15/20 MRCP result note for more information

## 2021-02-20 DIAGNOSIS — K449 Diaphragmatic hernia without obstruction or gangrene: Secondary | ICD-10-CM | POA: Diagnosis not present

## 2021-02-20 DIAGNOSIS — R918 Other nonspecific abnormal finding of lung field: Secondary | ICD-10-CM | POA: Diagnosis not present

## 2021-03-11 ENCOUNTER — Telehealth: Payer: Self-pay | Admitting: Physician Assistant

## 2021-03-11 NOTE — Telephone Encounter (Signed)
Scheduled per sch msg. Called and spoke with patient. Confirmed  Appt

## 2021-03-19 ENCOUNTER — Telehealth: Payer: Self-pay

## 2021-03-19 DIAGNOSIS — R634 Abnormal weight loss: Secondary | ICD-10-CM

## 2021-03-19 DIAGNOSIS — I5042 Chronic combined systolic (congestive) and diastolic (congestive) heart failure: Secondary | ICD-10-CM

## 2021-03-20 ENCOUNTER — Other Ambulatory Visit: Payer: Self-pay

## 2021-03-20 ENCOUNTER — Inpatient Hospital Stay: Payer: Medicare HMO

## 2021-03-20 ENCOUNTER — Inpatient Hospital Stay: Payer: Medicare HMO | Attending: Physician Assistant | Admitting: Physician Assistant

## 2021-03-20 DIAGNOSIS — D649 Anemia, unspecified: Secondary | ICD-10-CM | POA: Diagnosis not present

## 2021-03-20 DIAGNOSIS — E119 Type 2 diabetes mellitus without complications: Secondary | ICD-10-CM | POA: Diagnosis not present

## 2021-03-20 DIAGNOSIS — I5042 Chronic combined systolic (congestive) and diastolic (congestive) heart failure: Secondary | ICD-10-CM

## 2021-03-20 LAB — CBC WITH DIFFERENTIAL (CANCER CENTER ONLY)
Abs Immature Granulocytes: 0.02 10*3/uL (ref 0.00–0.07)
Basophils Absolute: 0.1 10*3/uL (ref 0.0–0.1)
Basophils Relative: 1 %
Eosinophils Absolute: 0.5 10*3/uL (ref 0.0–0.5)
Eosinophils Relative: 6 %
HCT: 34.6 % — ABNORMAL LOW (ref 39.0–52.0)
Hemoglobin: 12 g/dL — ABNORMAL LOW (ref 13.0–17.0)
Immature Granulocytes: 0 %
Lymphocytes Relative: 27 %
Lymphs Abs: 2 10*3/uL (ref 0.7–4.0)
MCH: 34 pg (ref 26.0–34.0)
MCHC: 34.7 g/dL (ref 30.0–36.0)
MCV: 98 fL (ref 80.0–100.0)
Monocytes Absolute: 0.7 10*3/uL (ref 0.1–1.0)
Monocytes Relative: 9 %
Neutro Abs: 4.2 10*3/uL (ref 1.7–7.7)
Neutrophils Relative %: 57 %
Platelet Count: 207 10*3/uL (ref 150–400)
RBC: 3.53 MIL/uL — ABNORMAL LOW (ref 4.22–5.81)
RDW: 13.5 % (ref 11.5–15.5)
WBC Count: 7.4 10*3/uL (ref 4.0–10.5)
nRBC: 0 % (ref 0.0–0.2)

## 2021-03-20 NOTE — Progress Notes (Addendum)
[place] [phone]   Fax:([phone]  PROGRESS NOTE  Patient Care Team: Bernerd Limbo, MD as PCP - General (Family Medicine) Alda Berthold, DO as Consulting Physician (Neurology) Valente David, RN as [place] Management  Hematological/Oncological History 1) Labs from PCP, Bridget Hartshorn NP -07/03/2020: WBC 6.1, Hgb 12.6, MCV 101.4, Plt 343 -09/11/2020: WBC 7.1, Hgb 12.6, MCV 101.9, Plt 286 -11/06/2020:WBC 5.7, Hgb 11.8 , MCV 98.0, Plt 232  2) 11/01/2020: EUS/EGD: No gross lesions. Salmon-colored mucosa present from 36-37 cm, Large 6 cm hiatal hernia present, gastritis in antrum/body. Pancreatic parenchymal abnormalities consisting of atrophy, lobularity with honeycombing, hyperechoic foci without shadowing and hyperechoic strands were noted in the entire pancreas.  3) 12/03/2020: Establish care with Dede Query PA-C  4) 12/12/2020: Colonoscopy: Two 4 to 7 mm polyps in the sigmoid colon, removed with a cold snare. Resected and retrieved.Diverticulosis in the entire examined colon.Internal hemorrhoids.The examination was otherwise normal.  CHIEF COMPLAINT: Anemia  HISTORY OF PRESENTING ILLNESS:  Blas Riches 75 y.o. male returns for a follow up for anemia. Patient is accompanied by his sister for this visit. He was last seen in clinic on 12/03/2020.   On exam today, [name] reports that his energy levels have improved. He is able to ambulate on his own using a cane. His appetite has improved and he has gained weight. He denies any nausea, vomiting or abdominal pain. Patient has regular bowel movements without any constipation or diarrhea.  He denies easy bruising or signs of bleeding.  This includes hematochezia, melena, hematuria, epistaxis, hemoptysis or gingival bleeding.  He does have swelling in his left foot greater than his right foot. Patient has a left foot drop and wears a brace during the day.  Patient denies any  fevers, chills, night sweats, shortness of breath, chest pain, cough.  He has no other complaints.  Rest of the 10 point ROS is below.  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   Dx at [facility] visit in 05-2020    SURGICAL HISTORY: Past Surgical History:  Procedure Laterality Date   BIOPSY  11/01/2020   Procedure: BIOPSY;  Surgeon: Irving Copas., MD;  Location: [facility] ENDOSCOPY;  Service: Endoscopy;;   ESOPHAGOGASTRODUODENOSCOPY     ESOPHAGOGASTRODUODENOSCOPY (EGD) WITH PROPOFOL N/A 11/01/2020   Procedure: ESOPHAGOGASTRODUODENOSCOPY (EGD) WITH PROPOFOL;  Surgeon: Irving Copas., MD;  Location[facility]COPY;  Service: Endoscopy;  Laterality: N/A;   EUS N/A 11/01/2020   Procedure: UPPER ENDOSCOPIC ULTRASOUND (EUS) RADIAL;  Surgeon: Rush Landmark Telford Nab., MD;  Location: [place];  Service: Endoscopy;  Laterality: N/A;   HERNIA REPAIR     umbilical in the 62'Z    SOCIAL HISTORY: Social History   Socioeconomic History   Marital status: Divorced    Spouse name: Not on file   Number of children: 0   Years of education: Not on file   Highest education level: Not on file  Occupational History   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  Tobacco Use   Smoking status: Never  Smokeless tobacco: Never  Vaping Use   Vaping Use: Never used  Substance and Sexual Activity   Alcohol use: Never    Comment: stopped 12/15/1979   Drug use: Never   Sexual activity: Not on file  Other Topics Concern   Not on file  Social History Narrative   Right Handed   One Story Home    Drinks caffeine   Social Determinants of Health   Financial  Resource Strain: Not on file  Food Insecurity: No Food Insecurity   Worried About Charity fundraiser in the Last Year: Never true   Ran Out of Food in the Last Year: Never true  Transportation Needs: No Transportation Needs   Lack of Transportation (Medical): No   Lack of Transportation (Non-Medical): No  Physical Activity: Not on file  Stress: Not on file  Social Connections: Not on file  Intimate Partner Violence: Not on file    FAMILY HISTORY: Family History  Problem Relation Age of Onset   COPD Father    Colon cancer Neg Hx    Esophageal cancer Neg Hx    Stomach cancer Neg Hx    Rectal cancer Neg Hx     ALLERGIES:  has No Known Allergies.  MEDICATIONS:  Current Outpatient Medications  Medication Sig Dispense Refill   carvedilol (COREG) 3.125 MG tablet Take 3.125 mg by mouth 2 (two) times daily.     [employer] CALCIUM 600 MG tablet Take 600 mg by mouth 2 (two) times daily.     ENTRESTO 24-26 MG Take 1 tablet by mouth 2 (two) times daily.     furosemide (LASIX) 20 MG tablet Take 20 mg by mouth daily.     glipiZIDE (GLUCOTROL) 5 MG tablet Take 5 mg by mouth daily before breakfast.     Multiple Vitamin (MULTIVITAMIN WITH MINERALS) TABS tablet Take 1 tablet by mouth daily. 90 tablet 1   pantoprazole (PROTONIX) 40 MG tablet Take 1 tablet (40 mg total) by mouth daily. 30 tablet 5   sodium chloride 1 g tablet TAKE 1 TABLET (1 G TOTAL) BY MOUTH THREE TIMES DAILY WITH MEALS. (Patient taking differently: Take 1 g by mouth 3 (three) times daily with meals.) 21 tablet 0   spironolactone (ALDACTONE) 25 MG tablet Take 12.5 mg by mouth daily.     Thiamine HCl (VITAMIN B-1) 250 MG tablet Take 250 mg by mouth daily.     vitamin B-12 (CYANOCOBALAMIN) 1000 MCG tablet Take 1,000 mcg by mouth daily.     vitamin C (ASCORBIC ACID) 500 MG tablet Take 500 mg by mouth daily.     No current facility-administered medications for this visit.    REVIEW OF SYSTEMS:   Constitutional: ( - ) fevers, ( - )   chills , ( - ) night sweats Eyes: ( - ) blurriness of vision, ( - ) double vision, ( - ) watery eyes Ears, nose, mouth, throat, and face: ( - ) mucositis, ( - ) sore throat Respiratory: ( - ) cough, ( - ) dyspnea, ( - ) wheezes Cardiovascular: ( - ) palpitation, ( - ) chest discomfort, ( - ) lower extremity swelling Gastrointestinal:  ( - ) nausea, ( - ) heartburn, ( - ) change in bowel habits Skin: ( - ) abnormal skin rashes Lymphatics: ( - ) new lymphadenopathy, ( - ) easy bruising Neurological: ( - ) numbness, ( - ) tingling, ( - ) new weaknesses Behavioral/Psych: ( - ) mood change, ( - )  new changes  All other systems were reviewed with the patient and are negative.  PHYSICAL EXAMINATION: ECOG PERFORMANCE STATUS: 1 - Symptomatic but completely ambulatory  Vitals:   03/20/21 1439  BP: 110/69  Pulse: 81  Resp: 17  Temp: 97.9 F (36.6 C)  SpO2: 98%   Filed Weights   03/20/21 1439  Weight: 121 lb 1.6 oz (54.9 kg)    GENERAL: thin male in NAD  SKIN: skin color, texture, turgor are normal, no rashes or significant lesions EYES: conjunctiva are pink and non-injected, sclera clear LUNGS: clear to auscultation and percussion with normal breathing effort HEART: regular rate & rhythm and no murmurs and no lower extremity edema ABDOMEN: soft, non-tender, non-distended, normal bowel sounds Musculoskeletal: no cyanosis of digits and no clubbing  PSYCH: alert & oriented x 3, fluent speech NEURO: no focal motor/sensory deficits  LABORATORY DATA:  I have reviewed the data as listed CBC Latest Ref Rng & Units 03/20/2021 12/03/2020 10/12/2020  WBC 4.0 - 10.5 K/uL 7.4 6.6 6.8  Hemoglobin 13.0 - 17.0 g/dL 12.0(L) 11.8(L) 12.9(L)  Hematocrit 39.0 - 52.0 % 34.6(L) 33.5(L) 37.0(L)  Platelets 150 - 400 K/uL 207 180 215.0    CMP Latest Ref Rng & Units 12/03/2020 10/12/2020 07/23/2020  Glucose 70 - 99 mg/dL 125(H) 180(H) 132(H)  BUN 8 - 23 mg/dL 20 25(H) 19  Creatinine 0.61 - 1.24 mg/dL 0.80 0.69  0.79  Sodium 135 - 145 mmol/L 141 139 135  Potassium 3.5 - 5.1 mmol/L 3.2(L) 3.5 3.8  Chloride 98 - 111 mmol/L 104 103 101  CO2 22 - 32 mmol/L 28 29 28   Calcium 8.9 - 10.3 mg/dL 9.6 9.9 9.5  Total Protein 6.5 - 8.1 g/dL 7.1 - -  Total Bilirubin 0.3 - 1.2 mg/dL 0.7 - -  Alkaline Phos 38 - 126 U/L 62 - -  AST 15 - 41 U/L 17 - -  ALT 0 - 44 U/L 19 - -   ASSESSMENT & PLAN Anthony Tamburo is a 75 y.o. male returns for a follow up for anemia.   #Anemia, etiology unknown: --Patient currently takes vitamin B12 1000 mcg once daily.  --Patient denies any signs of bleeding. Colonoscopy from 12/12/2020 showed no evidence of bleeding.  --Workup from 12/03/2020 ruled out iron deficiency, vitamin B12 deficiency, folate deficiency and paraproteinemia. --Labs today show stable, mild anemia with hemoglobin of 12.0, MCV 98.0.  --Discussed bone marrow biopsy would be next step as etiology is unknown. Since anemia is mild and patient's symptoms are improving, we will monitor for now. Consider bone marrow biopsy if anemia worsens.  --RTC in 6 months with labs.   No orders of the defined types were placed in this encounter.   All questions were answered. The patient knows to call the clinic with any problems, questions or concerns.  I have spent a total of 25 minutes minutes of face-to-face and non-face-to-face time, preparing to see the patient, performing a medically appropriate examination, counseling and educating the patient, documenting clinical information in the electronic health record, independently interpreting results and care coordination.   Dede Query, PA-C Department of Hematology/Oncology [place] at [facility] Phone: [phone]

## 2021-03-20 NOTE — Telephone Encounter (Signed)
Lab orders entered

## 2021-03-20 NOTE — Progress Notes (Deleted)
[place] Telephone:(336) [phone]   Fax:([phone]  PROGRESS NOTE  Patient Care Team: Aaron Limbo, [facility] as PCP - General (Family Medicine) Aaron Berthold, DO as Consulting Physician (Neurology) Aaron David, RN as [place] Management  Hematological/Oncological History 1) Labs from PCP, Aaron Hartshorn NP -07/03/2020: WBC 6.1, Hgb 12.6, MCV 101.4, Plt 343 -09/11/2020: WBC 7.1, Hgb 12.6, MCV 101.9, Plt 286 -11/06/2020:WBC 5.7, Hgb 11.8 , MCV 98.0, Plt 232  2) 11/01/2020: EUS/EGD: No gross lesions. Salmon-colored mucosa present from 36-37 cm, Large 6 cm hiatal hernia present, gastritis in antrum/body. Pancreatic parenchymal abnormalities consisting of atrophy, lobularity with honeycombing, hyperechoic foci without shadowing and hyperechoic strands were noted in the entire pancreas.  3) 12/03/2020: Establish care with Aaron Aaron  CHIEF COMPLAINT: Normocytic Anemia  HISTORY OF PRESENTING ILLNESS:  Aaron Aaron 75 y.o. male returns for a follow up for anemia. Patient is accompanied by his sister for this visit. He was last seen in clinic on 12/03/2020.   On exam today, Aaron Aaron reports that his energy levels have improved     chronic fatigue for the last 6 months.  He currently goes to physical therapy twice a week and stroke in November 2021.  He does note improvement in his strength and ability to ambulate with physical therapy.  Patient has lost approximately 30 pounds in the last year that was unintentional.  He is unable to regain any weight.  He was recently started on pancreatic enzymes by the gastroenterology team without any improvement.  Patient denies any nausea, vomiting or abdominal pain.  He denies any postprandial cramping or discomfort.  Patient has regular bowel movements without any constipation or diarrhea.  He denies easy bruising or signs of bleeding.  This includes hematochezia, melena, hematuria, epistaxis,  hemoptysis or gingival bleeding.  Patient reports having bilateral tingling in his fingers and feet.  He does have swelling in his left foot greater than his right foot. Patient has a left foot drop and wears a brace during the day. He uses a walker to ambulate.  Patient denies any fevers, chills, night sweats, shortness of breath, chest pain, cough.  He has no other complaints.  Rest of the 10 point ROS is below.  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   Diverticulosis    Hiatal hernia    Hyponatremia    Myocardial infarction (Aaron Aaron)    Neuropathy    feet   Stroke (cerebrum) (Aaron Aaron) 2021   Dx at Aaron Aaron visit in 05-2020    SURGICAL HISTORY: Past Surgical History:  Procedure Laterality Date   BIOPSY  11/01/2020   Procedure: BIOPSY;  Surgeon: Aaron Copas., [place];  Location: [facility] ENDOSCOPY;  Service: Endoscopy;;   ESOPHAGOGASTRODUODENOSCOPY     ESOPHAGOGASTRODUODENOSCOPY (EGD) WITH PROPOFOL N/A 11/01/2020   Procedure: ESOPHAGOGASTRODUODENOSCOPY (EGD) WITH PROPOFOL;  Surgeon: Aaron Copas., [place];  Location: [facility] ENDOSCOPY;  Service: Endoscopy;  Laterality: N/A;   EUS N/A 11/01/2020   Procedure: UPPER ENDOSCOPIC ULTRASOUND (EUS) RADIAL;  Surgeon: Aaron Aaron;  Location: [place];  Service: Endoscopy;  Laterality: N/A;   HERNIA REPAIR     umbilical in the 84'Y    SOCIAL HISTORY: Social History   Socioeconomic History  Marital status: Divorced    Spouse name: Not on file   Number of children: 0   Years of education: Not on file   Highest education level: Not on file  Occupational History   Occupation: on leave from Aaron Aaron  Tobacco Use   Smoking status: Never   Smokeless tobacco: Never  Vaping Use   Vaping Use: Never used  Substance and  Sexual Activity   Alcohol use: Never    Comment: stopped 12/15/1979   Drug use: Never   Sexual activity: Not on file  Other Topics Concern   Not on file  Social History Narrative   Right Handed   One Story Home    Drinks caffeine   Social Determinants of Health   Financial Resource Strain: Not on file  Food Insecurity: No Food Insecurity   Worried About Charity fundraiser in the Last Year: Never true   [place] in the Last Year: Never true  Transportation Needs: No Transportation Needs   Aaron of Transportation (Medical): No   Aaron of Transportation (Non-Medical): No  Physical Activity: Not on file  Stress: Not on file  Social Connections: Not on file  Intimate Partner Violence: Not on file    FAMILY HISTORY: Family History  Problem Relation Age of Onset   COPD Father    Colon cancer Neg Hx    Esophageal cancer Neg Hx    Stomach cancer Neg Hx    Rectal cancer Neg Hx     ALLERGIES:  has No Known Allergies.  MEDICATIONS:  Current Outpatient Medications  Medication Sig Dispense Refill   carvedilol (COREG) 3.125 MG tablet Take 3.125 mg by mouth 2 (two) times daily.     CVS CALCIUM 600 MG tablet Take 600 mg by mouth 2 (two) times daily.     ENTRESTO 24-26 MG Take 1 tablet by mouth 2 (two) times daily.     furosemide (LASIX) 20 MG tablet Take 20 mg by mouth daily.     glipiZIDE (GLUCOTROL) 5 MG tablet Take 5 mg by mouth daily before breakfast.     Multiple Vitamin (MULTIVITAMIN WITH MINERALS) TABS tablet Take 1 tablet by mouth daily. 90 tablet 1   pantoprazole (PROTONIX) 40 MG tablet Take 1 tablet (40 mg total) by mouth daily. 30 tablet 5   sodium chloride 1 g tablet TAKE 1 TABLET (1 G TOTAL) BY MOUTH THREE TIMES DAILY WITH MEALS. (Patient taking differently: Take 1 g by mouth 3 (three) times daily with meals.) 21 tablet 0   spironolactone (ALDACTONE) 25 MG tablet Take 12.5 mg by mouth daily.     Thiamine HCl (VITAMIN B-1) 250 MG tablet Take 250 mg by mouth daily.      vitamin B-12 (CYANOCOBALAMIN) 1000 MCG tablet Take 1,000 mcg by mouth daily.     vitamin C (ASCORBIC ACID) 500 MG tablet Take 500 mg by mouth daily.     No current facility-administered medications for this visit.    REVIEW OF SYSTEMS:   Constitutional: ( - ) fevers, ( - )  chills , ( - ) night sweats Eyes: ( - ) blurriness of vision, ( - ) double vision, ( - ) watery eyes Ears, nose, mouth, throat, and face: ( - ) mucositis, ( - ) sore throat Respiratory: ( - ) cough, ( - ) dyspnea, ( - ) wheezes Cardiovascular: ( - ) palpitation, ( - ) chest discomfort, ( + ) lower extremity swelling Gastrointestinal:  ( - )  nausea, ( - ) heartburn, ( - ) change in bowel habits Skin: ( - ) abnormal skin rashes Lymphatics: ( - ) new lymphadenopathy, ( - ) easy bruising Neurological: ( - ) numbness, ( + ) tingling, ( - ) new weaknesses Behavioral/Psych: ( - ) mood change, ( - ) new changes  All other systems were reviewed with the patient and are negative.  PHYSICAL EXAMINATION: ECOG PERFORMANCE STATUS: 1 - Symptomatic but completely ambulatory  Vitals:   03/20/21 1439  BP: 110/69  Pulse: 81  Resp: 17  Temp: 97.9 F (36.6 C)  SpO2: 98%   Filed Weights   03/20/21 1439  Weight: 121 lb 1.6 oz (54.9 kg)    GENERAL: thin male in NAD  SKIN: skin color, texture, turgor are normal, no rashes or significant lesions EYES: conjunctiva are pink and non-injected, sclera clear OROPHARYNX: no exudate, no erythema; lips, buccal mucosa, and tongue normal. Poor dentition.  NECK: supple, non-tender LYMPH:  no palpable lymphadenopathy in the cervical, axillary or supraclavicular lymph nodes.  LUNGS: clear to auscultation and percussion with normal breathing effort HEART: regular rate & rhythm and no murmurs and no lower extremity edema ABDOMEN: soft, non-tender, non-distended, normal bowel sounds Musculoskeletal: no cyanosis of digits and no clubbing  PSYCH: alert & oriented x 3, fluent speech NEURO: no  focal motor/sensory deficits  LABORATORY DATA:  I have reviewed the data as listed CBC Latest Ref Rng & Units 03/20/2021 12/03/2020 10/12/2020  WBC 4.0 - 10.5 K/uL 7.4 6.6 6.8  Hemoglobin 13.0 - 17.0 g/dL 12.0(L) 11.8(L) 12.9(L)  Hematocrit 39.0 - 52.0 % 34.6(L) 33.5(L) 37.0(L)  Platelets 150 - 400 K/uL 207 180 215.0    CMP Latest Ref Rng & Units 12/03/2020 10/12/2020 07/23/2020  Glucose 70 - 99 mg/dL 125(H) 180(H) 132(H)  BUN 8 - 23 mg/dL 20 25(H) 19  Creatinine 0.61 - 1.24 mg/dL 0.80 0.69 0.79  Sodium 135 - 145 mmol/L 141 139 135  Potassium 3.5 - 5.1 mmol/L 3.2(L) 3.5 3.8  Chloride 98 - 111 mmol/L 104 103 101  CO2 22 - 32 mmol/L _0 Calcium 8.9 - 10.3 mg/dL 9.6 9.9 9.5  Total Protein 6.5 - 8.1 g/dL 7.1 - -  Total Bilirubin 0.3 - 1.2 mg/dL 0.7 - -  Alkaline Phos 38 - 126 U/L 62 - -  AST 15 - 41 U/L 17 - -  ALT 0 - 44 U/L 19 - -   ASSESSMENT & PLAN Jareb Radoncic is a 75 y.o. male presenting to the clinic for evaluation for anemia. I reviewed most recent outside labs from 11/06/2020 with Hgb 11.8 and MCV 98.0. Prior to that, there was evidence of macrocytosis and patient reports taking vitamin B12 once daily. Patient denies any signs of bleeding and most recent EGD from 11/01/2020 did not reveal any evidence of bleeding. Patient is scheduled for a colonoscopy in June 2022. I recommend proceeding with a thorough workup which includes CBC, CMP, iron and TIBC, retic panel, ferritin, vitamin B12, homocysteine, methylmalonic acid, folate and SPEP.   #Macrocytic Anemia: --Patient currently takes vitamin B12 1000 mcg once daily.  --Patient denies any signs of bleeding but plans to have a colonoscopy next month --Recommend labs today to check CBC, CMP, iron and TIBC, retic panel, ferritin, vitamin B12, homocysteine, methylmalonic acid, folate and SPEP.  --RTC based on above workup.   No orders of the defined types were placed in this encounter.   All questions were answered. The patient  knows to call the clinic with any problems, questions or concerns.  I have spent a total of 60 minutes minutes of face-to-face and non-face-to-face time, preparing to see the patient, obtaining and/or reviewing separately obtained history, performing a medically appropriate examination, counseling and educating the patient, ordering tests, documenting clinical information in the electronic health record and care coordination.   [place]aron Query, [place] Department of Hematology/Oncology [place] at [facility] Phone: [phone]  Patient was seen with Dr. Lorenso Courier.   I have read the above note and personally examined the patient. I agree with the assessment and plan as noted above.  Briefly Mr. Vivanco is a 75 year old male with medical history significant for macrocytic anemia who presents for evaluation.  The patient was diagnosed with vitamin B12 deficiency and is been taking vitamin B12 supplementation since that time.  He presents today for evaluation of his persistently low hemoglobin.  Today we will assure that the p.o. supplementation of vitamin B12 has been adequate with methylmalonic acid, homocystine, and vitamin B12 levels.  Additionally we will look for other possible causes including iron deficiency or hypoproduction.  Colonoscopy is already scheduled for next month.  If no clear etiology can be found for this patient's anemia would need to consider bone marrow biopsy.   Ledell Peoples, [place] Department of Hematology/Oncology [place] at [facility] Phone: [phone] Pager: [phone] Email: [email or web address]

## 2021-03-21 DIAGNOSIS — E291 Testicular hypofunction: Secondary | ICD-10-CM | POA: Diagnosis not present

## 2021-03-25 ENCOUNTER — Other Ambulatory Visit: Payer: Self-pay | Admitting: *Deleted

## 2021-03-25 ENCOUNTER — Ambulatory Visit: Payer: Self-pay | Admitting: *Deleted

## 2021-03-25 NOTE — Patient Outreach (Signed)
[employer] [facility]) Care Management  03/25/2021  Aaron [place] 1945/12/01 [id number]   CMA received call from member requesting to cancel today's outreach and to be removed from program.  Will close case at this time per member's request.  Valente David, RN, MSN [place] [phone]

## 2021-04-04 DIAGNOSIS — I429 Cardiomyopathy, unspecified: Secondary | ICD-10-CM | POA: Diagnosis not present

## 2021-04-08 ENCOUNTER — Telehealth: Payer: Self-pay

## 2021-04-08 DIAGNOSIS — R933 Abnormal findings on diagnostic imaging of other parts of digestive tract: Secondary | ICD-10-CM

## 2021-04-08 DIAGNOSIS — K869 Disease of pancreas, unspecified: Secondary | ICD-10-CM

## 2021-04-08 DIAGNOSIS — K861 Other chronic pancreatitis: Secondary | ICD-10-CM

## 2021-04-08 NOTE — Telephone Encounter (Signed)
Lm on vm for patient to return call 

## 2021-04-08 NOTE — Telephone Encounter (Signed)
-----   Message from Yevette Edwards, RN sent at 02/14/2021 10:29 AM EDT ----- Regarding: FW: MRCP Per 11/2020 office note, pt is due in 10/22 ----- Message ----- From: Yevette Edwards, RN Sent: 02/14/2021  12:00 AM EDT To: Yevette Edwards, RN Subject: MRCP                                           Repeat MRCP - pancreatic duct dilation, renal cysts  See 08/15/20 MRCP result note for more information

## 2021-04-08 NOTE — Telephone Encounter (Signed)
Spoke with patient to remind him that he is due for routine MRCP at this time. Advised that we have placed the order and radiology scheduling will contact him directly to set up his appt. Patient verbalized understanding and had no concerns at the end of the call.  MRCP order in epic. Secure staff message sent to radiology scheduling to set up appt.

## 2021-04-09 NOTE — Telephone Encounter (Signed)
Spoke with patient in regards to CT results and recommendations. Pt was thankful that we could use CT that he already had. Pt has been scheduled for a follow up with Dr. Havery Moros on Thursday, 05/23/21 at 3 PM. Pt verbalized understanding of all information and had no concerns at the end of the call.

## 2021-04-09 NOTE — Telephone Encounter (Signed)
Pt called in and stated that he had a CT of the chest/abdomen/pelvis with [facility] on 02/20/21. Report is in care everywhere. Please advise, thanks.

## 2021-04-09 NOTE — Telephone Encounter (Signed)
Scan reviewed.  For my purposes we wanted to re-evaluate his pancreas and they say it looks good without ductal dilation which is reassuring.  He does have a pulmonary nodule that has slightly increased in size since the last exam and radiology recommends a 6 month follow up CT. He also has a benign appearing renal cyst. These 2 issues can be followed / monitored by his primary care.  I would like to see him back in the office in a few months for reassessment if you can help coordinate. Thanks

## 2021-04-09 NOTE — Telephone Encounter (Signed)
From: Maryann Conners, [place]  Sent: 04/09/2021   9:07 AM EDT  To: April H Pait, Roosvelt Maser, *  Subject: RE: MRCP                                       FYI...   04/09/21- Pt stated that he will contact the office sometime this week to have appt sched after he looks at his avail dates.Ellison Hughs   ----- Message -----  From: Belva Chimes H  Sent: 04/08/2021   2:14 PM EDT  To: Maryann Conners, NT  Subject: FW: MRCP                                        ----- Message -----  From: Yevette Edwards, RN  Sent: 04/08/2021   2:07 PM EDT  To: April H Pait, Roosvelt Maser  Subject: MRCP                                           Please schedule patient for MRI/MRCP of the abdomen at [facility]. The order is in epic. Thanks

## 2021-04-11 DIAGNOSIS — E1159 Type 2 diabetes mellitus with other circulatory complications: Secondary | ICD-10-CM | POA: Diagnosis not present

## 2021-04-11 DIAGNOSIS — Z0000 Encounter for general adult medical examination without abnormal findings: Secondary | ICD-10-CM | POA: Diagnosis not present

## 2021-04-11 DIAGNOSIS — I152 Hypertension secondary to endocrine disorders: Secondary | ICD-10-CM | POA: Diagnosis not present

## 2021-04-11 DIAGNOSIS — I5032 Chronic diastolic (congestive) heart failure: Secondary | ICD-10-CM | POA: Diagnosis not present

## 2021-04-11 DIAGNOSIS — E1144 Type 2 diabetes mellitus with diabetic amyotrophy: Secondary | ICD-10-CM | POA: Diagnosis not present

## 2021-04-11 DIAGNOSIS — Z23 Encounter for immunization: Secondary | ICD-10-CM | POA: Diagnosis not present

## 2021-04-11 DIAGNOSIS — E871 Hypo-osmolality and hyponatremia: Secondary | ICD-10-CM | POA: Diagnosis not present

## 2021-04-11 DIAGNOSIS — M21372 Foot drop, left foot: Secondary | ICD-10-CM | POA: Diagnosis not present

## 2021-04-11 DIAGNOSIS — K219 Gastro-esophageal reflux disease without esophagitis: Secondary | ICD-10-CM | POA: Diagnosis not present

## 2021-04-11 DIAGNOSIS — Z125 Encounter for screening for malignant neoplasm of prostate: Secondary | ICD-10-CM | POA: Diagnosis not present

## 2021-04-12 ENCOUNTER — Encounter: Payer: Self-pay | Admitting: Neurology

## 2021-04-17 ENCOUNTER — Other Ambulatory Visit: Payer: Self-pay

## 2021-04-17 DIAGNOSIS — R9389 Abnormal findings on diagnostic imaging of other specified body structures: Secondary | ICD-10-CM

## 2021-04-17 NOTE — Progress Notes (Signed)
Due for CT of Chest  due to  abnormal CT of chest in April.  Per SA: "scarring and nodules of your lungs in the top portion but no concerning evidence of cancer or anything bad to cause your weight loss. The radiologist is recommending a follow up CT scan in 6-12 months to reassess this however and ensure okay."

## 2021-04-22 NOTE — Telephone Encounter (Signed)
Patient returned your call. He wants to be sure the CT scan is needed. States he just had one at [facility] and wants to discuss about a new one

## 2021-04-22 NOTE — Telephone Encounter (Signed)
Per Central Scheduling, patient declined CT Chest.  Patient has an appointment with Dr. Havery Moros next month and can discuss with Dr. Havery Moros at that visit.

## 2021-05-23 ENCOUNTER — Ambulatory Visit: Payer: Medicare HMO | Admitting: Gastroenterology

## 2021-05-23 ENCOUNTER — Encounter: Payer: Self-pay | Admitting: Gastroenterology

## 2021-05-23 DIAGNOSIS — K861 Other chronic pancreatitis: Secondary | ICD-10-CM | POA: Diagnosis not present

## 2021-05-23 DIAGNOSIS — R634 Abnormal weight loss: Secondary | ICD-10-CM

## 2021-05-23 DIAGNOSIS — R933 Abnormal findings on diagnostic imaging of other parts of digestive tract: Secondary | ICD-10-CM

## 2021-05-23 DIAGNOSIS — K2270 Barrett's esophagus without dysplasia: Secondary | ICD-10-CM

## 2021-05-23 DIAGNOSIS — D649 Anemia, unspecified: Secondary | ICD-10-CM | POA: Diagnosis not present

## 2021-05-23 DIAGNOSIS — R9389 Abnormal findings on diagnostic imaging of other specified body structures: Secondary | ICD-10-CM | POA: Diagnosis not present

## 2021-05-23 NOTE — Patient Instructions (Signed)
If you are age 73 or older, your body mass index should be between 23-30. Your Body mass index is 18.55 kg/m. If this is out of the aforementioned range listed, please consider follow up with your Primary Care Provider. ________________________________________________________  The  GI providers would like to encourage you to use [facility] to communicate with providers for non-urgent requests or questions.  Due to long hold times on the telephone, sending your provider a message by [facility] may be a faster and more efficient way to get a response.  Please allow 48 business hours for a response.  Please remember that this is for non-urgent requests.  _______________________________________________________  Dennis Bast will be contacted by [facility] Scheduling in the next 2 days to arrange a MRCP.  The number on your caller ID will be [phone], please answer when they call.  If you have not heard from them in 2 days please call [phone] to schedule.  You have been scheduled for an MRCP at  [facility] on ______________________. Your appointment time is ____________. Please arrive to admitting (at main entrance of the hospital) 15 minutes prior to your appointment time for registration purposes. Please make certain not to have anything to eat or drink 6 hours prior to your test. In addition, if you have any metal in your body, have a pacemaker or defibrillator, please be sure to let your ordering physician know. This test typically takes 45 minutes to 1 hour to complete. Should you need to reschedule, please call [phone] to do so.  Due to recent changes in healthcare laws, you may see the results of your imaging and laboratory studies on MyChart before your provider has had a chance to review them.  We understand that in some cases there may be results that are confusing or concerning to you. Not all laboratory results come back in the same time frame and the provider may be  waiting for multiple results in order to interpret others.  Please give [name] 48 hours in order for your provider to thoroughly review all the results before contacting the office for clarification of your results.    Thank you for entrusting me with your care and for choosing [facility], Dr. [place]

## 2021-05-23 NOTE — Progress Notes (Signed)
HPI :  75 year old male here for follow-up visit for weight loss, suspected chronic pancreatitis.   Recall that he went to the ED in early November 2021 due to progressive lower extremity weakness and inability to walk. During that time he endorsed 30 lbs of weight loss, poor appetite. He had extensive imaging at that visit in the ER to include CT scan of the head, CT scan of the chest abdomen pelvis, CTA of the neck with and without contrast.  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  He had very mild dilation of the main pancreatic duct to 4 to 5 mm in diameter.  No pancreatic mass lesions.  Some apical pulmonary nodules, small angiomyolipoma of the left kidney. He was empirically placed on Protonix 49m. He follows chronically with cardiology, he has a history of CAD and CHF, on Entresto.  He was extensively worked up by neurology and found to have new onset diabetes with a hemoglobin A1c of 9.6.  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  Since he has been on therapy for this his blood sugars have been much better controlled.  He is working with physical therapy regarding his ambulatory status.  During his course he was admitted in December with hyponatremia and was placed on salt tablet 3 times daily which he continues.  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  He was found to have a UTI during that time.   His workup with me has included an upper endoscopy 07/2020. 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  Otherwise no other concerning findings.  Gastric biopsies negative for H. pylori.  He also had an MRCP to further clarify pancreatic findings, this was done in February which showed persistent mild dilation of the main pancreatic duct without  any mass lesions. This led to EUS with Dr. MRush Landmarkwith findings as below, suggestive of chronic pancreatitis. Fecal elastase which was 426, normal.  Since he last saw me in the office he underwent a colonoscopy which did not show any high risk pathology, few small polyps removed.  He was also referred to hematology for ongoing anemia.  They did not find any clear cause, have held off on bone marrow biopsy.  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  He was given a trial of pancreatic enzymes since his last visit with u[place] states it did not do anything.  In general he has been doing much better.  He states his diabetes is better, he has regained 10 pounds.  He is continue to work with PT and his gait is much better.  [facility]  He is eating well.  He denies any reflux symptoms on Protonix 40 mg daily.  He is not smoking any tobacco or drinking any alcohol.  He states he drank heavily for short period of time in his life but has not drank at all on for years.  He denies any abdominal pains at this time.  No bowel problems.  [facility] a small [facility] nod[facility] and they recommended a repeat CT scan in 6 months.  Dr. MRush Landmarkhad recommended a follow-up MRCP 6 months after his EUS to reassess his pancreas.  Generally he has done much better since we initially met him, following his extensive evaluation.  Overall it is thought that diabetes has caused the majority of his symptoms    Prior workup: CT abdomen / pelvis 05/17/20 - IMPRESSION: 1. Large hiatal hernia. Mild circumferential distal esophageal thickening, may represent esophagitis. 2. Potential fat containing lesion in the LEFT kidney may represent a small angiomyolipoma. Consider follow-up noncontrast CT on a nonemergent basis for complete characterization. 3. Mild dilation of the main pancreatic duct in the head of the pancreas with smooth tapering  distally, correlate with current evidence of or prior history of pancreatitis. No peripancreatic stranding on today's study. 4-69m main dilation of PD 4. Apical pulmonary nodules in the setting of pleural and parenchymal scarring are of uncertain significance. Largest approximately 8 mm. Non-contrast chest CT at 3-6 months is recommended. If the nodules are stable at time of repeat CT, then future CT at 18-24 months (from today's scan) is considered optional for low-risk patients, but is recommended for high-risk patients. This recommendation follows the consensus statement: Guidelines for Management of Incidental Pulmonary Nodules Detected on CT Images: From the Fleischner Society 2017; Radiology 2017; 284:228-243. 5. Normal appendix. 6. Calcified coronary artery disease. 7. Aortic atherosclerosis.   Aortic Atherosclerosis (ICD10-I70.0).   CT head - 05/17/20 - IMPRESSION: Small infarcts within the bilateral cerebellar hemispheres are favored subacute or chronic given the degree of hypodensity. Brain MRI may be obtained for confirmation, as clinically warranted.   Otherwise unremarkable non-contrast CT appearance of the brain for Age.    EGD 07/27/20 -  - A 7 cm hiatal hernia was present. - The Z-line was irregular with a 10-145mtongue of salmon colored mucosa without nodularity. Biopsies were taken with a cold forceps for histology. - The examined esophagus was tortuous. - The exam of the esophagus was otherwise normal. - The entire examined stomach was normal other than some very mild focal areas of erythema. No ulcers or mass lesions. Biopsies were taken with a cold forceps for Helicobacter pylori testing. - The duodenal bulb and second portion of the duodenum were normal.   1. Surgical [P], gastric antrum and gastric body - MILD REACTIVE GASTROPATHY. - Hinton DyerS NEGATIVE FOR HELICOBACTER PYLORI. - NO INTESTINAL METAPLASIA, DYSPLASIA, OR MALIGNANCY. 2. Surgical  [P], esophagus, GE junction - INTESTINAL METAPLASIA CONSISTENT WITH BARRETT'S ESOPHAGUS. - NO DYSPLASIA OR MALIGNANCY.     MRCP 08/15/20 - IMPRESSION: 1. No acute findings within the abdomen. 2. Unchanged mild dilatation of the main pancreatic duct at the level of the head of pancreas. No mass identified. Recommend follow-up imaging in 6-12 months to ensure ongoing stability. 3. Bilateral kidney cysts. There is a septated cyst within the inferior pole of the right kidney. 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 Attention to this lesion on follow-up imaging is advised. 4. Small hiatal hernia. 5.  Aortic Atherosclerosis (ICD10-I70.0).     CT chest 10/26/20 - IMPRESSION: 1. Stable biapical pleural and parenchymal scarring changes and apical nodularity. No discrete apical mass. Recommend follow-up noncontrast chest CT in 6-12 months. 2. No mediastinal or hilar mass or adenopathy. 3. Stable large hiatal hernia. 4. Stable atherosclerotic calcifications involving the aorta and coronary arteries. 5. Aortic atherosclerosis.    EUS 11/01/20:EGD Impression: - No gross lesions in esophagus proximally. - Salmon-colored mucosa suspicious for Barrett's esophagus distally. Biopsied. - Z-line irregular, 37 cm from the incisors. - Large hiatal hernia. - Gastritis in antrum/body. Otherwise, no gross lesions in  the stomach. Biopsied. - No gross lesions in the duodenal bulb, in the first portion of the duodenum and in the second portion of the duodenum. - Normal ampulla. EUS Impression: - Pancreatic parenchymal abnormalities consisting of atrophy, lobularity with honeycombing, hyperechoic foci without shadowing and hyperechoic strands were noted in the entire pancreas. - The pancreatic duct had a dilated endosonographic appearance in the pancreatic head, genu of the pancreas and body of the pancreas. - The pancreatic duct had an irregularly contoured ansa loop in the genu/body region  of the pancreas. - No evidence of pancreatic mass noted throughout the examination - though with chronic pancreatitis changes, sensitivity of EUS decreases for mass/lesions. - There was no sign of significant pathology in the common bile duct and in the common hepatic duct. - Hyperechoic material consistent with sludge was visualized endosonographically in the gallbladder and a possible non-shadowing stone vs polyp (<3 mm in size) noted. - No malignant-appearing lymph nodes were visualized in the celiac region (level 20), peripancreatic region and porta hepatis region.    Colonoscopy 12/12/20 - The perianal and digital rectal examinations were normal. - The terminal ileum appeared normal. - Two flat and sessile polyps were found in the sigmoid colon. The polyps were 4 to 7 mm in size. These polyps were removed with a cold snare. Resection and retrieval were complete. - Scattered medium-mouthed diverticula were found in the entire colon. - Internal hemorrhoids were found during retroflexion. The hemorrhoids were small. - The exam was otherwise without abnormality.  Surgical [P], colon, sigmoid, polyp (2) - TUBULAR ADENOMA (1 OF 5 FRAGMENTS) - HYPERPLASTIC POLYP (4 OF 5 FRAGMENTS) - NO HIGH-GRADE DYSPLASIA OR MALIGNANCY IDENTIFIED  Repeat in 7 years?    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   Dx at hosp visit in 05-2020     Past Surgical History:  Procedure Laterality Date   BIOPSY  11/01/2020   Procedure: BIOPSY;  Surgeon: Irving Copas., MD;  [facility] END[facility]COPY;  Service: Endoscopy;;   ESOPHAGOGASTRODUODENOSCOPY     ESOPHAGOGASTRODUODENOSCOPY  (EGD) WITH PROPOFOL N/A 11/01/2020   Procedure: ESOPHAGOGASTRODUODENOSCOPY (EGD) WITH PROPOFOL;  Surgeon: Irving Copas., MD;  Location: [facility] ENDOSCOPY;  Service: Endoscopy;  Laterality: N/A;   EUS N/A 11/01/2020   Procedure: UPPER ENDOSCOPIC ULTRASOUND (EUS) RADIAL;  Surgeon: Rush Landmark Telford Nab., MD;  Location: [place];  Service: Endoscopy;  Laterality: N/A;   HERNIA REPAIR     umbilical in the 47'Q   Family History  Problem Relation Age of Onset   COPD Father    Colon cancer Neg Hx    Esophageal cancer Neg Hx    Stomach cancer Neg Hx    Rectal cancer Neg Hx    Pancreatic cancer Neg Hx    Social History   Tobacco Use   Smoking status: Never   Smokeless tobacco: Never  Vaping Use   Vaping Use: Never used  Substance Use Topics   Alcohol use: Never    Comment: stopped 12/14/1980   Drug use: Never   Current Outpatient Medications  Medication Sig Dispense Refill  carvedilol (COREG) 3.125 MG tablet Take 3.125 mg by mouth 2 (two) times daily.     [employer] CALCIUM 600 MG tablet Take 600 mg by mouth 2 (two) times daily.     Ensure Plus (ENSURE PLUS) LIQD Take 237 mLs by mouth 2 (two) times daily between meals.     ENTRESTO 24-26 MG Take 1 tablet by mouth 2 (two) times daily.     furosemide (LASIX) 20 MG tablet Take 20 mg by mouth daily.     glipiZIDE (GLUCOTROL) 5 MG tablet Take 5 mg by mouth daily before breakfast.     Multiple Vitamin (MULTIVITAMIN WITH MINERALS) TABS tablet Take 1 tablet by mouth daily. 90 tablet 1   pantoprazole (PROTONIX) 40 MG tablet Take 1 tablet (40 mg total) by mouth daily. 30 tablet 5   sodium chloride 1 g tablet TAKE 1 TABLET (1 G TOTAL) BY MOUTH THREE TIMES DAILY WITH MEALS. 21 tablet 0   spironolactone (ALDACTONE) 25 MG tablet Take 12.5 mg by mouth daily.     Testosterone 12.5 MG/ACT (1%) GEL Apply 1 pump into each shoulder once daily.  Allow 5 minutes to dry     Thiamine HCl (VITAMIN B-1) 250 MG tablet Take 250 mg by mouth daily.     vitamin  B-12 (CYANOCOBALAMIN) 1000 MCG tablet Take 1,000 mcg by mouth daily.     vitamin C (ASCORBIC ACID) 500 MG tablet Take 500 mg by mouth daily.     No current facility-administered medications for this visit.   No Known Allergies   Review of Systems: All systems reviewed and negative except where noted in HPI.   Lab Results  Component Value Date   WBC 7.4 03/20/2021   HGB 12.0 (L) 03/20/2021   HCT 34.6 (L) 03/20/2021   MCV 98.0 03/20/2021   PLT 207 03/20/2021    Lab Results  Component Value Date   CREATININE 0.80 12/03/2020   BUN 20 12/03/2020   NA 141 12/03/2020   K 3.2 (L) 12/03/2020   CL 104 12/03/2020   CO2 28 12/03/2020    Lab Results  Component Value Date   ALT 19 12/03/2020   AST 17 12/03/2020   ALKPHOS 62 12/03/2020   BILITOT 0.7 12/03/2020     Physical Exam: BP 108/62   Pulse 76   Ht 5' 11"  (1.803 m)   Wt 133 lb (60.3 kg)   SpO2 99%   BMI 18.55 kg/m  Constitutional: Pleasant,well-developed,male in no acute distress. Abdominal: Soft, nondistended, nontender.  There are no masses palpable.  Extremities: no edema Lymphadenopathy: No cervical adenopathy noted. Neurological: Alert and oriented to person place and time. Skin: Skin is warm and dry. No rashes noted. Psychiatric: Normal mood and affect. Behavior is normal.   ASSESSMENT AND PLAN: 75 year old male here for reassessment following:  Loss of weight Abnormal MRCP / EUS Chronic pancreatitis Barrett's esophagus Anemia Abnormal CT chest  History as detailed above.  Extensive evaluation for multiple problems including traumatic weight loss upon presentation.  Ultimately found to have new onset diabetes leading to severe neurologic symptoms.  During the course of his work-up we have found that he likely has chronic pancreatitis although unclear how much of this actually played a role in his presentation, could have been incidental finding.  He does have a short segment of Barrett's esophagus and a  very large hiatal hernia, reflux not controlled on Protonix.  He has an ongoing anemia for which he is seeing hematology, appears stable, he has  held off on bone marrow biopsy up to this point.  We are following up an abnormal MRCP/EUS with pancreatic ductal dilation.  Dr. Rush Landmark had recommended repeat MRCP 6 months from the EUS, so he is due for that now.  He wishes to proceed with that now after discussion of options.  He is due for repeat CT scan of his chest in February for pulmonary nodules, had his last imaging done at [place] over the summer.  I can order this for him in February or he can see his primary care about this who will follow this long-term.  Overall gaining weight and clinically doing much better since of last seen him which is very reassuring.  No obvious evidence of any malignancy up to this point.  Plan: - schedule MRCP - continue protonix - CT chest February - monitor Hgb, holding off on bone marrow biopsy for now  Follow up in 6 months or sooner with any acute concerns.  Jolly Mango, MD [facility]

## 2021-05-29 ENCOUNTER — Ambulatory Visit: Payer: Medicare HMO | Admitting: Neurology

## 2021-05-31 ENCOUNTER — Other Ambulatory Visit: Payer: Self-pay | Admitting: Gastroenterology

## 2021-05-31 ENCOUNTER — Ambulatory Visit (HOSPITAL_COMMUNITY)
Admission: RE | Admit: 2021-05-31 | Discharge: 2021-05-31 | Disposition: A | Discharge status: Home / Self Care | Payer: Medicare HMO | Source: Ambulatory Visit | Attending: Gastroenterology | Admitting: Gastroenterology

## 2021-05-31 DIAGNOSIS — K861 Other chronic pancreatitis: Secondary | ICD-10-CM | POA: Diagnosis not present

## 2021-05-31 DIAGNOSIS — N281 Cyst of kidney, acquired: Secondary | ICD-10-CM | POA: Diagnosis not present

## 2021-05-31 DIAGNOSIS — R9389 Abnormal findings on diagnostic imaging of other specified body structures: Secondary | ICD-10-CM

## 2021-05-31 DIAGNOSIS — N189 Chronic kidney disease, unspecified: Secondary | ICD-10-CM | POA: Diagnosis not present

## 2021-05-31 DIAGNOSIS — K449 Diaphragmatic hernia without obstruction or gangrene: Secondary | ICD-10-CM | POA: Diagnosis not present

## 2021-05-31 MED ORDER — GADOBUTROL 1 MMOL/ML IV SOLN
6.0000 mL | Freq: Once | INTRAVENOUS | Status: AC | PRN
  Administered 2021-05-31: 6 mL via INTRAVENOUS

## 2021-06-05 DIAGNOSIS — R7989 Other specified abnormal findings of blood chemistry: Secondary | ICD-10-CM | POA: Diagnosis not present

## 2021-06-14 DIAGNOSIS — R531 Weakness: Secondary | ICD-10-CM | POA: Diagnosis not present

## 2021-06-14 DIAGNOSIS — R200 Anesthesia of skin: Secondary | ICD-10-CM | POA: Diagnosis not present

## 2021-06-14 DIAGNOSIS — E1144 Type 2 diabetes mellitus with diabetic amyotrophy: Secondary | ICD-10-CM | POA: Diagnosis not present

## 2021-06-14 DIAGNOSIS — G541 Lumbosacral plexus disorders: Secondary | ICD-10-CM | POA: Diagnosis not present

## 2021-07-11 DIAGNOSIS — E1144 Type 2 diabetes mellitus with diabetic amyotrophy: Secondary | ICD-10-CM | POA: Diagnosis not present

## 2021-07-11 DIAGNOSIS — K219 Gastro-esophageal reflux disease without esophagitis: Secondary | ICD-10-CM | POA: Diagnosis not present

## 2021-07-11 DIAGNOSIS — E871 Hypo-osmolality and hyponatremia: Secondary | ICD-10-CM | POA: Diagnosis not present

## 2021-07-11 DIAGNOSIS — M21372 Foot drop, left foot: Secondary | ICD-10-CM | POA: Diagnosis not present

## 2021-07-11 DIAGNOSIS — I5032 Chronic diastolic (congestive) heart failure: Secondary | ICD-10-CM | POA: Diagnosis not present

## 2021-07-11 DIAGNOSIS — E291 Testicular hypofunction: Secondary | ICD-10-CM | POA: Diagnosis not present

## 2021-07-11 DIAGNOSIS — E1159 Type 2 diabetes mellitus with other circulatory complications: Secondary | ICD-10-CM | POA: Diagnosis not present

## 2021-07-11 DIAGNOSIS — I152 Hypertension secondary to endocrine disorders: Secondary | ICD-10-CM | POA: Diagnosis not present

## 2021-08-13 DIAGNOSIS — R7989 Other specified abnormal findings of blood chemistry: Secondary | ICD-10-CM | POA: Diagnosis not present

## 2021-09-16 ENCOUNTER — Other Ambulatory Visit: Payer: Self-pay | Admitting: Physician Assistant

## 2021-09-16 DIAGNOSIS — D649 Anemia, unspecified: Secondary | ICD-10-CM

## 2021-09-17 ENCOUNTER — Other Ambulatory Visit: Payer: Self-pay

## 2021-09-17 ENCOUNTER — Inpatient Hospital Stay: Payer: Medicare HMO

## 2021-09-17 ENCOUNTER — Inpatient Hospital Stay: Payer: Medicare HMO | Attending: Physician Assistant | Admitting: Physician Assistant

## 2021-09-17 VITALS — BP 126/74 | HR 83 | Temp 97.7°F | Resp 18 | Wt 141.1 lb

## 2021-09-17 DIAGNOSIS — D649 Anemia, unspecified: Secondary | ICD-10-CM | POA: Insufficient documentation

## 2021-09-17 DIAGNOSIS — E119 Type 2 diabetes mellitus without complications: Secondary | ICD-10-CM | POA: Insufficient documentation

## 2021-09-17 LAB — CBC WITH DIFFERENTIAL (CANCER CENTER ONLY)
Abs Immature Granulocytes: 0.01 10*3/uL (ref 0.00–0.07)
Basophils Absolute: 0.1 10*3/uL (ref 0.0–0.1)
Basophils Relative: 1 %
Eosinophils Absolute: 0.4 10*3/uL (ref 0.0–0.5)
Eosinophils Relative: 5 %
HCT: 39.1 % (ref 39.0–52.0)
Hemoglobin: 13.7 g/dL (ref 13.0–17.0)
Immature Granulocytes: 0 %
Lymphocytes Relative: 27 %
Lymphs Abs: 1.9 10*3/uL (ref 0.7–4.0)
MCH: 33.7 pg (ref 26.0–34.0)
MCHC: 35 g/dL (ref 30.0–36.0)
MCV: 96.1 fL (ref 80.0–100.0)
Monocytes Absolute: 0.5 10*3/uL (ref 0.1–1.0)
Monocytes Relative: 7 %
Neutro Abs: 4.3 10*3/uL (ref 1.7–7.7)
Neutrophils Relative %: 60 %
Platelet Count: 228 10*3/uL (ref 150–400)
RBC: 4.07 MIL/uL — ABNORMAL LOW (ref 4.22–5.81)
RDW: 13.2 % (ref 11.5–15.5)
WBC Count: 7.2 10*3/uL (ref 4.0–10.5)
nRBC: 0 % (ref 0.0–0.2)

## 2021-09-17 LAB — CMP (CANCER CENTER ONLY)
ALT: 18 U/L (ref 0–44)
AST: 16 U/L (ref 15–41)
Albumin: 4.3 g/dL (ref 3.5–5.0)
Alkaline Phosphatase: 72 U/L (ref 38–126)
Anion gap: 6 (ref 5–15)
BUN: 24 mg/dL — ABNORMAL HIGH (ref 8–23)
CO2: 28 mmol/L (ref 22–32)
Calcium: 10 mg/dL (ref 8.9–10.3)
Chloride: 101 mmol/L (ref 98–111)
Creatinine: 0.88 mg/dL (ref 0.61–1.24)
GFR, Estimated: >60 mL/min (ref >60)
Glucose, Bld: 198 mg/dL — ABNORMAL HIGH (ref 70–99)
Potassium: 4.3 mmol/L (ref 3.5–5.1)
Sodium: 135 mmol/L (ref 135–145)
Total Bilirubin: 0.5 mg/dL (ref 0.3–1.2)
Total Protein: 7.3 g/dL (ref 6.5–8.1)

## 2021-09-17 LAB — SAVE SMEAR(SSMR), FOR PROVIDER SLIDE REVIEW

## 2021-09-17 NOTE — Progress Notes (Signed)
[place] Telephone:(336) [phone]   Fax:(336) [phone]  PROGRESS NOTE  Patient Care Team: Bernerd Limbo, MD as PCP - General (Family Medicine) Alda Berthold, DO as Consulting Physician (Neurology)  Hematological/Oncological History 1) Labs from PCP, Bridget Hartshorn NP -07/03/2020: WBC 6.1, Hgb 12.6, MCV 101.4, Plt 343 -09/11/2020: WBC 7.1, Hgb 12.6, MCV 101.9, Plt 286 -11/06/2020:WBC 5.7, Hgb 11.8 , MCV 98.0, Plt 232  2) 11/01/2020: EUS/EGD: No gross lesions. Salmon-colored mucosa present from 36-37 cm, Large 6 cm hiatal hernia present, gastritis in antrum/body. Pancreatic parenchymal abnormalities consisting of atrophy, lobularity with honeycombing, hyperechoic foci without shadowing and hyperechoic strands were noted in the entire pancreas.  3) 12/03/2020: Establish care with Dede Query PA-C  4) 12/12/2020: Colonoscopy: Two 4 to 7 mm polyps in the sigmoid colon, removed with a cold snare. Resected and retrieved.Diverticulosis in the entire examined colon.Internal hemorrhoids.The examination was otherwise normal.  CHIEF COMPLAINT: Anemia  HISTORY OF PRESENTING ILLNESS:  Aaron Rush 76 y.o. male returns for a follow up for anemia. Patient is accompanied by his sister for this visit. He was last seen in clinic on 03/20/2021 and in the interim, he denies any changes to his health.   On exam today, [name] reports that his energy levels continue to improve and he is more active.  He is now ambulating using a cane.  His appetite is improving and he has progressively gained back approximately 20 pounds over the last 6 months.  He denies any nausea, vomiting or abdominal pain.  His bowel habits are stable without any diarrhea or constipation.  He denies easy bruising or signs of active bleeding.  Patient denies fevers, chills, night sweats, shortness of breath, chest pain or cough.  He has no other complaints.  Rest of the 10 point ROS is below.  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   Dx at [place] visit in 05-2020    SURGICAL HISTORY: Past Surgical History:  Procedure Laterality Date   BIOPSY  11/01/2020   Procedure: BIOPSY;  Surgeon: Irving Copas., MD;  Location: [facility] ENDOSCOPY;  Service: Endoscopy;;   ESOPHAGOGASTRODUODENOSCOPY     ESOPHAGOGASTRODUODENOSCOPY (EGD) WITH PROPOFOL N/A 11/01/2020   Procedure: ESOPHAGOGASTRODUODENOSCOPY (EGD) WITH PROPOFOL;  Surgeon: Irving Copas., MD;  Location: [facility] ENDOSCOPY;  Service: Endoscopy;  Laterality: N/A;   EUS N/A 11/01/2020   Procedure: UPPER ENDOSCOPIC ULTRASOUND (EUS) RADIAL;  Surgeon: Rush Landmark Telford Nab., MD;  Location: [place];  Service: Endoscopy;  Laterality: N/A;   HERNIA REPAIR     umbilical in the 63'J    SOCIAL HISTORY: Social History   Socioeconomic History   Marital status: Divorced    Spouse name: Not on file   Number of children: 0   Years of education: Not on file   Highest education level: Not on file  Occupational History   Occupation: on leave from [employer]  Tobacco Use   Smoking status: Never   Smokeless tobacco: Never  Vaping Use   Vaping Use: Never used  Substance and Sexual Activity  Alcohol use: Never    Comment: stopped 12/14/1980   Drug use: Never   Sexual activity: Not on file  Other Topics Concern   Not on file  Social History Narrative   Right Handed   One Story Home    Drinks caffeine   Social Determinants of Health   Financial Resource Strain: Not on file  Food Insecurity: No Food Insecurity   Worried About Charity fundraiser in the Last Year: Never true   Ran Out of Food in  the Last Year: Never true  Transportation Needs: No Transportation Needs   Rush of Transportation (Medical): No   Rush of Transportation (Non-Medical): No  Physical Activity: Not on file  Stress: Not on file  Social Connections: Not on file  Intimate Partner Violence: Not on file    FAMILY HISTORY: Family History  Problem Relation Age of Onset   COPD Father    Colon cancer Neg Hx    Esophageal cancer Neg Hx    Stomach cancer Neg Hx    Rectal cancer Neg Hx    Pancreatic cancer Neg Hx     ALLERGIES:  has No Known Allergies.  MEDICATIONS:  Current Outpatient Medications  Medication Sig Dispense Refill   carvedilol (COREG) 3.125 MG tablet Take 3.125 mg by mouth 2 (two) times daily.     [employer]S CALCIUM 600 MG tablet Take 600 mg by mouth 2 (two) times daily.     Ensure Plus (ENSURE PLUS) LIQD Take 237 mLs by mouth 2 (two) times daily between meals.     ENTRESTO 24-26 MG Take 1 tablet by mouth 2 (two) times daily.     furosemide (LASIX) 20 MG tablet Take 20 mg by mouth daily.     Multiple Vitamin (MULTIVITAMIN WITH MINERALS) TABS tablet Take 1 tablet by mouth daily. 90 tablet 1   pantoprazole (PROTONIX) 40 MG tablet Take 1 tablet (40 mg total) by mouth daily. 30 tablet 5   spironolactone (ALDACTONE) 25 MG tablet Take 12.5 mg by mouth daily.     Testosterone 12.5 MG/ACT (1%) GEL Place onto the skin. 2 pumps on each arm each morning     Thiamine HCl (VITAMIN B-1) 250 MG tablet Take 250 mg by mouth daily.     vitamin B-12 (CYANOCOBALAMIN) 1000 MCG tablet Take 1,000 mcg by mouth daily.     vitamin C (ASCORBIC ACID) 500 MG tablet Take 500 mg by mouth daily.     glipiZIDE (GLUCOTROL) 5 MG tablet Take 5 mg by mouth daily before breakfast.     Testosterone 12.5 MG/ACT (1%) GEL Apply 1 pump into each shoulder once daily.  Allow 5 minutes to dry (Patient not taking: Reported on 09/17/2021)     No current facility-administered medications for this visit.    REVIEW OF SYSTEMS:   Constitutional: (  - ) fevers, ( - )  chills , ( - ) night sweats Eyes: ( - ) blurriness of vision, ( - ) double vision, ( - ) watery eyes Ears, nose, mouth, throat, and face: ( - ) mucositis, ( - ) sore throat Respiratory: ( - ) cough, ( - ) dyspnea, ( - ) wheezes Cardiovascular: ( - ) palpitation, ( - ) chest discomfort, ( - ) lower extremity swelling Gastrointestinal:  ( - ) nausea, ( - ) heartburn, ( - ) change in bowel habits Skin: ( - ) abnormal skin rashes Lymphatics: ( - ) new lymphadenopathy, ( - ) easy bruising Neurological: ( - )  numbness, ( - ) tingling, ( - ) new weaknesses Behavioral/Psych: ( - ) mood change, ( - ) new changes  All other systems were reviewed with the patient and are negative.  PHYSICAL EXAMINATION: ECOG PERFORMANCE STATUS: 1 - Symptomatic but completely ambulatory  Vitals:   09/17/21 1450  BP: 126/74  Pulse: 83  Resp: 18  Temp: 97.7 F (36.5 C)  SpO2: 99%   Filed Weights   09/17/21 1450  Weight: 141 lb 1.6 oz (64 kg)    GENERAL: thin male in NAD  SKIN: skin color, texture, turgor are normal, no rashes or significant lesions EYES: conjunctiva are pink and non-injected, sclera clear LUNGS: clear to auscultation and percussion with normal breathing effort HEART: regular rate & rhythm and no murmurs and no lower extremity edema Musculoskeletal: no cyanosis of digits and no clubbing  PSYCH: alert & oriented x 3, fluent speech NEURO: no focal motor/sensory deficits  LABORATORY DATA:  I have reviewed the data as listed CBC Latest Ref Rng & Units 09/17/2021 03/20/2021 12/03/2020  WBC 4.0 - 10.5 K/uL 7.2 7.4 6.6  Hemoglobin 13.0 - 17.0 g/dL 13.7 12.0(L) 11.8(L)  Hematocrit 39.0 - 52.0 % 39.1 34.6(L) 33.5(L)  Platelets 150 - 400 K/uL 228 207 180    CMP Latest Ref Rng & Units 09/17/2021 12/03/2020 10/12/2020  Glucose 70 - 99 mg/dL 198(H) 125(H) 180(H)  BUN 8 - 23 mg/dL 24(H) 20 25(H)  Creatinine 0.61 - 1.24 mg/dL 0.88 0.80 0.69  Sodium 135 - 145 mmol/L 135 141 139   Potassium 3.5 - 5.1 mmol/L 4.3 3.2(L) 3.5  Chloride 98 - 111 mmol/L 101 104 103  CO2 22 - 32 mmol/L 28 28 29   Calcium 8.9 - 10.3 mg/dL 10.0 9.6 9.9  Total Protein 6.5 - 8.1 g/dL 7.3 7.1 -  Total Bilirubin 0.3 - 1.2 mg/dL 0.5 0.7 -  Alkaline Phos 38 - 126 U/L 72 62 -  AST 15 - 41 U/L 16 17 -  ALT 0 - 44 U/L 18 19 -   ASSESSMENT & PLAN Aaron Rush is a 76 y.o. male returns for a follow up for anemia.   #Anemia, resolved: --Etiology unknown. --Patient currently takes vitamin B12 1000 mcg once daily.  --Patient denies any signs of bleeding. Colonoscopy from 12/12/2020 showed no evidence of bleeding.  --Workup from 12/03/2020 ruled out iron deficiency, vitamin B12 deficiency, folate deficiency and paraproteinemia. --Labs today show that anemia has resolved with Hgb of 13.7. Rest of the labs were reviewed and require no intervention.  --Since anemia has resolved and patient is feeling better/gaining weight, we will monitor for now. Consider bone marrow biopsy if anemia worsens.  --RTC in 6 months with labs.   Orders Placed This Encounter  Procedures   CBC with Differential ([place] Only)    Standing Status:   Future    Standing Expiration Date:   09/18/2022   CMP ([place] only)    Standing Status:   Future    Standing Expiration Date:   09/18/2022     All questions were answered. The patient knows to call the clinic with any problems, questions or concerns.  I have spent a total of 25 minutes minutes of face-to-face and non-face-to-face time, preparing to see the patient, performing a medically appropriate examination, counseling and educating the patient, documenting clinical information in the electronic health record, independently interpreting results and care coordination.   Dede Query, PA-C Department of Hematology/Oncology [place] at [facility]  Phone: [phone]

## 2021-09-18 ENCOUNTER — Telehealth: Payer: Self-pay | Admitting: Hematology and Oncology

## 2021-09-18 NOTE — Telephone Encounter (Signed)
Scheduled per 3/7 los, pt has been called and confirmed  ?

## 2021-09-23 DIAGNOSIS — I429 Cardiomyopathy, unspecified: Secondary | ICD-10-CM | POA: Diagnosis not present

## 2021-10-10 DIAGNOSIS — I5032 Chronic diastolic (congestive) heart failure: Secondary | ICD-10-CM | POA: Diagnosis not present

## 2021-10-10 DIAGNOSIS — E871 Hypo-osmolality and hyponatremia: Secondary | ICD-10-CM | POA: Diagnosis not present

## 2021-10-10 DIAGNOSIS — K219 Gastro-esophageal reflux disease without esophagitis: Secondary | ICD-10-CM | POA: Diagnosis not present

## 2021-10-10 DIAGNOSIS — E119 Type 2 diabetes mellitus without complications: Secondary | ICD-10-CM | POA: Diagnosis not present

## 2021-10-10 DIAGNOSIS — I152 Hypertension secondary to endocrine disorders: Secondary | ICD-10-CM | POA: Diagnosis not present

## 2021-10-10 DIAGNOSIS — E1159 Type 2 diabetes mellitus with other circulatory complications: Secondary | ICD-10-CM | POA: Diagnosis not present

## 2021-10-10 DIAGNOSIS — E0844 Diabetes mellitus due to underlying condition with diabetic amyotrophy: Secondary | ICD-10-CM | POA: Diagnosis not present

## 2021-10-10 DIAGNOSIS — R7989 Other specified abnormal findings of blood chemistry: Secondary | ICD-10-CM | POA: Diagnosis not present

## 2021-11-05 DIAGNOSIS — I428 Other cardiomyopathies: Secondary | ICD-10-CM | POA: Diagnosis not present

## 2021-11-05 DIAGNOSIS — E1159 Type 2 diabetes mellitus with other circulatory complications: Secondary | ICD-10-CM | POA: Diagnosis not present

## 2021-11-05 DIAGNOSIS — I2510 Atherosclerotic heart disease of native coronary artery without angina pectoris: Secondary | ICD-10-CM | POA: Diagnosis not present

## 2021-11-05 DIAGNOSIS — I152 Hypertension secondary to endocrine disorders: Secondary | ICD-10-CM | POA: Diagnosis not present

## 2021-11-05 DIAGNOSIS — I5032 Chronic diastolic (congestive) heart failure: Secondary | ICD-10-CM | POA: Diagnosis not present

## 2021-11-07 DIAGNOSIS — I5189 Other ill-defined heart diseases: Secondary | ICD-10-CM | POA: Diagnosis not present

## 2021-11-07 DIAGNOSIS — Z8781 Personal history of (healed) traumatic fracture: Secondary | ICD-10-CM | POA: Diagnosis not present

## 2021-11-07 DIAGNOSIS — I5032 Chronic diastolic (congestive) heart failure: Secondary | ICD-10-CM | POA: Diagnosis not present

## 2021-11-07 DIAGNOSIS — E119 Type 2 diabetes mellitus without complications: Secondary | ICD-10-CM | POA: Diagnosis not present

## 2021-11-07 DIAGNOSIS — I152 Hypertension secondary to endocrine disorders: Secondary | ICD-10-CM | POA: Diagnosis not present

## 2021-11-07 DIAGNOSIS — I428 Other cardiomyopathies: Secondary | ICD-10-CM | POA: Diagnosis not present

## 2021-11-07 DIAGNOSIS — E1159 Type 2 diabetes mellitus with other circulatory complications: Secondary | ICD-10-CM | POA: Diagnosis not present

## 2021-11-07 DIAGNOSIS — I2510 Atherosclerotic heart disease of native coronary artery without angina pectoris: Secondary | ICD-10-CM | POA: Diagnosis not present

## 2021-11-07 DIAGNOSIS — I351 Nonrheumatic aortic (valve) insufficiency: Secondary | ICD-10-CM | POA: Diagnosis not present

## 2021-11-07 DIAGNOSIS — K449 Diaphragmatic hernia without obstruction or gangrene: Secondary | ICD-10-CM | POA: Diagnosis not present

## 2021-11-19 DIAGNOSIS — I152 Hypertension secondary to endocrine disorders: Secondary | ICD-10-CM | POA: Diagnosis not present

## 2021-11-19 DIAGNOSIS — I2510 Atherosclerotic heart disease of native coronary artery without angina pectoris: Secondary | ICD-10-CM | POA: Diagnosis not present

## 2021-11-19 DIAGNOSIS — E1159 Type 2 diabetes mellitus with other circulatory complications: Secondary | ICD-10-CM | POA: Diagnosis not present

## 2021-11-19 DIAGNOSIS — I428 Other cardiomyopathies: Secondary | ICD-10-CM | POA: Diagnosis not present

## 2021-11-19 DIAGNOSIS — I5032 Chronic diastolic (congestive) heart failure: Secondary | ICD-10-CM | POA: Diagnosis not present

## 2021-11-19 DIAGNOSIS — E119 Type 2 diabetes mellitus without complications: Secondary | ICD-10-CM | POA: Diagnosis not present

## 2021-11-20 DIAGNOSIS — R931 Abnormal findings on diagnostic imaging of heart and coronary circulation: Secondary | ICD-10-CM | POA: Diagnosis not present

## 2021-12-04 DIAGNOSIS — I152 Hypertension secondary to endocrine disorders: Secondary | ICD-10-CM | POA: Diagnosis not present

## 2021-12-04 DIAGNOSIS — E1159 Type 2 diabetes mellitus with other circulatory complications: Secondary | ICD-10-CM | POA: Diagnosis not present

## 2021-12-04 DIAGNOSIS — I5022 Chronic systolic (congestive) heart failure: Secondary | ICD-10-CM | POA: Diagnosis not present

## 2021-12-05 ENCOUNTER — Telehealth: Payer: Self-pay

## 2021-12-05 ENCOUNTER — Telehealth: Payer: Self-pay | Admitting: *Deleted

## 2021-12-05 NOTE — Telephone Encounter (Signed)
Received a call from Raquel Sarna, nurse at [facility] Cardiology regarding mutual patient. Raquel Sarna states that they are planning to schedule patient for a procedure in the Cath lab and he will likely receive a stent in one of his coronary arteries. Pt will need to be on anticoag for at least 1 year. They wanted to check with you due to his GI history to see if you think that it is OK to proceed. The date of the surgery is TBD. Please provide a note stating wether or not the patient is cleared for this procedure from a GI standpoint. Raquel Sarna requested that I fax your response to her at [phone].

## 2021-12-05 NOTE — Telephone Encounter (Signed)
I have reviewed his chart. He had an EGD and colonoscopy without any concerning / high risk pathology for bleeding. He has a chronic anemia of unknown etiology followed by hematology but not thought to be coming from his GI tract. He can proceed with cardiac cath and okay to start on anticoagulation from my perspective, assuming he has not developed any bleeding symptoms since I have last seen him. Can you please relay this to them? Thank you

## 2021-12-05 NOTE — Telephone Encounter (Signed)
Received call from [facility] seeking medical clearance for pt to have cardiac cath and possible stents. If pt had stents placed, he would need to be on anticoagulants.  His cardiologist needs medic al clearance to schedule this procedure.  Please advise.

## 2021-12-06 NOTE — Telephone Encounter (Signed)
Left message on machine to call back  

## 2021-12-10 NOTE — Telephone Encounter (Signed)
Medical clearance letter faxed to [facility] at [facility] Cardiology.

## 2021-12-11 NOTE — Telephone Encounter (Signed)
Letter for medical clearance faxed to [facility] Card[facility] attn [facility]  confirmation received

## 2021-12-12 ENCOUNTER — Telehealth: Payer: Self-pay | Admitting: Gastroenterology

## 2021-12-12 NOTE — Telephone Encounter (Signed)
Raquel Sarna called from [facility] cardiology regarding this patient no other info provided. [phone]

## 2021-12-12 NOTE — Telephone Encounter (Signed)
Returned call to [place]. Lm on her vm asking that she give me a call back. I provided her with my direct office phone #.

## 2021-12-12 NOTE — Telephone Encounter (Signed)
Aaron Rush returned call. She stated that she was calling to follow up on clearance that was previously requested. I informed her that I faxed a letter with Dr. Doyne Keel response. I reviewed Dr. Doyne Keel letter and I told Aaron Rush that I will re-fax it to her today. Aaron Rush verbalized understanding of all information and had no concerns at the end of the call.

## 2021-12-13 DIAGNOSIS — I5022 Chronic systolic (congestive) heart failure: Secondary | ICD-10-CM | POA: Diagnosis not present

## 2021-12-13 DIAGNOSIS — I5032 Chronic diastolic (congestive) heart failure: Secondary | ICD-10-CM | POA: Diagnosis not present

## 2021-12-13 DIAGNOSIS — I152 Hypertension secondary to endocrine disorders: Secondary | ICD-10-CM | POA: Diagnosis not present

## 2021-12-13 DIAGNOSIS — E1159 Type 2 diabetes mellitus with other circulatory complications: Secondary | ICD-10-CM | POA: Diagnosis not present

## 2021-12-18 DIAGNOSIS — I255 Ischemic cardiomyopathy: Secondary | ICD-10-CM | POA: Diagnosis not present

## 2021-12-18 DIAGNOSIS — I2510 Atherosclerotic heart disease of native coronary artery without angina pectoris: Secondary | ICD-10-CM | POA: Diagnosis not present

## 2021-12-18 DIAGNOSIS — I2584 Coronary atherosclerosis due to calcified coronary lesion: Secondary | ICD-10-CM | POA: Diagnosis not present

## 2021-12-18 DIAGNOSIS — I110 Hypertensive heart disease with heart failure: Secondary | ICD-10-CM | POA: Diagnosis not present

## 2021-12-18 DIAGNOSIS — K861 Other chronic pancreatitis: Secondary | ICD-10-CM | POA: Diagnosis not present

## 2021-12-18 DIAGNOSIS — I25119 Atherosclerotic heart disease of native coronary artery with unspecified angina pectoris: Secondary | ICD-10-CM | POA: Diagnosis not present

## 2021-12-18 DIAGNOSIS — I5022 Chronic systolic (congestive) heart failure: Secondary | ICD-10-CM | POA: Diagnosis not present

## 2021-12-18 DIAGNOSIS — I10 Essential (primary) hypertension: Secondary | ICD-10-CM | POA: Diagnosis not present

## 2021-12-18 DIAGNOSIS — Z79899 Other long term (current) drug therapy: Secondary | ICD-10-CM | POA: Diagnosis not present

## 2021-12-18 DIAGNOSIS — D649 Anemia, unspecified: Secondary | ICD-10-CM | POA: Diagnosis not present

## 2021-12-18 DIAGNOSIS — E119 Type 2 diabetes mellitus without complications: Secondary | ICD-10-CM | POA: Diagnosis not present

## 2021-12-18 DIAGNOSIS — Z7982 Long term (current) use of aspirin: Secondary | ICD-10-CM | POA: Diagnosis not present

## 2021-12-18 DIAGNOSIS — E1159 Type 2 diabetes mellitus with other circulatory complications: Secondary | ICD-10-CM | POA: Diagnosis not present

## 2021-12-18 DIAGNOSIS — Z7984 Long term (current) use of oral hypoglycemic drugs: Secondary | ICD-10-CM | POA: Diagnosis not present

## 2021-12-18 DIAGNOSIS — Z8673 Personal history of transient ischemic attack (TIA), and cerebral infarction without residual deficits: Secondary | ICD-10-CM | POA: Diagnosis not present

## 2021-12-18 DIAGNOSIS — I429 Cardiomyopathy, unspecified: Secondary | ICD-10-CM | POA: Diagnosis not present

## 2021-12-18 DIAGNOSIS — E785 Hyperlipidemia, unspecified: Secondary | ICD-10-CM | POA: Diagnosis not present

## 2022-01-06 DIAGNOSIS — I255 Ischemic cardiomyopathy: Secondary | ICD-10-CM | POA: Diagnosis not present

## 2022-01-06 DIAGNOSIS — I5022 Chronic systolic (congestive) heart failure: Secondary | ICD-10-CM | POA: Diagnosis not present

## 2022-01-06 DIAGNOSIS — I25119 Atherosclerotic heart disease of native coronary artery with unspecified angina pectoris: Secondary | ICD-10-CM | POA: Diagnosis not present

## 2022-01-06 DIAGNOSIS — E1159 Type 2 diabetes mellitus with other circulatory complications: Secondary | ICD-10-CM | POA: Diagnosis not present

## 2022-01-06 DIAGNOSIS — I152 Hypertension secondary to endocrine disorders: Secondary | ICD-10-CM | POA: Diagnosis not present

## 2022-01-09 DIAGNOSIS — K219 Gastro-esophageal reflux disease without esophagitis: Secondary | ICD-10-CM | POA: Diagnosis not present

## 2022-01-09 DIAGNOSIS — I255 Ischemic cardiomyopathy: Secondary | ICD-10-CM | POA: Diagnosis not present

## 2022-01-09 DIAGNOSIS — I25119 Atherosclerotic heart disease of native coronary artery with unspecified angina pectoris: Secondary | ICD-10-CM | POA: Diagnosis not present

## 2022-01-09 DIAGNOSIS — E039 Hypothyroidism, unspecified: Secondary | ICD-10-CM | POA: Diagnosis not present

## 2022-01-09 DIAGNOSIS — I5022 Chronic systolic (congestive) heart failure: Secondary | ICD-10-CM | POA: Diagnosis not present

## 2022-01-09 DIAGNOSIS — E785 Hyperlipidemia, unspecified: Secondary | ICD-10-CM | POA: Diagnosis not present

## 2022-01-09 DIAGNOSIS — I152 Hypertension secondary to endocrine disorders: Secondary | ICD-10-CM | POA: Diagnosis not present

## 2022-01-09 DIAGNOSIS — E1144 Type 2 diabetes mellitus with diabetic amyotrophy: Secondary | ICD-10-CM | POA: Diagnosis not present

## 2022-01-09 DIAGNOSIS — E119 Type 2 diabetes mellitus without complications: Secondary | ICD-10-CM | POA: Diagnosis not present

## 2022-01-09 DIAGNOSIS — R7989 Other specified abnormal findings of blood chemistry: Secondary | ICD-10-CM | POA: Diagnosis not present

## 2022-01-09 DIAGNOSIS — E871 Hypo-osmolality and hyponatremia: Secondary | ICD-10-CM | POA: Diagnosis not present

## 2022-01-09 DIAGNOSIS — E1159 Type 2 diabetes mellitus with other circulatory complications: Secondary | ICD-10-CM | POA: Diagnosis not present

## 2022-01-09 DIAGNOSIS — E1169 Type 2 diabetes mellitus with other specified complication: Secondary | ICD-10-CM | POA: Diagnosis not present

## 2022-01-21 DIAGNOSIS — I2510 Atherosclerotic heart disease of native coronary artery without angina pectoris: Secondary | ICD-10-CM | POA: Diagnosis not present

## 2022-01-21 DIAGNOSIS — E1159 Type 2 diabetes mellitus with other circulatory complications: Secondary | ICD-10-CM | POA: Diagnosis not present

## 2022-01-21 DIAGNOSIS — I152 Hypertension secondary to endocrine disorders: Secondary | ICD-10-CM | POA: Diagnosis not present

## 2022-02-05 DIAGNOSIS — H52223 Regular astigmatism, bilateral: Secondary | ICD-10-CM | POA: Diagnosis not present

## 2022-03-04 DIAGNOSIS — I152 Hypertension secondary to endocrine disorders: Secondary | ICD-10-CM | POA: Diagnosis not present

## 2022-03-04 DIAGNOSIS — E1159 Type 2 diabetes mellitus with other circulatory complications: Secondary | ICD-10-CM | POA: Diagnosis not present

## 2022-03-04 DIAGNOSIS — I2510 Atherosclerotic heart disease of native coronary artery without angina pectoris: Secondary | ICD-10-CM | POA: Diagnosis not present

## 2022-03-20 ENCOUNTER — Inpatient Hospital Stay: Payer: Medicare HMO | Attending: Hematology and Oncology

## 2022-03-20 ENCOUNTER — Inpatient Hospital Stay (HOSPITAL_BASED_OUTPATIENT_CLINIC_OR_DEPARTMENT_OTHER): Payer: Medicare HMO | Admitting: Hematology and Oncology

## 2022-03-20 ENCOUNTER — Other Ambulatory Visit: Payer: Self-pay

## 2022-03-20 VITALS — BP 139/80 | HR 69 | Temp 98.2°F | Resp 18 | Wt 144.0 lb

## 2022-03-20 DIAGNOSIS — K5730 Diverticulosis of large intestine without perforation or abscess without bleeding: Secondary | ICD-10-CM | POA: Diagnosis not present

## 2022-03-20 DIAGNOSIS — E119 Type 2 diabetes mellitus without complications: Secondary | ICD-10-CM | POA: Diagnosis not present

## 2022-03-20 DIAGNOSIS — D539 Nutritional anemia, unspecified: Secondary | ICD-10-CM | POA: Diagnosis not present

## 2022-03-20 DIAGNOSIS — D649 Anemia, unspecified: Secondary | ICD-10-CM | POA: Diagnosis not present

## 2022-03-20 DIAGNOSIS — I509 Heart failure, unspecified: Secondary | ICD-10-CM | POA: Insufficient documentation

## 2022-03-20 DIAGNOSIS — K648 Other hemorrhoids: Secondary | ICD-10-CM | POA: Insufficient documentation

## 2022-03-20 LAB — CBC WITH DIFFERENTIAL (CANCER CENTER ONLY)
Abs Immature Granulocytes: 0.01 10*3/uL (ref 0.00–0.07)
Basophils Absolute: 0 10*3/uL (ref 0.0–0.1)
Basophils Relative: 0 %
Eosinophils Absolute: 0.3 10*3/uL (ref 0.0–0.5)
Eosinophils Relative: 4 %
HCT: 37.3 % — ABNORMAL LOW (ref 39.0–52.0)
Hemoglobin: 13.3 g/dL (ref 13.0–17.0)
Immature Granulocytes: 0 %
Lymphocytes Relative: 23 %
Lymphs Abs: 2 10*3/uL (ref 0.7–4.0)
MCH: 33.5 pg (ref 26.0–34.0)
MCHC: 35.7 g/dL (ref 30.0–36.0)
MCV: 94 fL (ref 80.0–100.0)
Monocytes Absolute: 0.6 10*3/uL (ref 0.1–1.0)
Monocytes Relative: 6 %
Neutro Abs: 5.8 10*3/uL (ref 1.7–7.7)
Neutrophils Relative %: 67 %
Platelet Count: 196 10*3/uL (ref 150–400)
RBC: 3.97 MIL/uL — ABNORMAL LOW (ref 4.22–5.81)
RDW: 13.3 % (ref 11.5–15.5)
WBC Count: 8.7 10*3/uL (ref 4.0–10.5)
nRBC: 0 % (ref 0.0–0.2)

## 2022-03-20 LAB — CMP (CANCER CENTER ONLY)
ALT: 21 U/L (ref 0–44)
AST: 18 U/L (ref 15–41)
Albumin: 4.3 g/dL (ref 3.5–5.0)
Alkaline Phosphatase: 56 U/L (ref 38–126)
Anion gap: 6 (ref 5–15)
BUN: 20 mg/dL (ref 8–23)
CO2: 27 mmol/L (ref 22–32)
Calcium: 9.6 mg/dL (ref 8.9–10.3)
Chloride: 102 mmol/L (ref 98–111)
Creatinine: 0.82 mg/dL (ref 0.61–1.24)
GFR, Estimated: >60 mL/min (ref >60)
Glucose, Bld: 189 mg/dL — ABNORMAL HIGH (ref 70–99)
Potassium: 3.9 mmol/L (ref 3.5–5.1)
Sodium: 135 mmol/L (ref 135–145)
Total Bilirubin: 0.5 mg/dL (ref 0.3–1.2)
Total Protein: 7.2 g/dL (ref 6.5–8.1)

## 2022-03-20 NOTE — Progress Notes (Signed)
[place] Telephone:(336) [phone]   Fax:(336) [phone]  PROGRESS NOTE  Patient Care Team: Bernerd Limbo, MD as PCP - General (Family Medicine) Alda Berthold, DO as Consulting Physician (Neurology)  Hematological/Oncological History 1) Labs from PCP, Bridget Hartshorn NP -07/03/2020: WBC 6.1, Hgb 12.6, MCV 101.4, Plt 343 -09/11/2020: WBC 7.1, Hgb 12.6, MCV 101.9, Plt 286 -11/06/2020:WBC 5.7, Hgb 11.8 , MCV 98.0, Plt 232  2) 11/01/2020: EUS/EGD: No gross lesions. Salmon-colored mucosa present from 36-37 cm, Large 6 cm hiatal hernia present, gastritis in antrum/body. Pancreatic parenchymal abnormalities consisting of atrophy, lobularity with honeycombing, hyperechoic foci without shadowing and hyperechoic strands were noted in the entire pancreas.  3) 12/03/2020: Establish care with Dede Query PA-C  4) 12/12/2020: Colonoscopy: Two 4 to 7 mm polyps in the sigmoid colon, removed with a cold snare. Resected and retrieved.Diverticulosis in the entire examined colon.Internal hemorrhoids.The examination was otherwise normal.  CHIEF COMPLAINT: Anemia  HISTORY OF PRESENTING ILLNESS:  Aaron Rush 76 y.o. male returns for a follow up for anemia.He was last seen in clinic on 09/17/2021 and in the interim, he denies any changes to his health.   On exam today, Aaron Rush reports he has been improving greatly in the interim since her last visit.  He reports that he is walking safely without a cane we does keep it handy.  He notes that he is breathing well and is not having any issues with shortness of breath, fatigue, or lightheadedness or dizziness.  He also reports he is not having trouble with bleeding, bruising, or dark stools.  He continues to take vitamin B12 supplementation as well as a multivitamin.  His appetite has been good and his weight has been steadily improving.  He reports he does still have some atrophy of his legs and is trying to improve his strength.  Patient  denies fevers, chills, night sweats, shortness of breath, chest pain or cough.  He has no other complaints.  Rest of the 10 point ROS is below.  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   Dx at [place] visit in 05-2020    SURGICAL HISTORY: Past Surgical History:  Procedure Laterality Date   BIOPSY  11/01/2020   Procedure: BIOPSY;  Surgeon: Irving Copas., MD;  Location: [facility] ENDOSCOPY;  Service: Endoscopy;;   ESOPHAGOGASTRODUODENOSCOPY     ESOPHAGOGASTRODUODENOSCOPY (EGD) WITH PROPOFOL N/A 11/01/2020   Procedure: ESOPHAGOGASTRODUODENOSCOPY (EGD) WITH PROPOFOL;  Surgeon: Irving Copas., MD;  Location[facility] ENDOSCOPY;  Service: Endoscopy;  Laterality: N/A;   EUS N/A 11/01/2020   Procedure: UPPER ENDOSCOPIC ULTRASOUND (EUS) RADIAL;  Surgeon: Rush Landmark Telford Nab., MD;  Location: [place];  Service: Endoscopy;  Laterality: N/A;   HERNIA REPAIR     umbilical in the 33'H    SOCIAL HISTORY: Social History   Socioeconomic History   Marital status: Divorced    Spouse name: Not on file   Number of children: 0   Years of education: Not on file   Highest education level: Not on file  Occupational History   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  Tobacco Use   Smoking status: Never   Smokeless tobacco: Never  Vaping Use   Vaping Use: Never used  Substance and Sexual Activity   Alcohol use: Never    Comment: stopped 12/14/1980   Drug use: Never   Sexual activity: Not on file  Other Topics Concern   Not on file  Social History Narrative   Right Handed   One Story Home    Drinks caffeine   Social Determinants of Health    Financial Resource Strain: Not on file  Food Insecurity: No Food Insecurity (12/31/2020)   Hunger Vital Sign    Worried About Running Out of Food in the Last Year: Never true    Ran Out of Food in the Last Year: Never true  Transportation Needs: No Transportation Needs (12/31/2020)   PRAPARE - Hydrologist (Medical): No    Rush of Transportation (Non-Medical): No  Physical Activity: Not on file  Stress: Not on file  Social Connections: Not on file  Intimate Partner Violence: Not on file    FAMILY HISTORY: Family History  Problem Relation Age of Onset   COPD Father    Colon cancer Neg Hx    Esophageal cancer Neg Hx    Stomach cancer Neg Hx    Rectal cancer Neg Hx    Pancreatic cancer Neg Hx     ALLERGIES:  has No Known Allergies.  MEDICATIONS:  Current Outpatient Medications  Medication Sig Dispense Refill   carvedilol (COREG) 3.125 MG tablet Take 3.125 mg by mouth 2 (two) times daily.     CVS CALCIUM 600 MG tablet Take 600 mg by mouth 2 (two) times daily.     Ensure Plus (ENSURE PLUS) LIQD Take 237 mLs by mouth 2 (two) times daily between meals.     ENTRESTO 24-26 MG Take 1 tablet by mouth 2 (two) times daily.     furosemide (LASIX) 20 MG tablet Take 20 mg by mouth daily.     glipiZIDE (GLUCOTROL) 5 MG tablet Take 5 mg by mouth daily before breakfast.     Multiple Vitamin (MULTIVITAMIN WITH MINERALS) TABS tablet Take 1 tablet by mouth daily. 90 tablet 1   pantoprazole (PROTONIX) 40 MG tablet Take 1 tablet (40 mg total) by mouth daily. 30 tablet 5   spironolactone (ALDACTONE) 25 MG tablet Take 12.5 mg by mouth daily.     Testosterone 12.5 MG/ACT (1%) GEL Place onto the skin. 2 pumps on each arm each morning     Thiamine HCl (VITAMIN B-1) 250 MG tablet Take 250 mg by mouth daily.     vitamin B-12 (CYANOCOBALAMIN) 1000 MCG tablet Take 1,000 mcg by mouth daily.     vitamin C (ASCORBIC ACID) 500 MG tablet Take 500 mg by mouth daily.     No current  facility-administered medications for this visit.    REVIEW OF SYSTEMS:   Constitutional: ( - ) fevers, ( - )  chills , ( - ) night sweats Eyes: ( - ) blurriness of vision, ( - ) double vision, ( - ) watery eyes Ears, nose, mouth, throat, and face: ( - ) mucositis, ( - ) sore throat Respiratory: ( - ) cough, ( - ) dyspnea, ( - ) wheezes Cardiovascular: ( - ) palpitation, ( - ) chest discomfort, ( - ) lower extremity swelling Gastrointestinal:  ( - ) nausea, ( - ) heartburn, ( - ) change in bowel habits Skin: ( - )  abnormal skin rashes Lymphatics: ( - ) new lymphadenopathy, ( - ) easy bruising Neurological: ( - ) numbness, ( - ) tingling, ( - ) new weaknesses Behavioral/Psych: ( - ) mood change, ( - ) new changes  All other systems were reviewed with the patient and are negative.  PHYSICAL EXAMINATION: ECOG PERFORMANCE STATUS: 1 - Symptomatic but completely ambulatory  Vitals:   03/20/22 1458  BP: 139/80  Pulse: 69  Resp: 18  Temp: 98.2 F (36.8 C)  SpO2: 98%    Filed Weights   03/20/22 1458  Weight: 144 lb (65.3 kg)     GENERAL: thin male in NAD  SKIN: skin color, texture, turgor are normal, no rashes or significant lesions EYES: conjunctiva are pink and non-injected, sclera clear LUNGS: clear to auscultation and percussion with normal breathing effort HEART: regular rate & rhythm and no murmurs and no lower extremity edema Musculoskeletal: no cyanosis of digits and no clubbing  PSYCH: alert & oriented x 3, fluent speech NEURO: no focal motor/sensory deficits  LABORATORY DATA:  I have reviewed the data as listed    Latest Ref Rng & Units 03/20/2022    2:31 PM 09/17/2021    2:45 PM 03/20/2021    2:30 PM  CBC  WBC 4.0 - 10.5 K/uL 8.7  7.2  7.4   Hemoglobin 13.0 - 17.0 g/dL 13.3  13.7  12.0   Hematocrit 39.0 - 52.0 % 37.3  39.1  34.6   Platelets 150 - 400 K/uL 196  228  207        Latest Ref Rng & Units 03/20/2022    2:31 PM 09/17/2021    2:45 PM 12/03/2020    2:50 PM   CMP  Glucose 70 - 99 mg/dL 189  198  125   BUN 8 - 23 mg/dL _0 Creatinine 0.61 - 1.24 mg/dL 0.82  0.88  0.80   Sodium 135 - 145 mmol/L 135  135  141   Potassium 3.5 - 5.1 mmol/L 3.9  4.3  3.2   Chloride 98 - 111 mmol/L 102  101  104   CO2 22 - 32 mmol/L _1 Calcium 8.9 - 10.3 mg/dL 9.6  10.0  9.6   Total Protein 6.5 - 8.1 g/dL 7.2  7.3  7.1   Total Bilirubin 0.3 - 1.2 mg/dL 0.5  0.5  0.7   Alkaline Phos 38 - 126 U/L 56  72  62   AST 15 - 41 U/L _2 ALT 0 - 44 U/L _3 ASSESSMENT & PLAN Aaron Rush is a 76 y.o. male returns for a follow up for anemia.   #Anemia, resolved: --Etiology unknown. --Patient currently takes vitamin B12 1000 mcg once daily.  --Patient denies any signs of bleeding. Colonoscopy from 12/12/2020 showed no evidence of bleeding.  --Workup from 12/03/2020 ruled out iron deficiency, vitamin B12 deficiency, folate deficiency and paraproteinemia. --Labs today show that anemia has resolved with Hgb of 13.7. Rest of the labs were reviewed and require no intervention.  --Since anemia has resolved and patient is feeling better/gaining weight, we will monitor for now. Consider bone marrow biopsy if anemia worsens.  --RTC PRN.  Of note the patient is having an open heart surgery in October 2023.  His requested blood counts shortly after the surgery in order to assure that he does not become anemic postoperatively.  We will order the studies and if normal there is no need for routine follow-up.  If he becomes anemic we will need to evaluate further.  No orders of the defined types were placed in this encounter.    All questions were answered. The patient knows to call the clinic with any problems, questions or concerns.  I have spent a total of 25 minutes minutes of face-to-face and non-face-to-face time, preparing to see the patient, performing a medically appropriate examination, counseling and educating the patient, documenting  clinical information in the electronic health record, independently interpreting results and care coordination.   Ledell Peoples, MD Department of Hematology/Oncology [place] at [facility] Phone: [phone] Pager: [phone] Email: [email or web address]

## 2022-04-02 ENCOUNTER — Emergency Department (HOSPITAL_COMMUNITY)
Admission: EM | Admit: 2022-04-02 | Discharge: 2022-04-02 | Disposition: A | Discharge status: Home / Self Care | Payer: Medicare HMO | Attending: Emergency Medicine | Admitting: Emergency Medicine

## 2022-04-02 ENCOUNTER — Other Ambulatory Visit: Payer: Self-pay

## 2022-04-02 ENCOUNTER — Emergency Department (HOSPITAL_COMMUNITY): Payer: Medicare HMO

## 2022-04-02 ENCOUNTER — Encounter (HOSPITAL_COMMUNITY): Payer: Self-pay

## 2022-04-02 DIAGNOSIS — S02609A Fracture of mandible, unspecified, initial encounter for closed fracture: Secondary | ICD-10-CM | POA: Diagnosis not present

## 2022-04-02 DIAGNOSIS — K029 Dental caries, unspecified: Secondary | ICD-10-CM | POA: Diagnosis not present

## 2022-04-02 DIAGNOSIS — S0992XA Unspecified injury of nose, initial encounter: Secondary | ICD-10-CM | POA: Diagnosis not present

## 2022-04-02 DIAGNOSIS — R609 Edema, unspecified: Secondary | ICD-10-CM | POA: Diagnosis not present

## 2022-04-02 DIAGNOSIS — S0181XA Laceration without foreign body of other part of head, initial encounter: Secondary | ICD-10-CM | POA: Diagnosis not present

## 2022-04-02 DIAGNOSIS — Y9301 Activity, walking, marching and hiking: Secondary | ICD-10-CM | POA: Insufficient documentation

## 2022-04-02 DIAGNOSIS — S01111A Laceration without foreign body of right eyelid and periocular area, initial encounter: Secondary | ICD-10-CM | POA: Diagnosis not present

## 2022-04-02 DIAGNOSIS — R739 Hyperglycemia, unspecified: Secondary | ICD-10-CM | POA: Diagnosis not present

## 2022-04-02 DIAGNOSIS — R58 Hemorrhage, not elsewhere classified: Secondary | ICD-10-CM | POA: Diagnosis not present

## 2022-04-02 DIAGNOSIS — I10 Essential (primary) hypertension: Secondary | ICD-10-CM | POA: Diagnosis not present

## 2022-04-02 DIAGNOSIS — Z743 Need for continuous supervision: Secondary | ICD-10-CM | POA: Diagnosis not present

## 2022-04-02 DIAGNOSIS — W01198A Fall on same level from slipping, tripping and stumbling with subsequent striking against other object, initial encounter: Secondary | ICD-10-CM | POA: Diagnosis not present

## 2022-04-02 DIAGNOSIS — S022XXA Fracture of nasal bones, initial encounter for closed fracture: Secondary | ICD-10-CM | POA: Insufficient documentation

## 2022-04-02 DIAGNOSIS — S0990XA Unspecified injury of head, initial encounter: Secondary | ICD-10-CM

## 2022-04-02 DIAGNOSIS — W19XXXA Unspecified fall, initial encounter: Secondary | ICD-10-CM

## 2022-04-02 DIAGNOSIS — R9431 Abnormal electrocardiogram [ECG] [EKG]: Secondary | ICD-10-CM | POA: Diagnosis not present

## 2022-04-02 MED ORDER — LIDOCAINE-EPINEPHRINE (PF) 2 %-1:200000 IJ SOLN
10.0000 mL | Freq: Once | INTRAMUSCULAR | Status: AC
  Administered 2022-04-02: 10 mL
  Filled 2022-04-02: qty 20

## 2022-04-02 NOTE — ED Notes (Addendum)
MD at bedside. 

## 2022-04-02 NOTE — ED Provider Notes (Signed)
[facility] EMERGENCY DEPARTMENT Provider Note   CSN: [id number] Arrival date & time: 04/02/22  1818     History  Chief Complaint  Patient presents with   Trauma    Aaron Rush is a 76 y.o. male.  76 year old male with prior medical history as detailed below presents for evaluation.  Patient reports that he got tripped up and then fell.  He did strike his nose and forehead.  He denies loss conscious.  Patient reports that he takes Plavix daily.  He has lacerations to his right eyebrow and a small laceration to the bridge of his nose.  No active bleeding noted.  He denies other injury.  He denies neck pain.  He was able to ambulate without difficulty after the fall.  Tetanus is up-to-date.  He denies any acute dental trauma or dental pain.  The history is provided by the patient and medical records.       Home Medications Prior to Admission medications   Medication Sig Start Date End Date Taking? Authorizing Provider  carvedilol (COREG) 3.125 MG tablet Take 3.125 mg by mouth 2 (two) times daily. 04/05/20   [provider]  [employer] CALCIUM 600 MG tablet Take 600 mg by mouth 2 (two) times daily. 04/05/20   [provider]  Ensure Plus (ENSURE PLUS) LIQD Take 237 mLs by mouth 2 (two) times daily between meals.    [provider]  ENTRESTO 24-26 MG Take 1 tablet by mouth 2 (two) times daily. 05/14/20   [provider]  furosemide (LASIX) 20 MG tablet Take 20 mg by mouth daily. 07/02/20   [provider]  glipiZIDE (GLUCOTROL) 5 MG tablet Take 5 mg by mouth daily before breakfast. 07/04/20 05/23/21  [provider]  Multiple Vitamin (MULTIVITAMIN WITH MINERALS) TABS tablet Take 1 tablet by mouth daily. 06/25/20   Mercy Riding, MD  pantoprazole (PROTONIX) 40 MG tablet Take 1 tablet (40 mg total) by mouth daily. 11/01/20   Mansouraty, Telford Nab., MD  spironolactone (ALDACTONE) 25 MG tablet Take 12.5 mg by mouth  daily. 07/02/20   [provider]  Testosterone 12.5 MG/ACT (1%) GEL Place onto the skin. 2 pumps on each arm each morning 08/20/21 08/20/22  [provider]  Thiamine HCl (VITAMIN B-1) 250 MG tablet Take 250 mg by mouth daily.    [provider]  vitamin B-12 (CYANOCOBALAMIN) 1000 MCG tablet Take 1,000 mcg by mouth daily.    [provider]  vitamin C (ASCORBIC ACID) 500 MG tablet Take 500 mg by mouth daily.    [provider]      Allergies    Patient has no known allergies.    Review of Systems   Review of Systems  All other systems reviewed and are negative.   Physical Exam Updated Vital Signs BP (!) 174/68   Temp 97.9 F (36.6 C) (Oral)   Resp 18   Ht '5\' 11"'$  (1.803 m)   Wt 65.3 kg   SpO2 100%   BMI 20.08 kg/m  Physical Exam Vitals and nursing note reviewed.  Constitutional:      General: He is not in acute distress.    Appearance: Normal appearance. He is well-developed.  HENT:     Head: Normocephalic.     Comments: 1 cm laceration to the right eyebrow  0.25 cm laceration to the nasal bridge Eyes:     Conjunctiva/sclera: Conjunctivae normal.     Pupils: Pupils are equal, round, and  reactive to light.  Cardiovascular:     Rate and Rhythm: Normal rate and regular rhythm.     Heart sounds: Normal heart sounds.  Pulmonary:     Effort: Pulmonary effort is normal. No respiratory distress.     Breath sounds: Normal breath sounds.  Abdominal:     General: There is no distension.     Palpations: Abdomen is soft.     Tenderness: There is no abdominal tenderness.  Musculoskeletal:        General: No deformity. Normal range of motion.     Cervical back: Normal range of motion and neck supple.  Skin:    General: Skin is warm and dry.  Neurological:     General: No focal deficit present.     Mental Status: He is alert and oriented to person, place, and time.     ED Results / Procedures / Treatments   Labs (all labs ordered  are listed, but only abnormal results are displayed) Labs Reviewed - No data to display  EKG EKG Interpretation  Date/Time:  Wednesday April 02 2022 18:18:50 EDT Ventricular Rate:  89 PR Interval:    QRS Duration: 105 QT Interval:  356 QTC Calculation: 434 R Axis:   11 Text Interpretation: Poor basline Anterior infarct, old Confirmed by Dene Gentry [phone]) on 04/02/2022 6:21:46 PM  Radiology No results found.  Procedures .Marland KitchenLaceration Repair  Date/Time: 04/02/2022 7:23 PM  Performed by: Valarie Merino, MD Authorized by: Valarie Merino, MD   Consent:    Consent obtained:  Verbal   Consent given by:  Patient   Risks, benefits, and alternatives were discussed: yes     Risks discussed:  Infection, pain, poor cosmetic result, retained foreign body, tendon damage, vascular damage, poor wound healing, nerve damage and need for additional repair   Alternatives discussed:  No treatment Universal protocol:    Immediately prior to procedure, a time out was called: yes     Patient identity confirmed:  Verbally with patient Anesthesia:    Anesthesia method:  Local infiltration   Local anesthetic:  Lidocaine 1% WITH epi Laceration details:    Location:  Face   Face location:  R eyebrow   Length (cm):  1 Pre-procedure details:    Preparation:  Patient was prepped and draped in usual sterile fashion and imaging obtained to evaluate for foreign bodies Exploration:    Hemostasis achieved with:  Direct pressure   Imaging outcome: foreign body not noted     Wound exploration: wound explored through full range of motion     Contaminated: no   Treatment:    Area cleansed with:  Povidone-iodine and saline   Amount of cleaning:  Standard   Irrigation solution:  Sterile saline   Visualized foreign bodies/material removed: no     Debridement:  None   Undermining:  None Skin repair:    Repair method:  Sutures   Suture size:  5-0   Suture material:  Prolene   Suture technique:   Simple interrupted   Number of sutures:  4 Approximation:    Approximation:  Close Repair type:    Repair type:  Simple Post-procedure details:    Dressing:  Open (no dressing)   Procedure completion:  Tolerated     Medications Ordered in ED Medications  lidocaine-EPINEPHrine (XYLOCAINE W/EPI) 2 %-1:200000 (PF) injection 10 mL (has no administration in time range)    ED Course/ Medical Decision Making/ A&P  Medical Decision Making Amount and/or Complexity of Data Reviewed Radiology: ordered.  Risk Prescription drug management.    Medical Screen Complete  This patient presented to the ED with complaint of fall, head injury.  This complaint involves an extensive number of treatment options. The initial differential diagnosis includes, but is not limited to, trauma related to fall  This presentation is: Acute, Self-Limited, Previously Undiagnosed, Uncertain Prognosis, Complicated, Systemic Symptoms, and Threat to Life/Bodily Function  Patient presents after mechanical fall.  He did strike his head.    He suffered minor lacerations to the right eyebrow and nasal bridge.  Tetanus is up-to-date  Patient denies any acute dental trauma or pain  CT imaging is without acute intracranial pathology.  Nasal fracture is noted.  Patient is without acute dental pain.  Patient reports that his dentition has had significant decay and wear that is chronic.  Laceration repaired without difficulty.  Patient tolerated this well.  Importance of close follow-up stressed.  Strict return precautions given and understood.  Additional history obtained: External records from outside sources obtained and reviewed including prior ED visits and prior Inpatient records.    Imaging Studies ordered:  I ordered imaging studies including CT head, CT maxillofacial I independently visualized and interpreted obtained imaging which showed nasal fracture I agree with the  radiologist interpretation.  Problem List / ED Course:  Fall, head injury, nasal fracture, facial laceration   Reevaluation:  After the interventions noted above, I reevaluated the patient and found that they have: improved   Disposition:  After consideration of the diagnostic results and the patients response to treatment, I feel that the patent would benefit from close outpatient followup.          Final Clinical Impression(s) / ED Diagnoses Final diagnoses:  Fall, initial encounter  Head injury  Facial laceration, initial encounter  Closed fracture of nasal bone, initial encounter    Rx / DC Orders ED Discharge Orders     None         Valarie Merino, MD 04/02/22 1928

## 2022-04-02 NOTE — ED Notes (Signed)
Patient verbalizes understanding of discharge instructions. Opportunity for questioning and answers were provided. Armband removed by staff, pt discharged from ED ambulatory w/ cane.

## 2022-04-02 NOTE — Discharge Instructions (Addendum)
For any problem.  Sutures placed today will need to removed in 7 to 10 days.

## 2022-04-02 NOTE — ED Notes (Signed)
Patient transported to CT 

## 2022-04-02 NOTE — ED Notes (Signed)
Trauma Response Nurse Documentation   Aaron Rush is a 76 y.o. male arriving to [facility] ED via [facility]  On clopidogrel 75 mg daily. Trauma was activated as a Level 2 by Charge RN based on the following trauma criteria Aaron Rush > 65 with head trauma on anti-coagulation (excluding ASA). Trauma team at the bedside on patient arrival.   Patient cleared for CT by Dr. Francia Greaves. Pt transported to CT with trauma response nurse present to monitor. RN remained with the patient throughout their absence from the department for clinical observation.   GCS 15.  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   Dx at hosp visit in 05-2020     Past Surgical History:  Procedure Laterality Date   BIOPSY  11/01/2020   Procedure: BIOPSY;  Surgeon: Irving Copas., MD;  Location: [name] ENDOSCOPY;  Service: Endoscopy;;   ESOPHAGOGASTRODUODENOSCOPY     ESOPHAGOGASTRODUODENOSCOPY (EGD) WITH PROPOFOL N/A 11/01/2020   Procedure: ESOPHAGOGASTRODUODENOSCOPY (EGD) WITH PROPOFOL;  Surgeon: Irving Copas., MD;  Location: [name] ENDOSCOPY;  Service: Endoscopy;  Laterality: N/A;   EUS N/A 11/01/2020   Procedure: UPPER ENDOSCOPIC ULTRASOUND (EUS) RADIAL;  Surgeon: Rush Landmark Telford Nab., MD;  Location: [name];  Service: Endoscopy;  Laterality: N/A;   HERNIA REPAIR     umbilical in the 22'L       Initial Focused Assessment (If applicable, or please see trauma documentation): Airway- clear Breathing Unlabored Circulation- lac to right inner eyebrow area, bleeding is controlled at present with 2x2 from EMS   CT's Completed:   CT Head and CT Maxillofacial    Interventions:  CT head and face Dermabond to laceration  Plan for disposition:  Discharge home    Event Summary: See primary RN's note    Bedside handoff with ED RN [name], RN.    [name]  Trauma Response RN  Please call TRN at [phone] for further assistance.

## 2022-04-02 NOTE — ED Triage Notes (Signed)
Pt was walking outside after eating when his shoes got caught on vinyl flooring and pt triped denies LOC landing on face with approx. 2 in lac to right eyebrow bleeding controlled and abrasion to the bridge of the nose

## 2022-04-10 DIAGNOSIS — E871 Hypo-osmolality and hyponatremia: Secondary | ICD-10-CM | POA: Diagnosis not present

## 2022-04-10 DIAGNOSIS — I25119 Atherosclerotic heart disease of native coronary artery with unspecified angina pectoris: Secondary | ICD-10-CM | POA: Diagnosis not present

## 2022-04-10 DIAGNOSIS — E119 Type 2 diabetes mellitus without complications: Secondary | ICD-10-CM | POA: Diagnosis not present

## 2022-04-10 DIAGNOSIS — E1159 Type 2 diabetes mellitus with other circulatory complications: Secondary | ICD-10-CM | POA: Diagnosis not present

## 2022-04-10 DIAGNOSIS — E1144 Type 2 diabetes mellitus with diabetic amyotrophy: Secondary | ICD-10-CM | POA: Diagnosis not present

## 2022-04-10 DIAGNOSIS — E291 Testicular hypofunction: Secondary | ICD-10-CM | POA: Diagnosis not present

## 2022-04-10 DIAGNOSIS — I152 Hypertension secondary to endocrine disorders: Secondary | ICD-10-CM | POA: Diagnosis not present

## 2022-04-10 DIAGNOSIS — I5022 Chronic systolic (congestive) heart failure: Secondary | ICD-10-CM | POA: Diagnosis not present

## 2022-04-10 DIAGNOSIS — E785 Hyperlipidemia, unspecified: Secondary | ICD-10-CM | POA: Diagnosis not present

## 2022-04-10 DIAGNOSIS — K219 Gastro-esophageal reflux disease without esophagitis: Secondary | ICD-10-CM | POA: Diagnosis not present

## 2022-04-10 DIAGNOSIS — E1169 Type 2 diabetes mellitus with other specified complication: Secondary | ICD-10-CM | POA: Diagnosis not present

## 2022-04-10 DIAGNOSIS — D539 Nutritional anemia, unspecified: Secondary | ICD-10-CM | POA: Diagnosis not present

## 2022-04-14 ENCOUNTER — Telehealth: Payer: Self-pay | Admitting: *Deleted

## 2022-04-14 NOTE — Telephone Encounter (Signed)
     Patient  visit on 04/02/2022  at [facility]  was for fall  Have you been able to follow up with your primary care physician? Feeling better from fall The patient was or was not able to obtain any needed medicine or equipment.  Are there diet recommendations that you are having difficulty following?  Patient expresses understanding of discharge instructions and education provided has no other needs at this time.    [place] [phone] [address] , [place] [address] Email : [email or web address]

## 2022-04-22 ENCOUNTER — Encounter: Payer: Self-pay | Admitting: *Deleted

## 2022-04-22 ENCOUNTER — Telehealth: Payer: Self-pay | Admitting: *Deleted

## 2022-04-22 NOTE — Patient Outreach (Signed)
  Care Coordination   Initial Visit Note   04/22/2022 Name: Aaron Rush MRN: [id number] DOB: 1945/09/05  Aaron Rush is a 76 y.o. year old male who sees Aaron Limbo, MD for primary care. I spoke with  Aaron Rush by phone today.  What matters to the patients health and wellness today?  "to maintain"    Goals Addressed               This Visit's Progress     COMPLETED: "to maintain" (pt-stated)        Care Coordination Interventions: Discussed importance of daily weight and advised patient to weigh and record daily Assessed social determinant of health barriers  Patient interviewed about adult health maintenance status including  importance of Annual Wellness Visit Provided education about importance of taking medications as prescribed, weighing daily and recording in management of CHF Patient reports he has not been instructed to weigh and states not going to weigh Care coordination program explained to patient who is agreeable to today's outreach but declines any further outreach citing "don't need anything"         SDOH assessments and interventions completed:  Yes  SDOH Interventions Today    Flowsheet Row Most Recent Value  SDOH Interventions   Food Insecurity Interventions Intervention Not Indicated  Transportation Interventions Intervention Not Indicated        Care Coordination Interventions Activated:  Yes  Care Coordination Interventions:  Yes, provided   Follow up plan: No further intervention required.   Encounter Outcome:  Pt. Visit Completed   Aaron Rush [facility], BSN [facility] RN Care Coordinator [phone]

## 2022-04-23 DIAGNOSIS — I255 Ischemic cardiomyopathy: Secondary | ICD-10-CM | POA: Diagnosis not present

## 2022-04-23 DIAGNOSIS — I5022 Chronic systolic (congestive) heart failure: Secondary | ICD-10-CM | POA: Diagnosis not present

## 2022-04-23 DIAGNOSIS — E1159 Type 2 diabetes mellitus with other circulatory complications: Secondary | ICD-10-CM | POA: Diagnosis not present

## 2022-04-23 DIAGNOSIS — I25119 Atherosclerotic heart disease of native coronary artery with unspecified angina pectoris: Secondary | ICD-10-CM | POA: Diagnosis not present

## 2022-04-23 DIAGNOSIS — I152 Hypertension secondary to endocrine disorders: Secondary | ICD-10-CM | POA: Diagnosis not present

## 2022-04-29 DIAGNOSIS — I2510 Atherosclerotic heart disease of native coronary artery without angina pectoris: Secondary | ICD-10-CM | POA: Diagnosis not present

## 2022-04-29 DIAGNOSIS — E1159 Type 2 diabetes mellitus with other circulatory complications: Secondary | ICD-10-CM | POA: Diagnosis not present

## 2022-04-29 DIAGNOSIS — I152 Hypertension secondary to endocrine disorders: Secondary | ICD-10-CM | POA: Diagnosis not present

## 2022-05-06 DIAGNOSIS — Z01811 Encounter for preprocedural respiratory examination: Secondary | ICD-10-CM | POA: Diagnosis not present

## 2022-05-06 DIAGNOSIS — K449 Diaphragmatic hernia without obstruction or gangrene: Secondary | ICD-10-CM | POA: Diagnosis not present

## 2022-05-06 DIAGNOSIS — R079 Chest pain, unspecified: Secondary | ICD-10-CM | POA: Diagnosis not present

## 2022-05-07 DIAGNOSIS — R918 Other nonspecific abnormal finding of lung field: Secondary | ICD-10-CM | POA: Diagnosis not present

## 2022-05-07 DIAGNOSIS — Z7982 Long term (current) use of aspirin: Secondary | ICD-10-CM | POA: Diagnosis not present

## 2022-05-07 DIAGNOSIS — I2510 Atherosclerotic heart disease of native coronary artery without angina pectoris: Secondary | ICD-10-CM | POA: Diagnosis not present

## 2022-05-07 DIAGNOSIS — E1165 Type 2 diabetes mellitus with hyperglycemia: Secondary | ICD-10-CM | POA: Diagnosis not present

## 2022-05-07 DIAGNOSIS — Z8673 Personal history of transient ischemic attack (TIA), and cerebral infarction without residual deficits: Secondary | ICD-10-CM | POA: Diagnosis not present

## 2022-05-07 DIAGNOSIS — R0602 Shortness of breath: Secondary | ICD-10-CM | POA: Diagnosis not present

## 2022-05-07 DIAGNOSIS — I110 Hypertensive heart disease with heart failure: Secondary | ICD-10-CM | POA: Diagnosis not present

## 2022-05-07 DIAGNOSIS — Z48812 Encounter for surgical aftercare following surgery on the circulatory system: Secondary | ICD-10-CM | POA: Diagnosis not present

## 2022-05-07 DIAGNOSIS — I255 Ischemic cardiomyopathy: Secondary | ICD-10-CM | POA: Diagnosis not present

## 2022-05-07 DIAGNOSIS — I252 Old myocardial infarction: Secondary | ICD-10-CM | POA: Diagnosis not present

## 2022-05-07 DIAGNOSIS — J811 Chronic pulmonary edema: Secondary | ICD-10-CM | POA: Diagnosis not present

## 2022-05-07 DIAGNOSIS — I2584 Coronary atherosclerosis due to calcified coronary lesion: Secondary | ICD-10-CM | POA: Diagnosis not present

## 2022-05-07 DIAGNOSIS — Z79899 Other long term (current) drug therapy: Secondary | ICD-10-CM | POA: Diagnosis not present

## 2022-05-07 DIAGNOSIS — I5022 Chronic systolic (congestive) heart failure: Secondary | ICD-10-CM | POA: Diagnosis not present

## 2022-05-07 DIAGNOSIS — E119 Type 2 diabetes mellitus without complications: Secondary | ICD-10-CM | POA: Diagnosis not present

## 2022-05-07 DIAGNOSIS — I25118 Atherosclerotic heart disease of native coronary artery with other forms of angina pectoris: Secondary | ICD-10-CM | POA: Diagnosis not present

## 2022-05-07 DIAGNOSIS — E785 Hyperlipidemia, unspecified: Secondary | ICD-10-CM | POA: Diagnosis not present

## 2022-05-07 DIAGNOSIS — E1159 Type 2 diabetes mellitus with other circulatory complications: Secondary | ICD-10-CM | POA: Diagnosis not present

## 2022-05-07 DIAGNOSIS — J90 Pleural effusion, not elsewhere classified: Secondary | ICD-10-CM | POA: Diagnosis not present

## 2022-05-07 DIAGNOSIS — I088 Other rheumatic multiple valve diseases: Secondary | ICD-10-CM | POA: Diagnosis not present

## 2022-05-07 DIAGNOSIS — J9811 Atelectasis: Secondary | ICD-10-CM | POA: Diagnosis not present

## 2022-05-07 DIAGNOSIS — J9383 Other pneumothorax: Secondary | ICD-10-CM | POA: Diagnosis not present

## 2022-05-07 DIAGNOSIS — I082 Rheumatic disorders of both aortic and tricuspid valves: Secondary | ICD-10-CM | POA: Diagnosis not present

## 2022-05-07 DIAGNOSIS — Z8616 Personal history of COVID-19: Secondary | ICD-10-CM | POA: Diagnosis not present

## 2022-05-07 DIAGNOSIS — D62 Acute posthemorrhagic anemia: Secondary | ICD-10-CM | POA: Diagnosis not present

## 2022-05-07 DIAGNOSIS — Z951 Presence of aortocoronary bypass graft: Secondary | ICD-10-CM | POA: Diagnosis not present

## 2022-05-07 DIAGNOSIS — Z6332 Other absence of family member: Secondary | ICD-10-CM | POA: Diagnosis not present

## 2022-05-07 DIAGNOSIS — D649 Anemia, unspecified: Secondary | ICD-10-CM | POA: Diagnosis not present

## 2022-05-07 DIAGNOSIS — Z91199 Patient's noncompliance with other medical treatment and regimen due to unspecified reason: Secondary | ICD-10-CM | POA: Diagnosis not present

## 2022-05-07 DIAGNOSIS — I10 Essential (primary) hypertension: Secondary | ICD-10-CM | POA: Diagnosis not present

## 2022-05-07 DIAGNOSIS — E1144 Type 2 diabetes mellitus with diabetic amyotrophy: Secondary | ICD-10-CM | POA: Diagnosis not present

## 2022-05-07 DIAGNOSIS — R262 Difficulty in walking, not elsewhere classified: Secondary | ICD-10-CM | POA: Diagnosis not present

## 2022-05-14 DIAGNOSIS — Z951 Presence of aortocoronary bypass graft: Secondary | ICD-10-CM | POA: Diagnosis not present

## 2022-05-14 DIAGNOSIS — E1165 Type 2 diabetes mellitus with hyperglycemia: Secondary | ICD-10-CM | POA: Diagnosis not present

## 2022-05-23 DIAGNOSIS — E1159 Type 2 diabetes mellitus with other circulatory complications: Secondary | ICD-10-CM | POA: Diagnosis not present

## 2022-05-23 DIAGNOSIS — I25119 Atherosclerotic heart disease of native coronary artery with unspecified angina pectoris: Secondary | ICD-10-CM | POA: Diagnosis not present

## 2022-06-25 ENCOUNTER — Other Ambulatory Visit: Payer: Self-pay | Admitting: Hematology and Oncology

## 2022-06-25 ENCOUNTER — Other Ambulatory Visit: Payer: Self-pay

## 2022-06-25 ENCOUNTER — Inpatient Hospital Stay: Payer: Medicare HMO | Attending: Hematology and Oncology | Admitting: Hematology and Oncology

## 2022-06-25 ENCOUNTER — Inpatient Hospital Stay: Payer: Medicare HMO

## 2022-06-25 VITALS — BP 165/98 | HR 78 | Temp 97.4°F | Resp 14 | Wt 145.2 lb

## 2022-06-25 DIAGNOSIS — E1140 Type 2 diabetes mellitus with diabetic neuropathy, unspecified: Secondary | ICD-10-CM | POA: Diagnosis not present

## 2022-06-25 DIAGNOSIS — D649 Anemia, unspecified: Secondary | ICD-10-CM | POA: Insufficient documentation

## 2022-06-25 DIAGNOSIS — I509 Heart failure, unspecified: Secondary | ICD-10-CM | POA: Diagnosis not present

## 2022-06-25 DIAGNOSIS — D539 Nutritional anemia, unspecified: Secondary | ICD-10-CM

## 2022-06-25 DIAGNOSIS — Z951 Presence of aortocoronary bypass graft: Secondary | ICD-10-CM | POA: Diagnosis not present

## 2022-06-25 LAB — CBC WITH DIFFERENTIAL (CANCER CENTER ONLY)
Abs Immature Granulocytes: 0.02 10*3/uL (ref 0.00–0.07)
Basophils Absolute: 0.1 10*3/uL (ref 0.0–0.1)
Basophils Relative: 1 %
Eosinophils Absolute: 0.3 10*3/uL (ref 0.0–0.5)
Eosinophils Relative: 4 %
HCT: 38.8 % — ABNORMAL LOW (ref 39.0–52.0)
Hemoglobin: 12.9 g/dL — ABNORMAL LOW (ref 13.0–17.0)
Immature Granulocytes: 0 %
Lymphocytes Relative: 24 %
Lymphs Abs: 2.1 10*3/uL (ref 0.7–4.0)
MCH: 32.5 pg (ref 26.0–34.0)
MCHC: 33.2 g/dL (ref 30.0–36.0)
MCV: 97.7 fL (ref 80.0–100.0)
Monocytes Absolute: 0.7 10*3/uL (ref 0.1–1.0)
Monocytes Relative: 8 %
Neutro Abs: 5.5 10*3/uL (ref 1.7–7.7)
Neutrophils Relative %: 63 %
Platelet Count: 263 10*3/uL (ref 150–400)
RBC: 3.97 MIL/uL — ABNORMAL LOW (ref 4.22–5.81)
RDW: 14 % (ref 11.5–15.5)
WBC Count: 8.7 10*3/uL (ref 4.0–10.5)
nRBC: 0 % (ref 0.0–0.2)

## 2022-06-25 LAB — RETIC PANEL
Immature Retic Fract: 21.6 % — ABNORMAL HIGH (ref 2.3–15.9)
RBC.: 3.96 MIL/uL — ABNORMAL LOW (ref 4.22–5.81)
Retic Count, Absolute: 68.1 10*3/uL (ref 19.0–186.0)
Retic Ct Pct: 1.7 % (ref 0.4–3.1)
Reticulocyte Hemoglobin: 35.9 pg (ref >27.9)

## 2022-06-25 LAB — CMP (CANCER CENTER ONLY)
ALT: 25 U/L (ref 0–44)
AST: 21 U/L (ref 15–41)
Albumin: 4 g/dL (ref 3.5–5.0)
Alkaline Phosphatase: 101 U/L (ref 38–126)
Anion gap: 7 (ref 5–15)
BUN: 18 mg/dL (ref 8–23)
CO2: 26 mmol/L (ref 22–32)
Calcium: 9.5 mg/dL (ref 8.9–10.3)
Chloride: 105 mmol/L (ref 98–111)
Creatinine: 0.72 mg/dL (ref 0.61–1.24)
GFR, Estimated: >60 mL/min (ref >60)
Glucose, Bld: 107 mg/dL — ABNORMAL HIGH (ref 70–99)
Potassium: 4.4 mmol/L (ref 3.5–5.1)
Sodium: 138 mmol/L (ref 135–145)
Total Bilirubin: 0.5 mg/dL (ref 0.3–1.2)
Total Protein: 7.5 g/dL (ref 6.5–8.1)

## 2022-06-25 LAB — IRON AND IRON BINDING CAPACITY (CC-WL,HP ONLY)
Iron: 69 ug/dL (ref 45–182)
Saturation Ratios: 19 % (ref 17.9–39.5)
TIBC: 368 ug/dL (ref 250–450)
UIBC: 299 ug/dL

## 2022-06-25 NOTE — Progress Notes (Signed)
[facility] [phone]   [phone]  PROGRESS NOTE  Patient Care Team: Bernerd Limbo, MD as PCP - General (Family Medicine) Alda Berthold, DO as Consulting Physician (Neurology)  Hematological/Oncological History # Normocytic Anemia 1) Labs from PCP, Bridget Hartshorn NP -07/03/2020: WBC 6.1, Hgb 12.6, MCV 101.4, Plt 343 -09/11/2020: WBC 7.1, Hgb 12.6, MCV 101.9, Plt 286 -11/06/2020:WBC 5.7, Hgb 11.8 , MCV 98.0, Plt 232  2) 11/01/2020: EUS/EGD: No gross lesions. Salmon-colored mucosa present from 36-37 cm, Large 6 cm hiatal hernia present, gastritis in antrum/body. Pancreatic parenchymal abnormalities consisting of atrophy, lobularity with honeycombing, hyperechoic foci without shadowing and hyperechoic strands were noted in the entire pancreas.  3) 12/03/2020: Establish care with Dede Query PA-C  4) 12/12/2020: Colonoscopy: Two 4 to 7 mm polyps in the sigmoid colon, removed with a cold snare. Resected and retrieved.Diverticulosis in the entire examined colon.Internal hemorrhoids.The examination was otherwise normal.  HISTORY OF PRESENTING ILLNESS:  Aaron Rush 76 y.o. 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   On exam today, 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  He notes the surgery went well.  He did not have any complications and he only required Tylenol for pain control following the procedure.  The scar is healing up well.  He notes he is having no difference in his energy levels.  He reports his energy about an 8 or 9 out of 10 at this time.  He notes that he is functioning quite well with no limitations in his day-to-day activities.  He is eating quite well though due to having type 2 diabetes he does not eat a whole lot of red meat.  His weight has increased up to 145 pounds from 136 pounds.  He notes he is not  having any trouble with shortness of breath or overt signs of bleeding.  He is taking vitamin B12 pills.  He is not having any weakness, lightheadedness, dizziness, or shortness of breath.  He reportedly did not receive any blood transfusions during his surgery.  Patient denies fevers, chills, night sweats, shortness of breath, chest pain or cough.  He has no other complaints.  Rest of the 10 point ROS is below.  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   Dx at [place] visit in 05-2020    SURGICAL HISTORY: Past Surgical History:  Procedure Laterality Date   BIOPSY  11/01/2020   Procedure: BIOPSY;  Surgeon: Irving Copas., MD;  Location: [facility] ENDOSCOPY;  Service: Endoscopy;;   ESOPHAGOGASTRODUODENOSCOPY     ESOPHAGOGASTRODUODENOSCOPY (EGD) WITH PROPOFOL N/A 11/01/2020   Procedure: ESOPHAGOGASTRODUODENOSCOPY (EGD) WITH PROPOFOL;  Surgeon: Irving Copas., MD;  Location: [facility] ENDOSCOPY;  Service: Endoscopy;  Laterality: N/A;   EUS N/A 11/01/2020   Procedure: UPPER ENDOSCOPIC ULTRASOUND (EUS) RADIAL;  Surgeon: Rush Landmark Telford Nab., MD;  Location: [place];  Service: Endoscopy;  Laterality: N/A;   HERNIA REPAIR     umbilical  in the 70's    SOCIAL HISTORY: Social History   Socioeconomic History   Marital status: Divorced    Spouse name: Not on file   Number of children: 0   Years of education: Not on file   Highest education level: Not on file  Occupational History   Occupation: on leave from [employer]  Tobacco Use   Smoking status: Never   Smokeless tobacco: Never  Vaping Use   Vaping Use: Never used  Substance and Sexual  Activity   Alcohol use: Never    Comment: stopped 12/14/1980   Drug use: Never   Sexual activity: Not on file  Other Topics Concern   Not on file  Social History Narrative   Right Handed   One Story Home    Drinks caffeine   Social Determinants of Health   Financial Resource Strain: Not on file  Food Insecurity: No Food Insecurity (04/22/2022)   Hunger Vital Sign    Worried About Running Out of Food in the Last Year: Never true    Ran Out of Food in the Last Year: Never true  Transportation Needs: No Transportation Needs (04/22/2022)   PRAPARE - Hydrologist (Medical): No    Lack of Transportation (Non-Medical): No  Physical Activity: Not on file  Stress: Not on file  Social Connections: Not on file  Intimate Partner Violence: Not on file    FAMILY HISTORY: Family History  Problem Relation Age of Onset   COPD Father    Colon cancer Neg Hx    Esophageal cancer Neg Hx    Stomach cancer Neg Hx    Rectal cancer Neg Hx    Pancreatic cancer Neg Hx     ALLERGIES:  has No Known Allergies.  MEDICATIONS:  Current Outpatient Medications  Medication Sig Dispense Refill   carvedilol (COREG) 3.125 MG tablet Take 3.125 mg by mouth 2 (two) times daily.     CVS CALCIUM 600 MG tablet Take 600 mg by mouth 2 (two) times daily.     Ensure Plus (ENSURE PLUS) LIQD Take 237 mLs by mouth 2 (two) times daily between meals.     ENTRESTO 24-26 MG Take 1 tablet by mouth 2 (two) times daily.     furosemide (LASIX) 20 MG tablet Take 20 mg by mouth daily.     glipiZIDE (GLUCOTROL) 5 MG tablet Take 5 mg by mouth daily before breakfast.     Multiple Vitamin (MULTIVITAMIN WITH MINERALS) TABS tablet Take 1 tablet by mouth daily. 90 tablet 1   pantoprazole (PROTONIX) 40 MG tablet Take 1 tablet (40 mg total) by mouth daily. 30 tablet 5   spironolactone (ALDACTONE) 25 MG tablet Take 12.5 mg by mouth daily.     Testosterone 12.5 MG/ACT (1%) GEL Place onto the skin. 2 pumps on  each arm each morning     Thiamine HCl (VITAMIN B-1) 250 MG tablet Take 250 mg by mouth daily.     vitamin B-12 (CYANOCOBALAMIN) 1000 MCG tablet Take 1,000 mcg by mouth daily.     vitamin C (ASCORBIC ACID) 500 MG tablet Take 500 mg by mouth daily.     No current facility-administered medications for this visit.    REVIEW OF SYSTEMS:   Constitutional: ( - ) fevers, ( - )  chills , ( - ) night sweats Eyes: ( - ) blurriness of vision, ( - ) double vision, ( - ) watery eyes Ears, nose, mouth, throat, and face: ( - )  mucositis, ( - ) sore throat Respiratory: ( - ) cough, ( - ) dyspnea, ( - ) wheezes Cardiovascular: ( - ) palpitation, ( - ) chest discomfort, ( - ) lower extremity swelling Gastrointestinal:  ( - ) nausea, ( - ) heartburn, ( - ) change in bowel habits Skin: ( - ) abnormal skin rashes Lymphatics: ( - ) new lymphadenopathy, ( - ) easy bruising Neurological: ( - ) numbness, ( - ) tingling, ( - ) new weaknesses Behavioral/Psych: ( - ) mood change, ( - ) new changes  All other systems were reviewed with the patient and are negative.  PHYSICAL EXAMINATION: ECOG PERFORMANCE STATUS: 1 - Symptomatic but completely ambulatory  Vitals:   06/25/22 1440  BP: (!) 165/98  Pulse: 78  Resp: 14  Temp: (!) 97.4 F (36.3 C)  SpO2: 100%    Filed Weights   06/25/22 1440  Weight: 145 lb 3.2 oz (65.9 kg)     GENERAL: thin male in NAD  SKIN: skin color, texture, turgor are normal, no rashes or significant lesions EYES: conjunctiva are pink and non-injected, sclera clear LUNGS: clear to auscultation and percussion with normal breathing effort HEART: regular rate & rhythm and no murmurs and no lower extremity edema Musculoskeletal: no cyanosis of digits and no clubbing  PSYCH: alert & oriented x 3, fluent speech NEURO: no focal motor/sensory deficits  LABORATORY DATA:  I have reviewed the data as listed    Latest Ref Rng & Units 06/25/2022    2:23 PM 03/20/2022    2:31 PM 09/17/2021     2:45 PM  CBC  WBC 4.0 - 10.5 K/uL 8.7  8.7  7.2   Hemoglobin 13.0 - 17.0 g/dL 12.9  13.3  13.7   Hematocrit 39.0 - 52.0 % 38.8  37.3  39.1   Platelets 150 - 400 K/uL 263  196  228        Latest Ref Rng & Units 06/25/2022    2:23 PM 03/20/2022    2:31 PM 09/17/2021    2:45 PM  CMP  Glucose 70 - 99 mg/dL 107  189  198   BUN 8 - 23 mg/dL _0 Creatinine 0.61 - 1.24 mg/dL 0.72  0.82  0.88   Sodium 135 - 145 mmol/L 138  135  135   Potassium 3.5 - 5.1 mmol/L 4.4  3.9  4.3   Chloride 98 - 111 mmol/L 105  102  101   CO2 22 - 32 mmol/L _1 Calcium 8.9 - 10.3 mg/dL 9.5  9.6  10.0   Total Protein 6.5 - 8.1 g/dL 7.5  7.2  7.3   Total Bilirubin 0.3 - 1.2 mg/dL 0.5  0.5  0.5   Alkaline Phos 38 - 126 U/L 101  56  72   AST 15 - 41 U/L _2 ALT 0 - 44 U/L _3 ASSESSMENT & PLAN Kennet Mccort is a 76 y.o. male returns for a follow up for anemia.   #Normocytic Anemia, resolved: --Etiology unknown. --Patient currently takes vitamin B12 1000 mcg once daily.  --Patient denies any signs of bleeding. Colonoscopy from 12/12/2020 showed no evidence of bleeding.  --Workup from 12/03/2020 ruled out iron deficiency, vitamin B12 deficiency, folate deficiency and paraproteinemia. --Labs today show that anemia has resolved with Hgb of 12.9. Rest of the labs were reviewed and require no  intervention.  --Since anemia has resolved and patient is feeling better/gaining weight, we will monitor for now. Consider bone marrow biopsy if anemia worsens.  --RTC PRN.  Of note the patient is having an open heart surgery in October 2023.  His requested blood counts shortly after the surgery in order to assure that he does not become anemic postoperatively.  We will order the studies and if normal there is no need for routine follow-up.  If he becomes anemic we will need to evaluate further.  No orders of the defined types were placed in this encounter.    All questions were answered.  The patient knows to call the clinic with any problems, questions or concerns.  I have spent a total of 30 minutes minutes of face-to-face and non-face-to-face time, preparing to see the patient, performing a medically appropriate examination, counseling and educating the patient, documenting clinical information in the electronic health record, independently interpreting results and care coordination.   Ledell Peoples, MD Department of Hematology/Oncology [place] at [facility] Phone: [phone] Pager: [phone] Email: [email or web address]

## 2022-06-26 LAB — FERRITIN: Ferritin: 66 ng/mL (ref 24–336)

## 2022-06-26 IMAGING — XA DG SPINAL PUNCT LUMBAR DIAG WITH FL CT GUIDANCE
1 series · 1 of 1 positions shown · non-contrast
Comparison: none

CLINICAL DATA: Neuropathy.  Left foot drop.

[Series 1: ortho adipose · 1 of 1 slices shown]
[im 1/1]
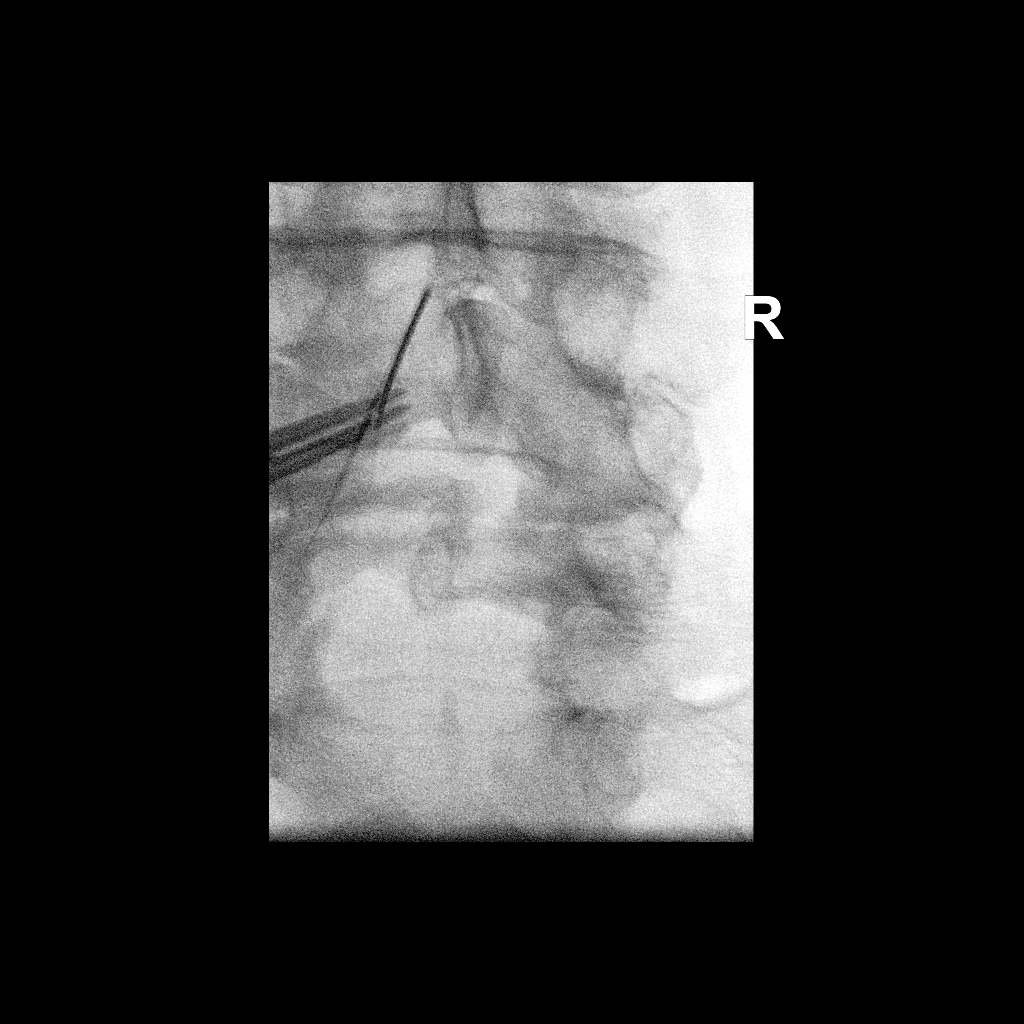

[1 of 1 positions shown; findings below may reference images not displayed]

EXAM:
DIAGNOSTIC LUMBAR PUNCTURE UNDER FLUOROSCOPIC GUIDANCE

FLUOROSCOPY TIME:  Fluoroscopy Time: 0 minutes 13 seconds.
micro gray meter squared

PROCEDURE:
Informed consent was obtained from the patient prior to the
procedure, including potential complications of headache, allergy,
and pain. With the patient prone, the lower back was prepped with
Betadine. 1% Lidocaine was used for local anesthesia. Lumbar
puncture was performed at the left L3-4 level using a 20 gauge
needle with return of clear CSF with an opening pressure of 13 cm
water. Seventeen ml of CSF were obtained for laboratory studies. The
patient tolerated the procedure well and there were no apparent
complications.
IMPRESSION: Lumbar puncture on the left at L3-4. Normal opening pressure.
Samples sent for requested studies.

## 2022-07-10 DIAGNOSIS — I255 Ischemic cardiomyopathy: Secondary | ICD-10-CM | POA: Diagnosis not present

## 2022-07-10 DIAGNOSIS — E1159 Type 2 diabetes mellitus with other circulatory complications: Secondary | ICD-10-CM | POA: Diagnosis not present

## 2022-07-10 DIAGNOSIS — I5022 Chronic systolic (congestive) heart failure: Secondary | ICD-10-CM | POA: Diagnosis not present

## 2022-07-10 DIAGNOSIS — D539 Nutritional anemia, unspecified: Secondary | ICD-10-CM | POA: Diagnosis not present

## 2022-07-10 DIAGNOSIS — Z0000 Encounter for general adult medical examination without abnormal findings: Secondary | ICD-10-CM | POA: Diagnosis not present

## 2022-07-10 DIAGNOSIS — I152 Hypertension secondary to endocrine disorders: Secondary | ICD-10-CM | POA: Diagnosis not present

## 2022-07-10 DIAGNOSIS — I2510 Atherosclerotic heart disease of native coronary artery without angina pectoris: Secondary | ICD-10-CM | POA: Diagnosis not present

## 2022-07-10 DIAGNOSIS — E1151 Type 2 diabetes mellitus with diabetic peripheral angiopathy without gangrene: Secondary | ICD-10-CM | POA: Diagnosis not present

## 2022-07-10 DIAGNOSIS — I25119 Atherosclerotic heart disease of native coronary artery with unspecified angina pectoris: Secondary | ICD-10-CM | POA: Diagnosis not present

## 2022-07-10 DIAGNOSIS — E871 Hypo-osmolality and hyponatremia: Secondary | ICD-10-CM | POA: Diagnosis not present

## 2022-07-10 DIAGNOSIS — E538 Deficiency of other specified B group vitamins: Secondary | ICD-10-CM | POA: Diagnosis not present

## 2022-07-10 DIAGNOSIS — R7989 Other specified abnormal findings of blood chemistry: Secondary | ICD-10-CM | POA: Diagnosis not present

## 2022-07-16 DIAGNOSIS — I25119 Atherosclerotic heart disease of native coronary artery with unspecified angina pectoris: Secondary | ICD-10-CM | POA: Diagnosis not present

## 2022-07-16 DIAGNOSIS — I5022 Chronic systolic (congestive) heart failure: Secondary | ICD-10-CM | POA: Diagnosis not present

## 2022-07-16 DIAGNOSIS — I255 Ischemic cardiomyopathy: Secondary | ICD-10-CM | POA: Diagnosis not present

## 2022-10-15 DIAGNOSIS — I255 Ischemic cardiomyopathy: Secondary | ICD-10-CM | POA: Diagnosis not present

## 2022-10-15 DIAGNOSIS — I25119 Atherosclerotic heart disease of native coronary artery with unspecified angina pectoris: Secondary | ICD-10-CM | POA: Diagnosis not present

## 2022-10-15 DIAGNOSIS — I5022 Chronic systolic (congestive) heart failure: Secondary | ICD-10-CM | POA: Diagnosis not present

## 2022-11-12 DIAGNOSIS — I152 Hypertension secondary to endocrine disorders: Secondary | ICD-10-CM | POA: Diagnosis not present

## 2022-11-12 DIAGNOSIS — E1159 Type 2 diabetes mellitus with other circulatory complications: Secondary | ICD-10-CM | POA: Diagnosis not present

## 2022-11-12 DIAGNOSIS — I255 Ischemic cardiomyopathy: Secondary | ICD-10-CM | POA: Diagnosis not present

## 2022-11-12 DIAGNOSIS — E291 Testicular hypofunction: Secondary | ICD-10-CM | POA: Diagnosis not present

## 2022-11-12 DIAGNOSIS — E785 Hyperlipidemia, unspecified: Secondary | ICD-10-CM | POA: Diagnosis not present

## 2022-11-12 DIAGNOSIS — E1151 Type 2 diabetes mellitus with diabetic peripheral angiopathy without gangrene: Secondary | ICD-10-CM | POA: Diagnosis not present

## 2022-11-12 DIAGNOSIS — I25119 Atherosclerotic heart disease of native coronary artery with unspecified angina pectoris: Secondary | ICD-10-CM | POA: Diagnosis not present

## 2022-11-12 DIAGNOSIS — E871 Hypo-osmolality and hyponatremia: Secondary | ICD-10-CM | POA: Diagnosis not present

## 2022-11-12 DIAGNOSIS — K219 Gastro-esophageal reflux disease without esophagitis: Secondary | ICD-10-CM | POA: Diagnosis not present

## 2022-11-12 DIAGNOSIS — E1169 Type 2 diabetes mellitus with other specified complication: Secondary | ICD-10-CM | POA: Diagnosis not present

## 2022-11-12 DIAGNOSIS — I5022 Chronic systolic (congestive) heart failure: Secondary | ICD-10-CM | POA: Diagnosis not present

## 2022-11-14 DIAGNOSIS — I152 Hypertension secondary to endocrine disorders: Secondary | ICD-10-CM | POA: Diagnosis not present

## 2022-11-14 DIAGNOSIS — I255 Ischemic cardiomyopathy: Secondary | ICD-10-CM | POA: Diagnosis not present

## 2022-11-14 DIAGNOSIS — I25119 Atherosclerotic heart disease of native coronary artery with unspecified angina pectoris: Secondary | ICD-10-CM | POA: Diagnosis not present

## 2022-11-14 DIAGNOSIS — I2510 Atherosclerotic heart disease of native coronary artery without angina pectoris: Secondary | ICD-10-CM | POA: Diagnosis not present

## 2022-11-14 DIAGNOSIS — E1159 Type 2 diabetes mellitus with other circulatory complications: Secondary | ICD-10-CM | POA: Diagnosis not present

## 2023-03-18 DIAGNOSIS — M21372 Foot drop, left foot: Secondary | ICD-10-CM | POA: Diagnosis not present

## 2023-03-18 DIAGNOSIS — K219 Gastro-esophageal reflux disease without esophagitis: Secondary | ICD-10-CM | POA: Diagnosis not present

## 2023-03-18 DIAGNOSIS — E871 Hypo-osmolality and hyponatremia: Secondary | ICD-10-CM | POA: Diagnosis not present

## 2023-03-18 DIAGNOSIS — E1151 Type 2 diabetes mellitus with diabetic peripheral angiopathy without gangrene: Secondary | ICD-10-CM | POA: Diagnosis not present

## 2023-03-18 DIAGNOSIS — I5022 Chronic systolic (congestive) heart failure: Secondary | ICD-10-CM | POA: Diagnosis not present

## 2023-03-18 DIAGNOSIS — E1169 Type 2 diabetes mellitus with other specified complication: Secondary | ICD-10-CM | POA: Diagnosis not present

## 2023-03-18 DIAGNOSIS — I2510 Atherosclerotic heart disease of native coronary artery without angina pectoris: Secondary | ICD-10-CM | POA: Diagnosis not present

## 2023-03-18 DIAGNOSIS — E291 Testicular hypofunction: Secondary | ICD-10-CM | POA: Diagnosis not present

## 2023-03-18 DIAGNOSIS — E785 Hyperlipidemia, unspecified: Secondary | ICD-10-CM | POA: Diagnosis not present

## 2023-03-18 DIAGNOSIS — I25119 Atherosclerotic heart disease of native coronary artery with unspecified angina pectoris: Secondary | ICD-10-CM | POA: Diagnosis not present

## 2023-03-18 DIAGNOSIS — I255 Ischemic cardiomyopathy: Secondary | ICD-10-CM | POA: Diagnosis not present

## 2023-04-15 DIAGNOSIS — R2689 Other abnormalities of gait and mobility: Secondary | ICD-10-CM | POA: Diagnosis not present

## 2023-04-15 DIAGNOSIS — R2681 Unsteadiness on feet: Secondary | ICD-10-CM | POA: Diagnosis not present

## 2023-04-15 DIAGNOSIS — M21372 Foot drop, left foot: Secondary | ICD-10-CM | POA: Diagnosis not present

## 2023-04-15 DIAGNOSIS — R531 Weakness: Secondary | ICD-10-CM | POA: Diagnosis not present

## 2023-04-23 DIAGNOSIS — R531 Weakness: Secondary | ICD-10-CM | POA: Diagnosis not present

## 2023-04-23 DIAGNOSIS — R2689 Other abnormalities of gait and mobility: Secondary | ICD-10-CM | POA: Diagnosis not present

## 2023-04-23 DIAGNOSIS — M21372 Foot drop, left foot: Secondary | ICD-10-CM | POA: Diagnosis not present

## 2023-04-23 DIAGNOSIS — R2681 Unsteadiness on feet: Secondary | ICD-10-CM | POA: Diagnosis not present

## 2023-04-27 DIAGNOSIS — R2681 Unsteadiness on feet: Secondary | ICD-10-CM | POA: Diagnosis not present

## 2023-04-27 DIAGNOSIS — M21372 Foot drop, left foot: Secondary | ICD-10-CM | POA: Diagnosis not present

## 2023-04-27 DIAGNOSIS — R531 Weakness: Secondary | ICD-10-CM | POA: Diagnosis not present

## 2023-04-27 DIAGNOSIS — R2689 Other abnormalities of gait and mobility: Secondary | ICD-10-CM | POA: Diagnosis not present

## 2023-04-29 DIAGNOSIS — R2681 Unsteadiness on feet: Secondary | ICD-10-CM | POA: Diagnosis not present

## 2023-04-29 DIAGNOSIS — R531 Weakness: Secondary | ICD-10-CM | POA: Diagnosis not present

## 2023-04-29 DIAGNOSIS — R2689 Other abnormalities of gait and mobility: Secondary | ICD-10-CM | POA: Diagnosis not present

## 2023-04-29 DIAGNOSIS — M21372 Foot drop, left foot: Secondary | ICD-10-CM | POA: Diagnosis not present

## 2023-05-04 DIAGNOSIS — R2681 Unsteadiness on feet: Secondary | ICD-10-CM | POA: Diagnosis not present

## 2023-05-04 DIAGNOSIS — R2689 Other abnormalities of gait and mobility: Secondary | ICD-10-CM | POA: Diagnosis not present

## 2023-05-04 DIAGNOSIS — R531 Weakness: Secondary | ICD-10-CM | POA: Diagnosis not present

## 2023-05-04 DIAGNOSIS — M21372 Foot drop, left foot: Secondary | ICD-10-CM | POA: Diagnosis not present

## 2023-05-06 DIAGNOSIS — R2681 Unsteadiness on feet: Secondary | ICD-10-CM | POA: Diagnosis not present

## 2023-05-06 DIAGNOSIS — R531 Weakness: Secondary | ICD-10-CM | POA: Diagnosis not present

## 2023-05-06 DIAGNOSIS — R2689 Other abnormalities of gait and mobility: Secondary | ICD-10-CM | POA: Diagnosis not present

## 2023-05-06 DIAGNOSIS — M21372 Foot drop, left foot: Secondary | ICD-10-CM | POA: Diagnosis not present

## 2023-05-11 DIAGNOSIS — R531 Weakness: Secondary | ICD-10-CM | POA: Diagnosis not present

## 2023-05-11 DIAGNOSIS — R2689 Other abnormalities of gait and mobility: Secondary | ICD-10-CM | POA: Diagnosis not present

## 2023-05-11 DIAGNOSIS — R2681 Unsteadiness on feet: Secondary | ICD-10-CM | POA: Diagnosis not present

## 2023-05-11 DIAGNOSIS — M21372 Foot drop, left foot: Secondary | ICD-10-CM | POA: Diagnosis not present

## 2023-05-13 DIAGNOSIS — R531 Weakness: Secondary | ICD-10-CM | POA: Diagnosis not present

## 2023-05-13 DIAGNOSIS — M21372 Foot drop, left foot: Secondary | ICD-10-CM | POA: Diagnosis not present

## 2023-05-13 DIAGNOSIS — R2681 Unsteadiness on feet: Secondary | ICD-10-CM | POA: Diagnosis not present

## 2023-05-13 DIAGNOSIS — R2689 Other abnormalities of gait and mobility: Secondary | ICD-10-CM | POA: Diagnosis not present

## 2023-05-18 DIAGNOSIS — I255 Ischemic cardiomyopathy: Secondary | ICD-10-CM | POA: Diagnosis not present

## 2023-05-18 DIAGNOSIS — I152 Hypertension secondary to endocrine disorders: Secondary | ICD-10-CM | POA: Diagnosis not present

## 2023-05-18 DIAGNOSIS — I25119 Atherosclerotic heart disease of native coronary artery with unspecified angina pectoris: Secondary | ICD-10-CM | POA: Diagnosis not present

## 2023-05-18 DIAGNOSIS — I2510 Atherosclerotic heart disease of native coronary artery without angina pectoris: Secondary | ICD-10-CM | POA: Diagnosis not present

## 2023-05-18 DIAGNOSIS — E1159 Type 2 diabetes mellitus with other circulatory complications: Secondary | ICD-10-CM | POA: Diagnosis not present

## 2023-05-21 DIAGNOSIS — M21372 Foot drop, left foot: Secondary | ICD-10-CM | POA: Diagnosis not present

## 2023-05-21 DIAGNOSIS — R2689 Other abnormalities of gait and mobility: Secondary | ICD-10-CM | POA: Diagnosis not present

## 2023-05-21 DIAGNOSIS — R531 Weakness: Secondary | ICD-10-CM | POA: Diagnosis not present

## 2023-05-21 DIAGNOSIS — R2681 Unsteadiness on feet: Secondary | ICD-10-CM | POA: Diagnosis not present

## 2023-05-25 DIAGNOSIS — M21372 Foot drop, left foot: Secondary | ICD-10-CM | POA: Diagnosis not present

## 2023-05-25 DIAGNOSIS — R2689 Other abnormalities of gait and mobility: Secondary | ICD-10-CM | POA: Diagnosis not present

## 2023-05-25 DIAGNOSIS — R531 Weakness: Secondary | ICD-10-CM | POA: Diagnosis not present

## 2023-05-25 DIAGNOSIS — R2681 Unsteadiness on feet: Secondary | ICD-10-CM | POA: Diagnosis not present

## 2023-05-28 DIAGNOSIS — M21372 Foot drop, left foot: Secondary | ICD-10-CM | POA: Diagnosis not present

## 2023-05-28 DIAGNOSIS — R2681 Unsteadiness on feet: Secondary | ICD-10-CM | POA: Diagnosis not present

## 2023-05-28 DIAGNOSIS — R531 Weakness: Secondary | ICD-10-CM | POA: Diagnosis not present

## 2023-05-28 DIAGNOSIS — R2689 Other abnormalities of gait and mobility: Secondary | ICD-10-CM | POA: Diagnosis not present

## 2023-06-01 DIAGNOSIS — R2689 Other abnormalities of gait and mobility: Secondary | ICD-10-CM | POA: Diagnosis not present

## 2023-06-01 DIAGNOSIS — R531 Weakness: Secondary | ICD-10-CM | POA: Diagnosis not present

## 2023-06-01 DIAGNOSIS — R2681 Unsteadiness on feet: Secondary | ICD-10-CM | POA: Diagnosis not present

## 2023-06-01 DIAGNOSIS — M21372 Foot drop, left foot: Secondary | ICD-10-CM | POA: Diagnosis not present

## 2023-06-03 DIAGNOSIS — R531 Weakness: Secondary | ICD-10-CM | POA: Diagnosis not present

## 2023-06-03 DIAGNOSIS — R2681 Unsteadiness on feet: Secondary | ICD-10-CM | POA: Diagnosis not present

## 2023-06-03 DIAGNOSIS — R2689 Other abnormalities of gait and mobility: Secondary | ICD-10-CM | POA: Diagnosis not present

## 2023-06-03 DIAGNOSIS — M21372 Foot drop, left foot: Secondary | ICD-10-CM | POA: Diagnosis not present

## 2023-06-08 DIAGNOSIS — R2681 Unsteadiness on feet: Secondary | ICD-10-CM | POA: Diagnosis not present

## 2023-06-08 DIAGNOSIS — R531 Weakness: Secondary | ICD-10-CM | POA: Diagnosis not present

## 2023-06-08 DIAGNOSIS — M21372 Foot drop, left foot: Secondary | ICD-10-CM | POA: Diagnosis not present

## 2023-06-08 DIAGNOSIS — R2689 Other abnormalities of gait and mobility: Secondary | ICD-10-CM | POA: Diagnosis not present

## 2023-06-10 DIAGNOSIS — R2689 Other abnormalities of gait and mobility: Secondary | ICD-10-CM | POA: Diagnosis not present

## 2023-06-10 DIAGNOSIS — R531 Weakness: Secondary | ICD-10-CM | POA: Diagnosis not present

## 2023-06-10 DIAGNOSIS — M21372 Foot drop, left foot: Secondary | ICD-10-CM | POA: Diagnosis not present

## 2023-06-10 DIAGNOSIS — R2681 Unsteadiness on feet: Secondary | ICD-10-CM | POA: Diagnosis not present

## 2023-06-19 DIAGNOSIS — M21372 Foot drop, left foot: Secondary | ICD-10-CM | POA: Diagnosis not present

## 2023-06-19 DIAGNOSIS — R2689 Other abnormalities of gait and mobility: Secondary | ICD-10-CM | POA: Diagnosis not present

## 2023-06-19 DIAGNOSIS — R2681 Unsteadiness on feet: Secondary | ICD-10-CM | POA: Diagnosis not present

## 2023-06-19 DIAGNOSIS — R531 Weakness: Secondary | ICD-10-CM | POA: Diagnosis not present

## 2023-06-22 DIAGNOSIS — E785 Hyperlipidemia, unspecified: Secondary | ICD-10-CM | POA: Diagnosis not present

## 2023-06-22 DIAGNOSIS — E1159 Type 2 diabetes mellitus with other circulatory complications: Secondary | ICD-10-CM | POA: Diagnosis not present

## 2023-06-22 DIAGNOSIS — E1169 Type 2 diabetes mellitus with other specified complication: Secondary | ICD-10-CM | POA: Diagnosis not present

## 2023-06-22 DIAGNOSIS — E291 Testicular hypofunction: Secondary | ICD-10-CM | POA: Diagnosis not present

## 2023-06-22 DIAGNOSIS — K219 Gastro-esophageal reflux disease without esophagitis: Secondary | ICD-10-CM | POA: Diagnosis not present

## 2023-06-22 DIAGNOSIS — M21372 Foot drop, left foot: Secondary | ICD-10-CM | POA: Diagnosis not present

## 2023-06-22 DIAGNOSIS — I5022 Chronic systolic (congestive) heart failure: Secondary | ICD-10-CM | POA: Diagnosis not present

## 2023-06-22 DIAGNOSIS — I25119 Atherosclerotic heart disease of native coronary artery with unspecified angina pectoris: Secondary | ICD-10-CM | POA: Diagnosis not present

## 2023-06-22 DIAGNOSIS — I152 Hypertension secondary to endocrine disorders: Secondary | ICD-10-CM | POA: Diagnosis not present

## 2023-06-22 DIAGNOSIS — Z0000 Encounter for general adult medical examination without abnormal findings: Secondary | ICD-10-CM | POA: Diagnosis not present

## 2023-06-22 DIAGNOSIS — E1151 Type 2 diabetes mellitus with diabetic peripheral angiopathy without gangrene: Secondary | ICD-10-CM | POA: Diagnosis not present

## 2023-06-22 DIAGNOSIS — E871 Hypo-osmolality and hyponatremia: Secondary | ICD-10-CM | POA: Diagnosis not present

## 2023-06-23 DIAGNOSIS — R531 Weakness: Secondary | ICD-10-CM | POA: Diagnosis not present

## 2023-06-23 DIAGNOSIS — M21372 Foot drop, left foot: Secondary | ICD-10-CM | POA: Diagnosis not present

## 2023-06-23 DIAGNOSIS — R2689 Other abnormalities of gait and mobility: Secondary | ICD-10-CM | POA: Diagnosis not present

## 2023-06-23 DIAGNOSIS — R2681 Unsteadiness on feet: Secondary | ICD-10-CM | POA: Diagnosis not present

## 2023-06-24 DIAGNOSIS — R531 Weakness: Secondary | ICD-10-CM | POA: Diagnosis not present

## 2023-06-24 DIAGNOSIS — R2681 Unsteadiness on feet: Secondary | ICD-10-CM | POA: Diagnosis not present

## 2023-06-24 DIAGNOSIS — R2689 Other abnormalities of gait and mobility: Secondary | ICD-10-CM | POA: Diagnosis not present

## 2023-06-24 DIAGNOSIS — M21372 Foot drop, left foot: Secondary | ICD-10-CM | POA: Diagnosis not present

## 2023-06-26 IMAGING — MR MR ABDOMEN WO/W CM MRCP
16 of 20 series · 37 of 48 positions shown · IV contrast (8 GADAVIST)
Comparison: 08/15/2020

CLINICAL DATA: Chronic pancreatitis, dilatation of the pancreatic
duct, renal cyst

EXAM:
MRI ABDOMEN WITHOUT AND WITH CONTRAST (INCLUDING MRCP)
TECHNIQUE: Multiplanar multisequence MR imaging of the abdomen was performed
both before and after the administration of intravenous contrast.
Heavily T2-weighted images of the biliary and pancreatic ducts were
obtained, and three-dimensional MRCP images were rendered by post
processing.
CONTRAST:  6mL GADAVIST GADOBUTROL 1 MMOL/ML IV SOLN

[Series 3: T2 fat-sat · axial · 6.0mm · 1.25mm/px · 1 of 36 slices shown]
[im 1/36]
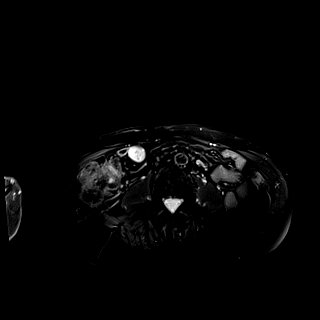

[Series 5: DWI · axial · 6.0mm · 1.49mm/px · z∈[-103,+149]mm · 2 of 71 slices shown (1 of 2)]
[im 1/71]
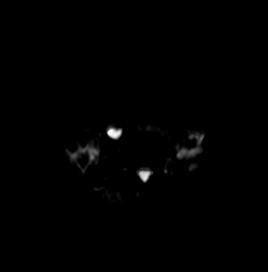
[im 71/71]
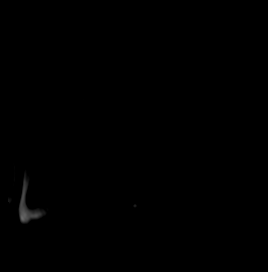

[Series 6: DWI · axial · 6.0mm · 1.49mm/px · 1 of 36 slices shown (2 of 2)]
[im 1/36]
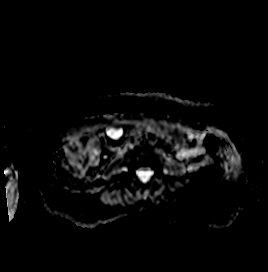

[Series 8: T2 · coronal · 6.0mm · 1.48mm/px · 1 of 30 slices shown (1 of 2)]
[im 1/30]
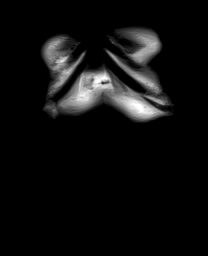

[Series 9: T1 · axial · 3.0mm · 1.25mm/px · z∈[-91,+146]mm · 3 of 80 slices shown (1 of 2)]
[im 1/80]
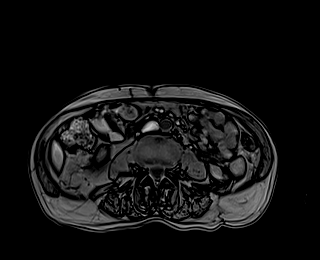
[im 40/80]
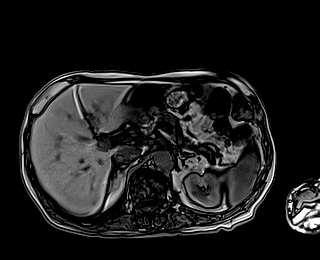
[im 80/80]
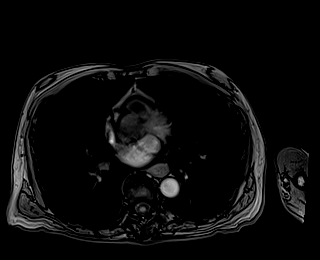

[Series 10: T1 · axial · 3.0mm · 1.25mm/px · z∈[-91,+146]mm · 3 of 80 slices shown (2 of 2)]
[im 1/80]
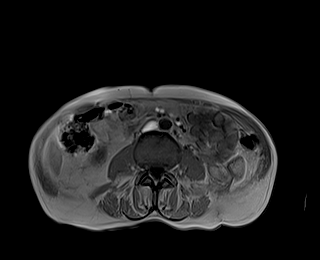
[im 40/80]
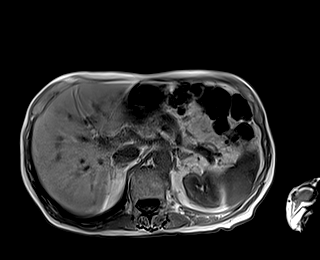
[im 80/80]
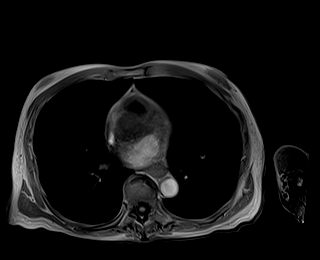

[Series 11: cor obl thk · sagittal · 50.0mm · 0.78mm/px · 1 of 9 slices shown]
[im 1/9]
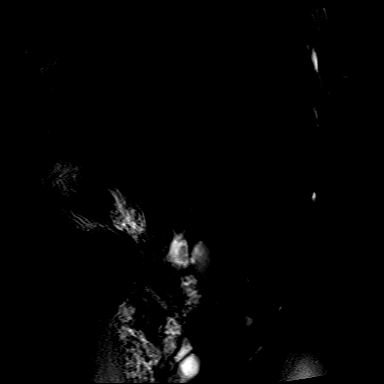

[Series 13: T2 · axial · 6.0mm · 1.56mm/px · 1 of 34 slices shown (2 of 2)]
[im 1/34]
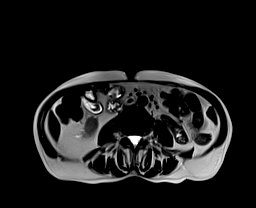

[Series 15: cor_3d_spc_trig · coronal · 1.0mm · 0.49mm/px · 3 of 80 slices shown]
[im 1/80]
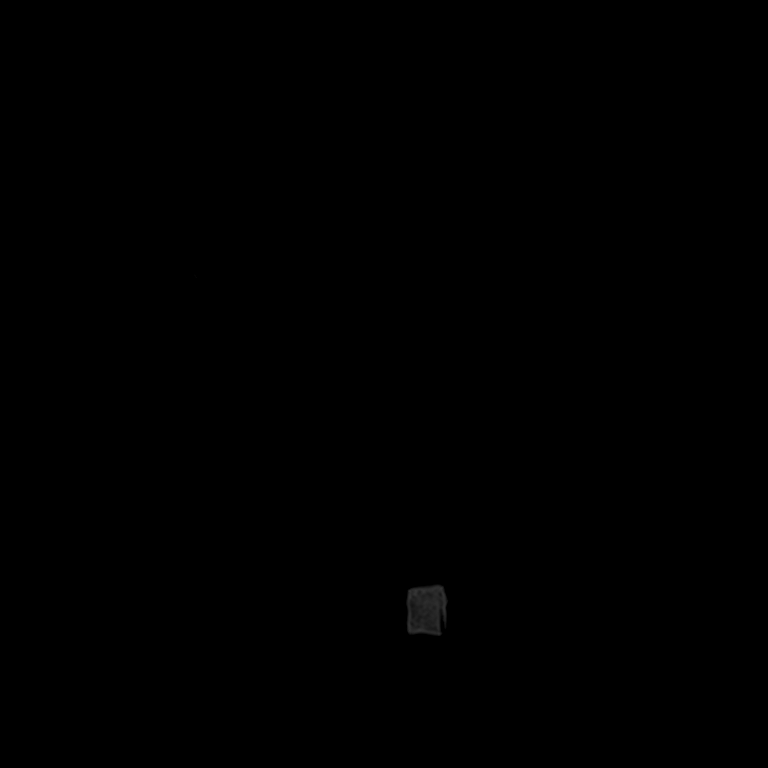
[im 40/80]
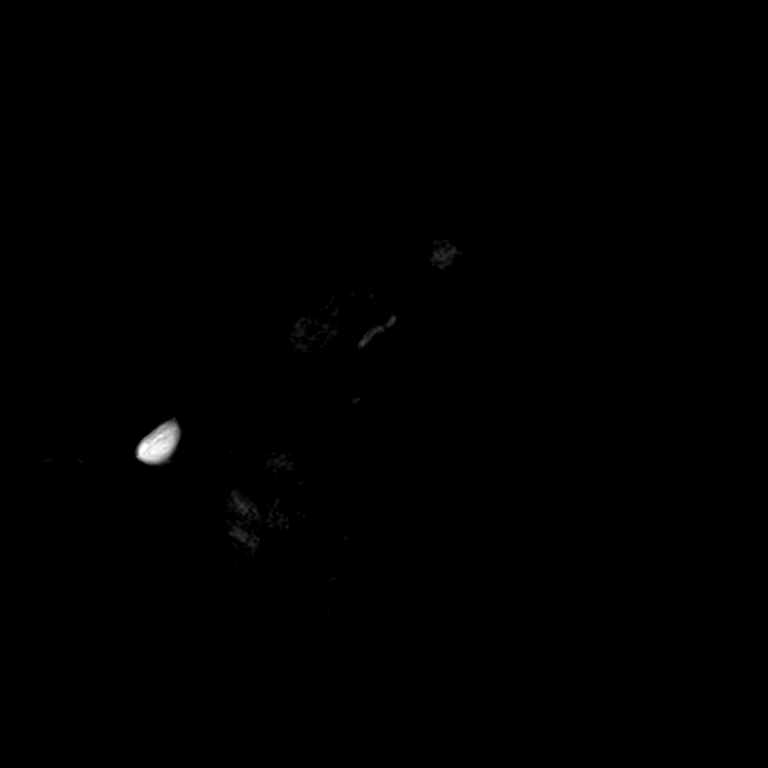
[im 80/80]
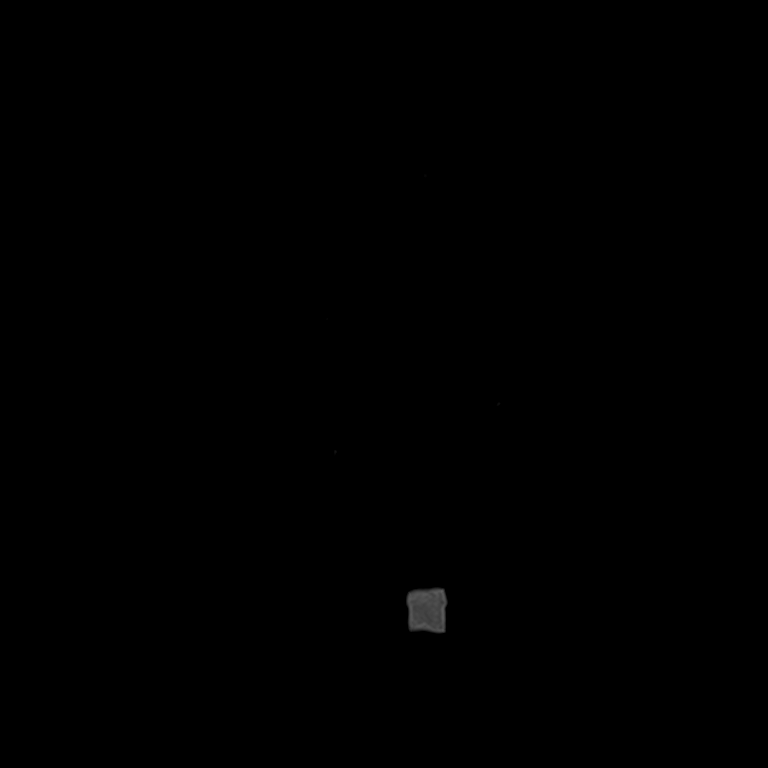

[Series 18: T1 dynamic · axial · 3.0mm · 1.25mm/px · z∈[-103,+134]mm · 3 of 80 slices shown (1 of 6)]
[im 1/80]
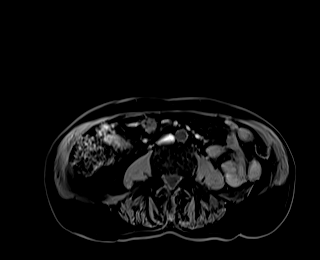
[im 40/80]
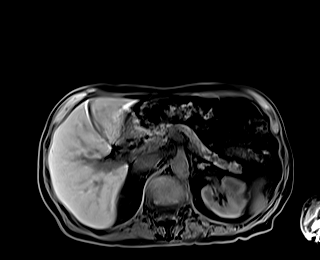
[im 80/80]
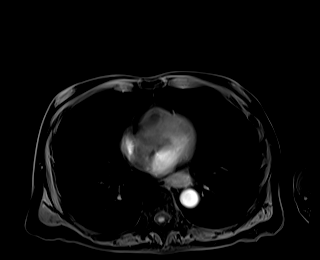

[Series 21: T1 dynamic · axial · 3.0mm · 1.25mm/px · z∈[-120,+117]mm · 3 of 80 slices shown (2 of 6)]
[im 1/80]
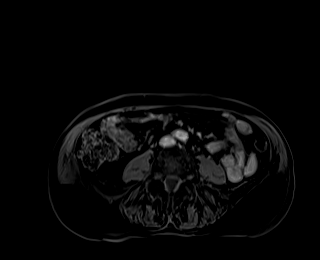
[im 40/80]
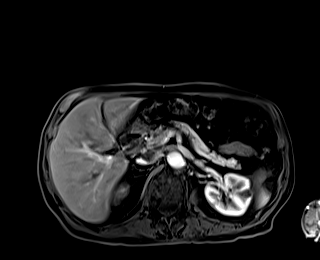
[im 80/80]
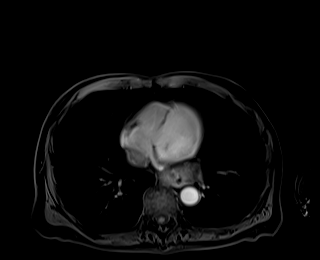

[Series 23: T1 dynamic · axial · 3.0mm · 1.25mm/px · z∈[-120,+117]mm · 3 of 80 slices shown (3 of 6)]
[im 1/80]
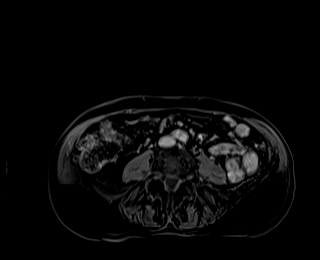
[im 40/80]
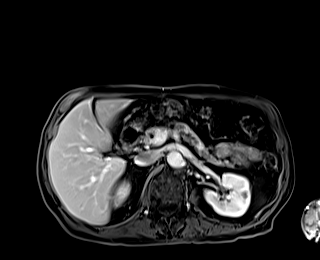
[im 80/80]
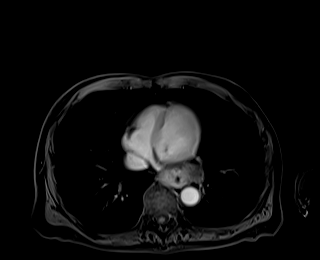

[Series 25: T1 dynamic · axial · 3.0mm · 1.25mm/px · z∈[-120,+117]mm · 3 of 80 slices shown (4 of 6)]
[im 1/80]
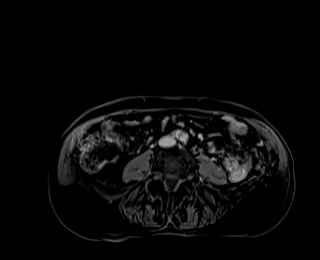
[im 40/80]
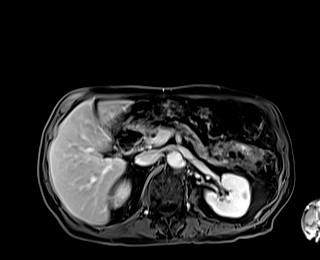
[im 80/80]
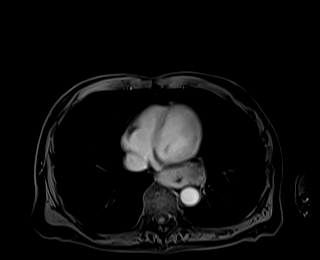

[Series 27: T1 dynamic · coronal · 3.0mm · 1.41mm/px · 3 of 72 slices shown (5 of 6)]
[im 1/72]
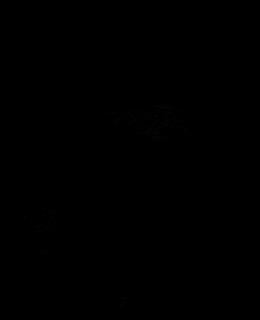
[im 36/72]
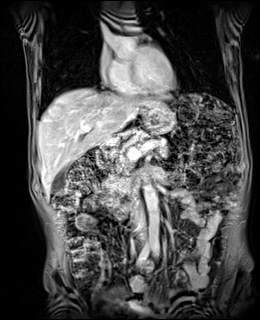
[im 72/72]
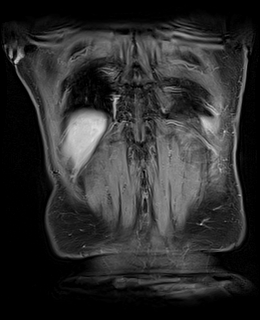

[Series 29: T1 dynamic · axial · 3.0mm · 1.25mm/px · z∈[-120,+117]mm · 3 of 80 slices shown (6 of 6)]
[im 1/80]
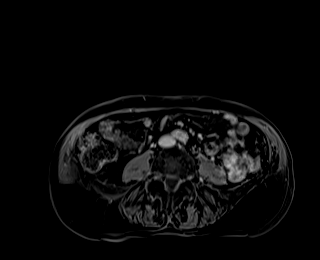
[im 40/80]
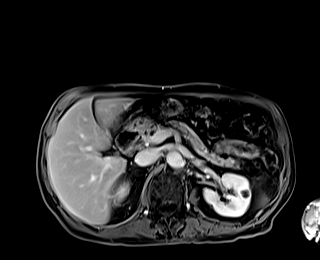
[im 80/80]
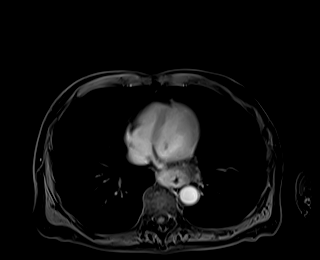

[Series 100: sub 20 sec · axial · 1.25mm/px · z∈[-111,+126]mm · 3 of 80 slices shown]
[im 1/80]
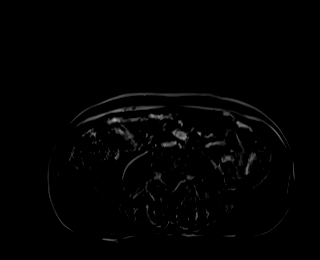
[im 40/80]
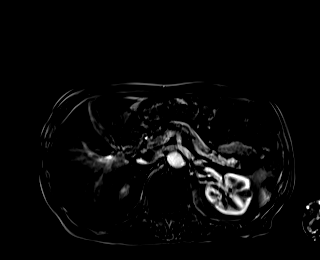
[im 80/80]
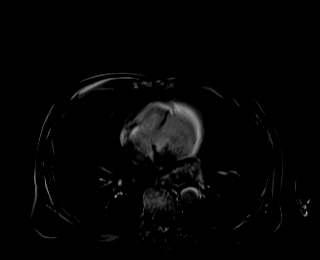

[37 of 48 positions shown; findings below may reference images not displayed]

FINDINGS: Lower chest: No acute findings. Moderate hiatal hernia with
intrathoracic position of the gastric fundus.

Hepatobiliary: No mass or other parenchymal abnormality identified.

Pancreas: No mass, inflammatory changes, or other parenchymal
abnormality identified. Unchanged minimal prominence of the
pancreatic duct in the central pancreatic head near the ampulla
measuring up to 0.5 cm. The remainder of the duct is normal in
caliber.

Spleen:  Within normal limits in size and appearance.

Adrenals/Urinary Tract: No masses identified. Multiple small
bilateral simple appearing renal cysts. A previously described
septation within an inferior pole cyst of the left kidney is not
appreciated on the current examination. No evidence of
hydronephrosis.

Stomach/Bowel: Visualized portions within the abdomen are
unremarkable.

Vascular/Lymphatic: No pathologically enlarged lymph nodes
identified. No abdominal aortic aneurysm demonstrated.

Other:  None.

Musculoskeletal: No suspicious bone lesions identified.
IMPRESSION: 1. Unchanged minimal prominence of the pancreatic duct in the
central pancreatic head near the ampulla measuring up to 0.5 cm. The
remainder of the duct is normal in caliber. This is of uncertain,
however doubtful clinical significance.
2. Multiple small bilateral simple appearing renal cysts. No
evidence of solid component or contrast enhancement. A previously
described septation within an inferior pole cyst of the left kidney
is not appreciated on current examination. No further follow-up or
characterization for these benign cysts is indicated by current exam
findings.
3. Moderate hiatal hernia.

## 2023-06-29 DIAGNOSIS — M21372 Foot drop, left foot: Secondary | ICD-10-CM | POA: Diagnosis not present

## 2023-06-29 DIAGNOSIS — R2689 Other abnormalities of gait and mobility: Secondary | ICD-10-CM | POA: Diagnosis not present

## 2023-06-29 DIAGNOSIS — R2681 Unsteadiness on feet: Secondary | ICD-10-CM | POA: Diagnosis not present

## 2023-06-29 DIAGNOSIS — R531 Weakness: Secondary | ICD-10-CM | POA: Diagnosis not present

## 2023-07-01 DIAGNOSIS — R2689 Other abnormalities of gait and mobility: Secondary | ICD-10-CM | POA: Diagnosis not present

## 2023-07-01 DIAGNOSIS — R531 Weakness: Secondary | ICD-10-CM | POA: Diagnosis not present

## 2023-07-01 DIAGNOSIS — R2681 Unsteadiness on feet: Secondary | ICD-10-CM | POA: Diagnosis not present

## 2023-07-01 DIAGNOSIS — M21372 Foot drop, left foot: Secondary | ICD-10-CM | POA: Diagnosis not present

## 2023-07-09 DIAGNOSIS — R2689 Other abnormalities of gait and mobility: Secondary | ICD-10-CM | POA: Diagnosis not present

## 2023-07-09 DIAGNOSIS — M21372 Foot drop, left foot: Secondary | ICD-10-CM | POA: Diagnosis not present

## 2023-07-09 DIAGNOSIS — R531 Weakness: Secondary | ICD-10-CM | POA: Diagnosis not present

## 2023-07-09 DIAGNOSIS — R2681 Unsteadiness on feet: Secondary | ICD-10-CM | POA: Diagnosis not present

## 2023-07-13 DIAGNOSIS — M21372 Foot drop, left foot: Secondary | ICD-10-CM | POA: Diagnosis not present

## 2023-07-13 DIAGNOSIS — R2681 Unsteadiness on feet: Secondary | ICD-10-CM | POA: Diagnosis not present

## 2023-07-13 DIAGNOSIS — R2689 Other abnormalities of gait and mobility: Secondary | ICD-10-CM | POA: Diagnosis not present

## 2023-07-13 DIAGNOSIS — R531 Weakness: Secondary | ICD-10-CM | POA: Diagnosis not present

## 2023-07-16 DIAGNOSIS — R2681 Unsteadiness on feet: Secondary | ICD-10-CM | POA: Diagnosis not present

## 2023-07-16 DIAGNOSIS — R2689 Other abnormalities of gait and mobility: Secondary | ICD-10-CM | POA: Diagnosis not present

## 2023-07-16 DIAGNOSIS — M21372 Foot drop, left foot: Secondary | ICD-10-CM | POA: Diagnosis not present

## 2023-07-16 DIAGNOSIS — R531 Weakness: Secondary | ICD-10-CM | POA: Diagnosis not present

## 2023-07-20 DIAGNOSIS — R2689 Other abnormalities of gait and mobility: Secondary | ICD-10-CM | POA: Diagnosis not present

## 2023-07-20 DIAGNOSIS — M21372 Foot drop, left foot: Secondary | ICD-10-CM | POA: Diagnosis not present

## 2023-07-20 DIAGNOSIS — R2681 Unsteadiness on feet: Secondary | ICD-10-CM | POA: Diagnosis not present

## 2023-07-20 DIAGNOSIS — R531 Weakness: Secondary | ICD-10-CM | POA: Diagnosis not present

## 2023-07-24 DIAGNOSIS — M21372 Foot drop, left foot: Secondary | ICD-10-CM | POA: Diagnosis not present

## 2023-07-24 DIAGNOSIS — R2681 Unsteadiness on feet: Secondary | ICD-10-CM | POA: Diagnosis not present

## 2023-07-24 DIAGNOSIS — R531 Weakness: Secondary | ICD-10-CM | POA: Diagnosis not present

## 2023-07-24 DIAGNOSIS — R2689 Other abnormalities of gait and mobility: Secondary | ICD-10-CM | POA: Diagnosis not present

## 2023-07-27 DIAGNOSIS — M21372 Foot drop, left foot: Secondary | ICD-10-CM | POA: Diagnosis not present

## 2023-07-27 DIAGNOSIS — R2681 Unsteadiness on feet: Secondary | ICD-10-CM | POA: Diagnosis not present

## 2023-07-27 DIAGNOSIS — R531 Weakness: Secondary | ICD-10-CM | POA: Diagnosis not present

## 2023-07-27 DIAGNOSIS — R2689 Other abnormalities of gait and mobility: Secondary | ICD-10-CM | POA: Diagnosis not present

## 2023-07-29 DIAGNOSIS — R2689 Other abnormalities of gait and mobility: Secondary | ICD-10-CM | POA: Diagnosis not present

## 2023-07-29 DIAGNOSIS — R2681 Unsteadiness on feet: Secondary | ICD-10-CM | POA: Diagnosis not present

## 2023-07-29 DIAGNOSIS — M21372 Foot drop, left foot: Secondary | ICD-10-CM | POA: Diagnosis not present

## 2023-07-29 DIAGNOSIS — R531 Weakness: Secondary | ICD-10-CM | POA: Diagnosis not present

## 2023-08-03 DIAGNOSIS — M21372 Foot drop, left foot: Secondary | ICD-10-CM | POA: Diagnosis not present

## 2023-08-03 DIAGNOSIS — R2681 Unsteadiness on feet: Secondary | ICD-10-CM | POA: Diagnosis not present

## 2023-08-03 DIAGNOSIS — R2689 Other abnormalities of gait and mobility: Secondary | ICD-10-CM | POA: Diagnosis not present

## 2023-08-03 DIAGNOSIS — R531 Weakness: Secondary | ICD-10-CM | POA: Diagnosis not present

## 2023-08-05 DIAGNOSIS — M21372 Foot drop, left foot: Secondary | ICD-10-CM | POA: Diagnosis not present

## 2023-08-05 DIAGNOSIS — R2681 Unsteadiness on feet: Secondary | ICD-10-CM | POA: Diagnosis not present

## 2023-08-05 DIAGNOSIS — R2689 Other abnormalities of gait and mobility: Secondary | ICD-10-CM | POA: Diagnosis not present

## 2023-08-05 DIAGNOSIS — R531 Weakness: Secondary | ICD-10-CM | POA: Diagnosis not present

## 2023-08-10 DIAGNOSIS — R531 Weakness: Secondary | ICD-10-CM | POA: Diagnosis not present

## 2023-08-10 DIAGNOSIS — R2681 Unsteadiness on feet: Secondary | ICD-10-CM | POA: Diagnosis not present

## 2023-08-10 DIAGNOSIS — R2689 Other abnormalities of gait and mobility: Secondary | ICD-10-CM | POA: Diagnosis not present

## 2023-08-10 DIAGNOSIS — M21372 Foot drop, left foot: Secondary | ICD-10-CM | POA: Diagnosis not present

## 2023-08-12 DIAGNOSIS — R2689 Other abnormalities of gait and mobility: Secondary | ICD-10-CM | POA: Diagnosis not present

## 2023-08-12 DIAGNOSIS — R531 Weakness: Secondary | ICD-10-CM | POA: Diagnosis not present

## 2023-08-12 DIAGNOSIS — M21372 Foot drop, left foot: Secondary | ICD-10-CM | POA: Diagnosis not present

## 2023-08-12 DIAGNOSIS — R2681 Unsteadiness on feet: Secondary | ICD-10-CM | POA: Diagnosis not present

## 2023-08-25 ENCOUNTER — Encounter: Payer: Self-pay | Admitting: Gastroenterology

## 2023-10-21 DIAGNOSIS — M21372 Foot drop, left foot: Secondary | ICD-10-CM | POA: Diagnosis not present

## 2023-10-21 DIAGNOSIS — I255 Ischemic cardiomyopathy: Secondary | ICD-10-CM | POA: Diagnosis not present

## 2023-10-21 DIAGNOSIS — K219 Gastro-esophageal reflux disease without esophagitis: Secondary | ICD-10-CM | POA: Diagnosis not present

## 2023-10-21 DIAGNOSIS — Z1330 Encounter for screening examination for mental health and behavioral disorders, unspecified: Secondary | ICD-10-CM | POA: Diagnosis not present

## 2023-10-21 DIAGNOSIS — I2510 Atherosclerotic heart disease of native coronary artery without angina pectoris: Secondary | ICD-10-CM | POA: Diagnosis not present

## 2023-10-21 DIAGNOSIS — I5022 Chronic systolic (congestive) heart failure: Secondary | ICD-10-CM | POA: Diagnosis not present

## 2023-10-21 DIAGNOSIS — E871 Hypo-osmolality and hyponatremia: Secondary | ICD-10-CM | POA: Diagnosis not present

## 2023-10-21 DIAGNOSIS — E1151 Type 2 diabetes mellitus with diabetic peripheral angiopathy without gangrene: Secondary | ICD-10-CM | POA: Diagnosis not present

## 2023-10-21 DIAGNOSIS — E538 Deficiency of other specified B group vitamins: Secondary | ICD-10-CM | POA: Diagnosis not present

## 2023-10-21 DIAGNOSIS — E291 Testicular hypofunction: Secondary | ICD-10-CM | POA: Diagnosis not present

## 2023-12-08 DIAGNOSIS — I2510 Atherosclerotic heart disease of native coronary artery without angina pectoris: Secondary | ICD-10-CM | POA: Diagnosis not present

## 2023-12-08 DIAGNOSIS — E1159 Type 2 diabetes mellitus with other circulatory complications: Secondary | ICD-10-CM | POA: Diagnosis not present

## 2023-12-08 DIAGNOSIS — I5022 Chronic systolic (congestive) heart failure: Secondary | ICD-10-CM | POA: Diagnosis not present

## 2023-12-08 DIAGNOSIS — I255 Ischemic cardiomyopathy: Secondary | ICD-10-CM | POA: Diagnosis not present

## 2023-12-08 DIAGNOSIS — I25119 Atherosclerotic heart disease of native coronary artery with unspecified angina pectoris: Secondary | ICD-10-CM | POA: Diagnosis not present

## 2023-12-08 DIAGNOSIS — I152 Hypertension secondary to endocrine disorders: Secondary | ICD-10-CM | POA: Diagnosis not present

## 2024-02-11 DIAGNOSIS — I25119 Atherosclerotic heart disease of native coronary artery with unspecified angina pectoris: Secondary | ICD-10-CM | POA: Diagnosis not present

## 2024-02-11 DIAGNOSIS — E1159 Type 2 diabetes mellitus with other circulatory complications: Secondary | ICD-10-CM | POA: Diagnosis not present

## 2024-02-11 DIAGNOSIS — I2510 Atherosclerotic heart disease of native coronary artery without angina pectoris: Secondary | ICD-10-CM | POA: Diagnosis not present

## 2024-02-11 DIAGNOSIS — I255 Ischemic cardiomyopathy: Secondary | ICD-10-CM | POA: Diagnosis not present

## 2024-02-11 DIAGNOSIS — I152 Hypertension secondary to endocrine disorders: Secondary | ICD-10-CM | POA: Diagnosis not present

## 2024-02-11 DIAGNOSIS — I5022 Chronic systolic (congestive) heart failure: Secondary | ICD-10-CM | POA: Diagnosis not present

## 2024-02-18 DIAGNOSIS — I152 Hypertension secondary to endocrine disorders: Secondary | ICD-10-CM | POA: Diagnosis not present

## 2024-02-18 DIAGNOSIS — I2510 Atherosclerotic heart disease of native coronary artery without angina pectoris: Secondary | ICD-10-CM | POA: Diagnosis not present

## 2024-02-18 DIAGNOSIS — E1159 Type 2 diabetes mellitus with other circulatory complications: Secondary | ICD-10-CM | POA: Diagnosis not present

## 2024-02-18 DIAGNOSIS — B351 Tinea unguium: Secondary | ICD-10-CM | POA: Diagnosis not present

## 2024-02-18 DIAGNOSIS — R7989 Other specified abnormal findings of blood chemistry: Secondary | ICD-10-CM | POA: Diagnosis not present

## 2024-02-18 DIAGNOSIS — I5022 Chronic systolic (congestive) heart failure: Secondary | ICD-10-CM | POA: Diagnosis not present

## 2024-02-18 DIAGNOSIS — E1151 Type 2 diabetes mellitus with diabetic peripheral angiopathy without gangrene: Secondary | ICD-10-CM | POA: Diagnosis not present

## 2024-02-18 DIAGNOSIS — E871 Hypo-osmolality and hyponatremia: Secondary | ICD-10-CM | POA: Diagnosis not present

## 2024-02-18 DIAGNOSIS — L84 Corns and callosities: Secondary | ICD-10-CM | POA: Diagnosis not present

## 2024-02-18 DIAGNOSIS — M21372 Foot drop, left foot: Secondary | ICD-10-CM | POA: Diagnosis not present

## 2024-02-18 DIAGNOSIS — I25119 Atherosclerotic heart disease of native coronary artery with unspecified angina pectoris: Secondary | ICD-10-CM | POA: Diagnosis not present

## 2024-03-11 DIAGNOSIS — I152 Hypertension secondary to endocrine disorders: Secondary | ICD-10-CM | POA: Diagnosis not present

## 2024-03-11 DIAGNOSIS — E1159 Type 2 diabetes mellitus with other circulatory complications: Secondary | ICD-10-CM | POA: Diagnosis not present

## 2024-03-11 DIAGNOSIS — I739 Peripheral vascular disease, unspecified: Secondary | ICD-10-CM | POA: Diagnosis not present

## 2024-03-11 DIAGNOSIS — S90521A Blister (nonthermal), right ankle, initial encounter: Secondary | ICD-10-CM | POA: Diagnosis not present

## 2024-03-11 DIAGNOSIS — L84 Corns and callosities: Secondary | ICD-10-CM | POA: Diagnosis not present

## 2024-03-11 DIAGNOSIS — B351 Tinea unguium: Secondary | ICD-10-CM | POA: Diagnosis not present

## 2024-03-16 DIAGNOSIS — E119 Type 2 diabetes mellitus without complications: Secondary | ICD-10-CM | POA: Diagnosis not present

## 2024-03-16 DIAGNOSIS — H52223 Regular astigmatism, bilateral: Secondary | ICD-10-CM | POA: Diagnosis not present

## 2024-03-16 DIAGNOSIS — Z0100 Encounter for examination of eyes and vision without abnormal findings: Secondary | ICD-10-CM | POA: Diagnosis not present

## 2024-04-25 DIAGNOSIS — I739 Peripheral vascular disease, unspecified: Secondary | ICD-10-CM | POA: Diagnosis not present

## 2024-06-21 DIAGNOSIS — I152 Hypertension secondary to endocrine disorders: Secondary | ICD-10-CM | POA: Diagnosis not present

## 2024-06-21 DIAGNOSIS — B351 Tinea unguium: Secondary | ICD-10-CM | POA: Diagnosis not present

## 2024-06-21 DIAGNOSIS — I739 Peripheral vascular disease, unspecified: Secondary | ICD-10-CM | POA: Diagnosis not present

## 2024-06-21 DIAGNOSIS — E1159 Type 2 diabetes mellitus with other circulatory complications: Secondary | ICD-10-CM | POA: Diagnosis not present
